# Patient Record
Sex: Female | Born: 1970 | Race: White | Hispanic: No | Marital: Married | Smoking: Never smoker
Health system: Southern US, Community
[De-identification: ages and names within clinical notes are randomized; demographics above are authoritative.]

## PROBLEM LIST (undated history)

## (undated) DIAGNOSIS — Z85528 Personal history of other malignant neoplasm of kidney: Secondary | ICD-10-CM

## (undated) DIAGNOSIS — C73 Malignant neoplasm of thyroid gland: Secondary | ICD-10-CM

## (undated) DIAGNOSIS — I10 Essential (primary) hypertension: Secondary | ICD-10-CM

## (undated) DIAGNOSIS — K219 Gastro-esophageal reflux disease without esophagitis: Secondary | ICD-10-CM

## (undated) DIAGNOSIS — E282 Polycystic ovarian syndrome: Secondary | ICD-10-CM

## (undated) DIAGNOSIS — N289 Disorder of kidney and ureter, unspecified: Secondary | ICD-10-CM

## (undated) DIAGNOSIS — N200 Calculus of kidney: Secondary | ICD-10-CM

## (undated) DIAGNOSIS — E079 Disorder of thyroid, unspecified: Secondary | ICD-10-CM

## (undated) DIAGNOSIS — C801 Malignant (primary) neoplasm, unspecified: Secondary | ICD-10-CM

## (undated) HISTORY — PX: PARTIAL NEPHRECTOMY: SHX414

## (undated) HISTORY — PX: ABDOMINAL HYSTERECTOMY: SHX81

## (undated) HISTORY — PX: CHOLECYSTECTOMY: SHX55

## (undated) HISTORY — PX: TONSILLECTOMY: SUR1361

## (undated) HISTORY — PX: OTHER SURGICAL HISTORY: SHX169

---

## 2006-11-24 ENCOUNTER — Emergency Department (HOSPITAL_COMMUNITY): Admission: EM | Admit: 2006-11-24 | Discharge: 2006-11-25 | Payer: Self-pay | Admitting: Emergency Medicine

## 2006-12-23 ENCOUNTER — Emergency Department (HOSPITAL_COMMUNITY): Admission: EM | Admit: 2006-12-23 | Discharge: 2006-12-24 | Payer: Self-pay | Admitting: *Deleted

## 2006-12-27 ENCOUNTER — Emergency Department (HOSPITAL_COMMUNITY): Admission: EM | Admit: 2006-12-27 | Discharge: 2006-12-27 | Payer: Self-pay | Admitting: *Deleted

## 2007-01-16 ENCOUNTER — Emergency Department (HOSPITAL_COMMUNITY): Admission: EM | Admit: 2007-01-16 | Discharge: 2007-01-16 | Payer: Self-pay | Admitting: Emergency Medicine

## 2007-02-05 ENCOUNTER — Emergency Department (HOSPITAL_COMMUNITY): Admission: EM | Admit: 2007-02-05 | Discharge: 2007-02-05 | Payer: Self-pay | Admitting: Emergency Medicine

## 2007-04-04 ENCOUNTER — Encounter (HOSPITAL_COMMUNITY): Payer: Self-pay | Admitting: Obstetrics and Gynecology

## 2007-04-04 ENCOUNTER — Inpatient Hospital Stay (HOSPITAL_COMMUNITY): Admission: RE | Admit: 2007-04-04 | Discharge: 2007-04-05 | Payer: Self-pay | Admitting: Obstetrics and Gynecology

## 2008-05-22 HISTORY — PX: TUBAL LIGATION: SHX77

## 2009-07-02 ENCOUNTER — Ambulatory Visit: Payer: Self-pay | Admitting: Diagnostic Radiology

## 2009-07-02 ENCOUNTER — Emergency Department (HOSPITAL_BASED_OUTPATIENT_CLINIC_OR_DEPARTMENT_OTHER): Admission: EM | Admit: 2009-07-02 | Discharge: 2009-07-02 | Payer: Self-pay | Admitting: Emergency Medicine

## 2009-11-10 ENCOUNTER — Ambulatory Visit: Payer: Self-pay | Admitting: Diagnostic Radiology

## 2009-11-10 ENCOUNTER — Emergency Department (HOSPITAL_BASED_OUTPATIENT_CLINIC_OR_DEPARTMENT_OTHER): Admission: EM | Admit: 2009-11-10 | Discharge: 2009-11-10 | Payer: Self-pay | Admitting: Emergency Medicine

## 2009-11-26 ENCOUNTER — Emergency Department (HOSPITAL_BASED_OUTPATIENT_CLINIC_OR_DEPARTMENT_OTHER): Admission: EM | Admit: 2009-11-26 | Discharge: 2009-11-26 | Payer: Self-pay | Admitting: Emergency Medicine

## 2009-11-26 ENCOUNTER — Ambulatory Visit: Payer: Self-pay | Admitting: Obstetrics and Gynecology

## 2009-11-26 ENCOUNTER — Inpatient Hospital Stay (HOSPITAL_COMMUNITY): Admission: AD | Admit: 2009-11-26 | Discharge: 2009-11-27 | Payer: Self-pay | Admitting: Obstetrics & Gynecology

## 2009-12-22 ENCOUNTER — Emergency Department (HOSPITAL_BASED_OUTPATIENT_CLINIC_OR_DEPARTMENT_OTHER): Admission: EM | Admit: 2009-12-22 | Discharge: 2009-12-22 | Payer: Self-pay | Admitting: Emergency Medicine

## 2009-12-22 ENCOUNTER — Ambulatory Visit: Payer: Self-pay | Admitting: Diagnostic Radiology

## 2010-01-18 ENCOUNTER — Inpatient Hospital Stay (HOSPITAL_COMMUNITY): Admission: AD | Admit: 2010-01-18 | Discharge: 2010-01-18 | Payer: Self-pay | Admitting: Gynecology

## 2010-01-18 ENCOUNTER — Ambulatory Visit: Payer: Self-pay | Admitting: Nurse Practitioner

## 2010-03-06 ENCOUNTER — Emergency Department (HOSPITAL_BASED_OUTPATIENT_CLINIC_OR_DEPARTMENT_OTHER): Admission: EM | Admit: 2010-03-06 | Discharge: 2010-03-06 | Payer: Self-pay | Admitting: Emergency Medicine

## 2010-08-03 LAB — URINALYSIS, ROUTINE W REFLEX MICROSCOPIC
Glucose, UA: NEGATIVE mg/dL
Hgb urine dipstick: NEGATIVE
Ketones, ur: NEGATIVE mg/dL
Nitrite: NEGATIVE
Protein, ur: NEGATIVE mg/dL
Specific Gravity, Urine: 1.024 (ref 1.005–1.030)
Urobilinogen, UA: 0.2 mg/dL (ref 0.0–1.0)
pH: 5.5 (ref 5.0–8.0)

## 2010-08-03 LAB — CBC
MCHC: 33.5 g/dL (ref 30.0–36.0)
WBC: 8.1 10*3/uL (ref 4.0–10.5)

## 2010-08-03 LAB — DIFFERENTIAL
Basophils Relative: 2 % — ABNORMAL HIGH (ref 0–1)
Lymphocytes Relative: 20 % (ref 12–46)
Lymphs Abs: 1.6 10*3/uL (ref 0.7–4.0)
Monocytes Absolute: 0.8 10*3/uL (ref 0.1–1.0)
Monocytes Relative: 10 % (ref 3–12)
Neutro Abs: 5.3 10*3/uL (ref 1.7–7.7)

## 2010-08-03 LAB — BASIC METABOLIC PANEL
BUN: 15 mg/dL (ref 6–23)
CO2: 27 mEq/L (ref 19–32)
Calcium: 9.3 mg/dL (ref 8.4–10.5)
Chloride: 103 mEq/L (ref 96–112)

## 2010-08-05 LAB — CBC
HCT: 37.3 % (ref 36.0–46.0)
HCT: 40.6 % (ref 36.0–46.0)
MCH: 28.6 pg (ref 26.0–34.0)
MCHC: 33.6 g/dL (ref 30.0–36.0)
MCHC: 33.7 g/dL (ref 30.0–36.0)
Platelets: 367 10*3/uL (ref 150–400)
RBC: 4.4 MIL/uL (ref 3.87–5.11)
RBC: 4.84 MIL/uL (ref 3.87–5.11)
RDW: 12.7 % (ref 11.5–15.5)
WBC: 11 10*3/uL — ABNORMAL HIGH (ref 4.0–10.5)

## 2010-08-05 LAB — COMPREHENSIVE METABOLIC PANEL
ALT: 55 U/L — ABNORMAL HIGH (ref 0–35)
AST: 34 U/L (ref 0–37)
Albumin: 4.5 g/dL (ref 3.5–5.2)
BUN: 16 mg/dL (ref 6–23)
CO2: 24 mEq/L (ref 19–32)
Calcium: 9.7 mg/dL (ref 8.4–10.5)
GFR calc Af Amer: >60 mL/min (ref >60)
GFR calc non Af Amer: >60 mL/min (ref >60)
Potassium: 3.9 mEq/L (ref 3.5–5.1)
Sodium: 141 mEq/L (ref 135–145)

## 2010-08-05 LAB — DIFFERENTIAL
Lymphocytes Relative: 22 % (ref 12–46)
Lymphs Abs: 3 10*3/uL (ref 0.7–4.0)
Monocytes Absolute: 0.8 10*3/uL (ref 0.1–1.0)
Monocytes Relative: 5 % (ref 3–12)
Neutro Abs: 10 10*3/uL — ABNORMAL HIGH (ref 1.7–7.7)
Neutrophils Relative %: 71 % (ref 43–77)

## 2010-08-05 LAB — URINALYSIS, ROUTINE W REFLEX MICROSCOPIC
Bilirubin Urine: NEGATIVE
Glucose, UA: NEGATIVE mg/dL
Hgb urine dipstick: NEGATIVE
Hgb urine dipstick: NEGATIVE
Ketones, ur: NEGATIVE mg/dL
Nitrite: NEGATIVE
Protein, ur: NEGATIVE mg/dL
Protein, ur: NEGATIVE mg/dL
Urobilinogen, UA: 0.2 mg/dL (ref 0.0–1.0)
pH: 5.5 (ref 5.0–8.0)

## 2010-08-07 LAB — COMPREHENSIVE METABOLIC PANEL
BUN: 10 mg/dL (ref 6–23)
Calcium: 9.3 mg/dL (ref 8.4–10.5)
GFR calc non Af Amer: >60 mL/min (ref >60)
Glucose, Bld: 95 mg/dL (ref 70–99)
Total Protein: 7.8 g/dL (ref 6.0–8.3)

## 2010-08-07 LAB — LIPASE, BLOOD: Lipase: 51 U/L (ref 23–300)

## 2010-08-07 LAB — CBC
HCT: 36.3 % (ref 36.0–46.0)
HCT: 38.8 % (ref 36.0–46.0)
Hemoglobin: 12.3 g/dL (ref 12.0–15.0)
MCH: 28.4 pg (ref 26.0–34.0)
MCH: 27.9 pg (ref 26.0–34.0)
MCHC: 33.8 g/dL (ref 30.0–36.0)
MCHC: 33 g/dL (ref 30.0–36.0)
MCV: 84 fL (ref 78.0–100.0)
MCV: 84.3 fL (ref 78.0–100.0)
Platelets: 322 10*3/uL (ref 150–400)
RBC: 4.32 MIL/uL (ref 3.87–5.11)
RDW: 12.4 % (ref 11.5–15.5)
WBC: 10.5 10*3/uL (ref 4.0–10.5)

## 2010-08-07 LAB — URINALYSIS, ROUTINE W REFLEX MICROSCOPIC
Bilirubin Urine: NEGATIVE
Glucose, UA: NEGATIVE mg/dL
Glucose, UA: NEGATIVE mg/dL
Hgb urine dipstick: NEGATIVE
Ketones, ur: NEGATIVE mg/dL
Nitrite: NEGATIVE
Protein, ur: NEGATIVE mg/dL
Protein, ur: NEGATIVE mg/dL
Urobilinogen, UA: 0.2 mg/dL (ref 0.0–1.0)
pH: 5.5 (ref 5.0–8.0)

## 2010-08-07 LAB — URINE CULTURE: Colony Count: 65000

## 2010-08-07 LAB — BASIC METABOLIC PANEL
BUN: 11 mg/dL (ref 6–23)
CO2: 29 mEq/L (ref 19–32)
Creatinine, Ser: 0.6 mg/dL (ref 0.4–1.2)
GFR calc Af Amer: >60 mL/min (ref >60)

## 2010-08-07 LAB — URINE MICROSCOPIC-ADD ON

## 2010-08-07 LAB — DIFFERENTIAL
Basophils Absolute: 0 10*3/uL (ref 0.0–0.1)
Basophils Relative: 2 % — ABNORMAL HIGH (ref 0–1)
Lymphs Abs: 2.5 10*3/uL (ref 0.7–4.0)
Monocytes Absolute: 0.7 10*3/uL (ref 0.1–1.0)
Monocytes Relative: 6 % (ref 3–12)
Monocytes Relative: 6 % (ref 3–12)
Neutro Abs: 7.2 10*3/uL (ref 1.7–7.7)
Neutro Abs: 7 10*3/uL (ref 1.7–7.7)
Neutrophils Relative %: 68 % (ref 43–77)
Neutrophils Relative %: 65 % (ref 43–77)

## 2010-08-07 LAB — WET PREP, GENITAL
Trich, Wet Prep: NONE SEEN
Yeast Wet Prep HPF POC: NONE SEEN

## 2010-08-11 LAB — URINALYSIS, ROUTINE W REFLEX MICROSCOPIC
Glucose, UA: NEGATIVE mg/dL
Ketones, ur: NEGATIVE mg/dL
Nitrite: NEGATIVE
Protein, ur: NEGATIVE mg/dL
Urobilinogen, UA: 0.2 mg/dL (ref 0.0–1.0)

## 2010-09-11 ENCOUNTER — Emergency Department (HOSPITAL_BASED_OUTPATIENT_CLINIC_OR_DEPARTMENT_OTHER)
Admission: EM | Admit: 2010-09-11 | Discharge: 2010-09-11 | Disposition: A | Discharge status: Home / Self Care | Payer: Self-pay | Attending: Emergency Medicine | Admitting: Emergency Medicine

## 2010-09-11 DIAGNOSIS — R300 Dysuria: Secondary | ICD-10-CM | POA: Insufficient documentation

## 2010-09-11 DIAGNOSIS — I10 Essential (primary) hypertension: Secondary | ICD-10-CM | POA: Insufficient documentation

## 2010-09-11 DIAGNOSIS — N390 Urinary tract infection, site not specified: Secondary | ICD-10-CM | POA: Insufficient documentation

## 2010-09-11 DIAGNOSIS — D72829 Elevated white blood cell count, unspecified: Secondary | ICD-10-CM | POA: Insufficient documentation

## 2010-09-11 DIAGNOSIS — K219 Gastro-esophageal reflux disease without esophagitis: Secondary | ICD-10-CM | POA: Insufficient documentation

## 2010-09-11 LAB — BASIC METABOLIC PANEL
BUN: 13 mg/dL (ref 6–23)
CO2: 26 mEq/L (ref 19–32)
Calcium: 9.4 mg/dL (ref 8.4–10.5)
GFR calc non Af Amer: >60 mL/min (ref >60)
Glucose, Bld: 135 mg/dL — ABNORMAL HIGH (ref 70–99)
Sodium: 141 mEq/L (ref 135–145)

## 2010-09-11 LAB — CBC
HCT: 37.3 % (ref 36.0–46.0)
Hemoglobin: 12.8 g/dL (ref 12.0–15.0)
MCHC: 34.3 g/dL (ref 30.0–36.0)
MCV: 80.7 fL (ref 78.0–100.0)
RDW: 13.2 % (ref 11.5–15.5)

## 2010-09-11 LAB — URINE MICROSCOPIC-ADD ON

## 2010-09-11 LAB — URINALYSIS, ROUTINE W REFLEX MICROSCOPIC
Bilirubin Urine: NEGATIVE
Ketones, ur: NEGATIVE mg/dL
Nitrite: NEGATIVE
Protein, ur: NEGATIVE mg/dL
Urobilinogen, UA: 0.2 mg/dL (ref 0.0–1.0)

## 2010-09-20 ENCOUNTER — Emergency Department (INDEPENDENT_AMBULATORY_CARE_PROVIDER_SITE_OTHER): Payer: Self-pay

## 2010-09-20 ENCOUNTER — Emergency Department (HOSPITAL_BASED_OUTPATIENT_CLINIC_OR_DEPARTMENT_OTHER)
Admission: EM | Admit: 2010-09-20 | Discharge: 2010-09-21 | Disposition: A | Discharge status: Home / Self Care | Payer: Self-pay | Attending: Emergency Medicine | Admitting: Emergency Medicine

## 2010-09-20 DIAGNOSIS — R109 Unspecified abdominal pain: Secondary | ICD-10-CM

## 2010-09-20 DIAGNOSIS — C649 Malignant neoplasm of unspecified kidney, except renal pelvis: Secondary | ICD-10-CM

## 2010-09-20 DIAGNOSIS — I10 Essential (primary) hypertension: Secondary | ICD-10-CM | POA: Insufficient documentation

## 2010-09-20 DIAGNOSIS — K7689 Other specified diseases of liver: Secondary | ICD-10-CM | POA: Insufficient documentation

## 2010-09-20 DIAGNOSIS — R748 Abnormal levels of other serum enzymes: Secondary | ICD-10-CM | POA: Insufficient documentation

## 2010-09-20 DIAGNOSIS — K219 Gastro-esophageal reflux disease without esophagitis: Secondary | ICD-10-CM | POA: Insufficient documentation

## 2010-09-20 LAB — COMPREHENSIVE METABOLIC PANEL
ALT: 65 U/L — ABNORMAL HIGH (ref 0–35)
Alkaline Phosphatase: 96 U/L (ref 39–117)
BUN: 17 mg/dL (ref 6–23)
CO2: 25 mEq/L (ref 19–32)
Chloride: 98 mEq/L (ref 96–112)
GFR calc non Af Amer: >60 mL/min (ref >60)
Glucose, Bld: 115 mg/dL — ABNORMAL HIGH (ref 70–99)
Potassium: 3.8 mEq/L (ref 3.5–5.1)
Sodium: 135 mEq/L (ref 135–145)
Total Bilirubin: 0.2 mg/dL — ABNORMAL LOW (ref 0.3–1.2)
Total Protein: 7.5 g/dL (ref 6.0–8.3)

## 2010-09-20 LAB — URINALYSIS, ROUTINE W REFLEX MICROSCOPIC
Bilirubin Urine: NEGATIVE
Hgb urine dipstick: NEGATIVE
Ketones, ur: NEGATIVE mg/dL
Nitrite: NEGATIVE
Specific Gravity, Urine: 1.011 (ref 1.005–1.030)
Urobilinogen, UA: 0.2 mg/dL (ref 0.0–1.0)
pH: 6 (ref 5.0–8.0)

## 2010-09-20 LAB — COMPREHENSIVE METABOLIC PANEL WITH GFR
AST: 46 U/L — ABNORMAL HIGH (ref 0–37)
Albumin: 4.1 g/dL (ref 3.5–5.2)
Calcium: 10.2 mg/dL (ref 8.4–10.5)
Creatinine, Ser: 0.7 mg/dL (ref 0.4–1.2)
GFR calc Af Amer: >60 mL/min (ref >60)

## 2010-09-20 LAB — DIFFERENTIAL
Basophils Absolute: 0 10*3/uL (ref 0.0–0.1)
Basophils Relative: 0 % (ref 0–1)
Eosinophils Absolute: 0.3 K/uL (ref 0.0–0.7)
Eosinophils Relative: 2 % (ref 0–5)
Lymphocytes Relative: 23 % (ref 12–46)
Lymphs Abs: 3.3 10*3/uL (ref 0.7–4.0)
Monocytes Absolute: 0.8 K/uL (ref 0.1–1.0)
Monocytes Relative: 5 % (ref 3–12)
Neutro Abs: 9.8 10*3/uL — ABNORMAL HIGH (ref 1.7–7.7)
Neutrophils Relative %: 69 % (ref 43–77)

## 2010-09-20 LAB — CBC
HCT: 36.1 % (ref 36.0–46.0)
Hemoglobin: 12.3 g/dL (ref 12.0–15.0)
MCH: 27.5 pg (ref 26.0–34.0)
MCHC: 34.1 g/dL (ref 30.0–36.0)
MCV: 80.8 fL (ref 78.0–100.0)
Platelets: 320 K/uL (ref 150–400)
RBC: 4.47 MIL/uL (ref 3.87–5.11)
RDW: 12.9 % (ref 11.5–15.5)
WBC: 14.2 10*3/uL — ABNORMAL HIGH (ref 4.0–10.5)

## 2010-09-20 LAB — URINE MICROSCOPIC-ADD ON

## 2010-09-20 LAB — LIPASE, BLOOD: Lipase: 32 U/L (ref 11–59)

## 2010-10-04 NOTE — Op Note (Signed)
NAMEREESHEMAH, NAZARYAN                 ACCOUNT NO.:  [id number]   MEDICAL RECORD NO.:  [id number]          PATIENT TYPE:  INP   LOCATION:  9320                          FACILITY:  [facility]   PHYSICIAN:  Zelphia Cairo, MD    DATE OF BIRTH:  1970-06-30   DATE OF PROCEDURE:  04/04/2007  DATE OF DISCHARGE:  04/05/2007                               OPERATIVE REPORT   PREOPERATIVE DIAGNOSIS:  Menorrhagia, bilateral ovarian cysts.   POSTOPERATIVE DIAGNOSES:  1. Menorrhagia.  2. Bilateral paratubal cysts, pathology pending.   PROCEDURE:  1. Intrauterine device removal.  2. Resection of bilateral paratubal cysts and laparoscopic-assisted      vaginal hysterectomy.   SURGEON:  Zelphia Cairo, M.D.   ASSISTANT:  Dineen Kid. Rana Snare, M.D.   ESTIMATED BLOOD LOSS:  250 cc.   COMPLICATIONS:  None.   ANESTHESIA:  General.   CONDITION:  Stable, extubated to recovery room.   PROCEDURE:  The patient was taken to the operating room where general  anesthesia was obtained.  She was placed in the dorsal lithotomy  position using Allen stirrups.  She was prepped and draped in a sterile  fashion.  The Foley catheter was inserted sterilely.  A bivalve speculum  was placed in the vagina, and a single-tooth tenaculum was placed on the  anterior lip of the cervix.  A ring forceps was used to grasp the IUD,  which was removed without difficulty.  A Hulka clamp was then inserted  through the cervix and onto the uterus for uterine manipulation.  The  single-tooth tenaculum and speculum were then removed, and our attention  was turned to the abdomen.  An infraumbilical skin incision was made  with the scalpel, and this was extended bluntly to the fascia using a  Lajoy clamp.  A 10 mm trocar was then inserted under direct  visualization once intraperitoneal placement was confirmed.  The abdomen  and pelvis were insufflated with CO2.  A second skin incision was made 2  cm above the symphysis pubis in the midline.  A  second 5 mm trocar was  advanced under direct visualization.  A blunt probe was placed, and the  patient was placed in Trendelenburg position.  Upon inspection of the  abdomen and pelvis, the patient was noted to have a dense wall of  omental adhesions to the anterior abdominal wall.  These were dissected  free using the gyrus.  Hemostasis was assured.  Our attention was then  turned to the pelvis.   Bilateral adnexa revealed normal-appearing ovaries, and 2.5 to 3 cm  paratubal cysts were noted adjacent to bilateral fimbriae.  Uterus  appeared normal.  The right adnexa was grasped with a blunt probe, and  the right and left paratubal cysts were dissected free from the  fallopian tube using the gyrus.  These were placed in the posterior cul  de sac.  Next, the uterus was then manipulated to the patient's right  side.  The left utero-ovarian ligament was grasped and cauterized and  cut with the gyrus.  This was extended down to the level  of the broad  ligament to the level of the round ligament.  This was repeated on the  opposite side.  Excellent hemostasis was noted bilaterally.  Our  attention was then turned to the vagina.  The Hulka clamp was removed.  A weighted speculum was placed in the vagina, and a cervix was grasped  with a tooth tenaculum.  The posterior cul de sac was then grasped with  an Allis clamp, and the posterior cul de sac was entered sharply using  Mayo scissors.  Once intraperitoneal placement was confirmed, the  weighted speculum was reinserted into the posterior cul de sac, and the  cervix was circumferential incised using the Bovie.  At this point, the  pubovesical cervical fascia was dissected anteriorly off the cervix  using a dry sponge stick and Mayo scissors.  Bilateral uterosacral  ligaments were grasped and cauterized using the Ligasure and cut with  Mayo scissors.  Next, the anterior cul de sac was entered sharply, and  the Deaver was placed in the  anterior cul de sac.  Bilateral cardinal  ligaments were then clamped and cauterized on both sides and transected  with Mayo scissors.  Uterine artery, then broad ligament were then  serially clamped with the Ligasure, cauterized, and transected using  Mayo scissors.  Excellent hemostasis was noted.  The uterus was then  delivered through the posterior cul de sac.  One ovarian cyst was  removed; however, the other ovarian cyst could not be palpated on exam.  The vaginal cuff angles were then closed using figure-of-eight stitches  of 0 Monocryl.  The vaginal cuff was closed using figure-of-eight  stitches of 0 Monocryl in an interrupted fashion.  Once the cuff was  closed and found to be hemostatic, our attention was returned to the  abdomen.  The camera was reinserted into the infraumbilical port, and  the cavity was inspected and found to be hemostatic.  The paratubal cyst  was identified, grasped with a blunt grasper.  It was incised in the  midline and drained of fluid.  It was removed through the 10 mm trocar.  The pelvis was then irrigated with normal saline, and all trocars and  instruments were removed from the abdomen.  The fascia of the  infraumbilical skin incision was closed with a figure-of-eight suture  using Vicryl.  The skin was reapproximated using 3-0 Vicryl.  The  patient tolerated the procedure well.  She was taken to the recovery  room in stable condition.      Zelphia Cairo, MD  Electronically Signed     GA/MEDQ  D:  04/05/2007  T:  04/06/2007  Job:  [id number]

## 2010-10-04 NOTE — H&P (Signed)
NAMEJENASIS, STRALEY                 ACCOUNT NO.:  [id number]   MEDICAL RECORD NO.:  [id number]          PATIENT TYPE:  AMB   LOCATION:  SDC                           FACILITY:  [facility]   PHYSICIAN:  Zelphia Cairo, MD    DATE OF BIRTH:  February 04, 1971   DATE OF ADMISSION:  DATE OF DISCHARGE:                              HISTORY & PHYSICAL   A 40 year old white female who presented to the office in October with  complaints of increasing dysmenorrhea and menorrhagia.  She had a Mirena  IUD placed in August of 2006; however, she continued to have recurrent  ovarian cysts since its insertion.  The patient has been unable to  tolerate oral contraceptive pills and other combination hormonal  contraceptives in the past due to migraine headaches with aura.  She and  her spouse do not desire future fertility.  She desires permanent  surgical management of menorrhagia and pelvic pain.   PAST MEDICAL HISTORY:  1. Depression, anxiety.  2. Reflux.  3. Migraine headaches.  4. Hypertension.   PAST SURGICAL HISTORY:  1. Three cesarean sections.  2. Cystoscopy.  3. Tonsillectomy.   ALLERGIES:  None.   MEDICATIONS:  1. Verapamil.  2. Prilosec.  3. Zoloft.   FAMILY HISTORY:  Noncontributory.   PHYSICAL EXAMINATION:  VITAL SIGNS:  Height 5 feet, 10 inches, weight  278.  Blood pressure 134/90.  HEAD/NECK:  Normal.  No thyromegaly.  HEART:  Regular rate and rhythm.  LUNGS:  Clear bilaterally.  ABDOMEN:  Soft, nontender, nondistended.  PELVIS:  Normal external female genitalia and urethral meatus.  Vaginal  and cervix are normal.  No lesions.  Uterus is mobile and nontender.  She does have bilateral adnexal fullness.   Pelvic ultrasound sounds right and left ovaries with simple-appearing  cysts.  Right ovarian cyst measures 3.8 cm.  Left ovarian cyst measures  3.5 cm.  There is no free fluid noted within the endometrial cavity.   ASSESSMENT AND PLAN:  A 40 year old with menorrhagia and  dysmenorrhea  who desires definitive surgical management.  We discussed risks,  benefits, and alternatives to hysterectomy, and informed consent was  obtained.  We will also perform bilateral ovarian cystectomy.      Zelphia Cairo, MD  Electronically Signed     GA/MEDQ  D:  04/03/2007  T:  04/03/2007  Job:  [id number]

## 2011-02-28 LAB — BASIC METABOLIC PANEL
BUN: 9
Creatinine, Ser: 0.62
GFR calc non Af Amer: >60
Glucose, Bld: 89
Potassium: 3.8

## 2011-02-28 LAB — TYPE AND SCREEN: ABO/RH(D): A POS

## 2011-02-28 LAB — CBC
HCT: 34.6 — ABNORMAL LOW
HCT: 43.4
Hemoglobin: 11.9 — ABNORMAL LOW
MCHC: 34.5
MCV: 84.3
Platelets: 355
RBC: 4.1
RDW: 12.5
RDW: 12.6

## 2011-03-06 LAB — URINALYSIS, ROUTINE W REFLEX MICROSCOPIC
Bilirubin Urine: NEGATIVE
Glucose, UA: NEGATIVE
Ketones, ur: NEGATIVE
Nitrite: NEGATIVE
Specific Gravity, Urine: 1.024
pH: 6

## 2011-03-06 LAB — PREGNANCY, URINE: Preg Test, Ur: NEGATIVE

## 2011-05-30 ENCOUNTER — Emergency Department (HOSPITAL_BASED_OUTPATIENT_CLINIC_OR_DEPARTMENT_OTHER)
Admission: EM | Admit: 2011-05-30 | Discharge: 2011-05-30 | Disposition: A | Discharge status: Home / Self Care | Payer: Self-pay | Attending: Emergency Medicine | Admitting: Emergency Medicine

## 2011-05-30 ENCOUNTER — Encounter: Payer: Self-pay | Admitting: *Deleted

## 2011-05-30 ENCOUNTER — Emergency Department (INDEPENDENT_AMBULATORY_CARE_PROVIDER_SITE_OTHER): Payer: Self-pay

## 2011-05-30 DIAGNOSIS — W108XXA Fall (on) (from) other stairs and steps, initial encounter: Secondary | ICD-10-CM | POA: Insufficient documentation

## 2011-05-30 DIAGNOSIS — E079 Disorder of thyroid, unspecified: Secondary | ICD-10-CM | POA: Insufficient documentation

## 2011-05-30 DIAGNOSIS — M25559 Pain in unspecified hip: Secondary | ICD-10-CM

## 2011-05-30 DIAGNOSIS — I10 Essential (primary) hypertension: Secondary | ICD-10-CM | POA: Insufficient documentation

## 2011-05-30 DIAGNOSIS — S20229A Contusion of unspecified back wall of thorax, initial encounter: Secondary | ICD-10-CM | POA: Insufficient documentation

## 2011-05-30 DIAGNOSIS — Z79899 Other long term (current) drug therapy: Secondary | ICD-10-CM | POA: Insufficient documentation

## 2011-05-30 DIAGNOSIS — IMO0002 Reserved for concepts with insufficient information to code with codable children: Secondary | ICD-10-CM | POA: Insufficient documentation

## 2011-05-30 DIAGNOSIS — S46911A Strain of unspecified muscle, fascia and tendon at shoulder and upper arm level, right arm, initial encounter: Secondary | ICD-10-CM

## 2011-05-30 DIAGNOSIS — K219 Gastro-esophageal reflux disease without esophagitis: Secondary | ICD-10-CM | POA: Insufficient documentation

## 2011-05-30 DIAGNOSIS — W19XXXA Unspecified fall, initial encounter: Secondary | ICD-10-CM

## 2011-05-30 HISTORY — DX: Polycystic ovarian syndrome: E28.2

## 2011-05-30 HISTORY — DX: Malignant (primary) neoplasm, unspecified: C80.1

## 2011-05-30 HISTORY — DX: Disorder of thyroid, unspecified: E07.9

## 2011-05-30 HISTORY — DX: Essential (primary) hypertension: I10

## 2011-05-30 HISTORY — DX: Gastro-esophageal reflux disease without esophagitis: K21.9

## 2011-05-30 HISTORY — DX: Calculus of kidney: N20.0

## 2011-05-30 HISTORY — DX: Disorder of kidney and ureter, unspecified: N28.9

## 2011-05-30 MED ORDER — OXYCODONE-ACETAMINOPHEN 5-325 MG PO TABS: 2.0000 | ORAL_TABLET | ORAL | Status: DC | PRN

## 2011-05-30 MED ORDER — OXYCODONE-ACETAMINOPHEN 5-325 MG PO TABS
2.0000 | ORAL_TABLET | Freq: Once | ORAL | Status: AC
  Administered 2011-05-30: 2 via ORAL
  Filled 2011-05-30: qty 2

## 2011-05-30 NOTE — ED Notes (Signed)
Tripped and fell down one carpeted basement step. Pain in her right shoulder. Has been taking Ibuprofen for pain without relief.

## 2011-05-30 NOTE — ED Provider Notes (Signed)
History     CSN: [id number]  Arrival date & time 05/30/11  1932   First MD Initiated Contact with Patient 05/30/11 2137      Chief Complaint  Patient presents with  . Fall    (Consider location/radiation/quality/duration/timing/severity/associated sxs/prior treatment) Patient is a 41 y.o. female presenting with fall. The history is provided by the patient. No language interpreter was used.  Fall The accident occurred yesterday. The fall occurred while walking. She fell from a height of 3 to 5 ft. She landed on carpet. There was no blood loss. The point of impact was the right hip. The pain is at a severity of 7/10. The pain is moderate. She was ambulatory at the scene. There was no entrapment after the fall. There was no drug use involved in the accident. There was no alcohol use involved in the accident. Pertinent negatives include no bowel incontinence and no hematuria. The symptoms are aggravated by activity. She has tried NSAIDs and rest for the symptoms.   Pt fell down steps yesterday and injured right low back.  Pt reports she grabbed with her right shoulder and has soreness in her shoulder. Pt did not hit shoulder Past Medical History  Diagnosis Date  . Cancer   . Renal disorder   . Thyroid disease   . Hypertension   . Polycystic ovarian syndrome   . GERD (gastroesophageal reflux disease)   . Kidney stones     Past Surgical History  Procedure Date  . Cesarean section   . Tonsillectomy   . Abdominal hysterectomy     No family history on file.  History  Substance Use Topics  . Smoking status: Never Smoker   . Smokeless tobacco: Not on file  . Alcohol Use: No    OB History    Grav Para Term Preterm Abortions TAB SAB Ect Mult Living                  Review of Systems  Gastrointestinal: Negative for bowel incontinence.  Genitourinary: Negative for hematuria.  All other systems reviewed and are negative.    Allergies  Demerol  Home Medications    Current Outpatient Rx  Name Route Sig Dispense Refill  . IBUPROFEN 800 MG PO TABS Oral Take 800 mg by mouth every 6 (six) hours as needed. For pain     . LISINOPRIL 20 MG PO TABS Oral Take 20 mg by mouth 2 (two) times daily.      Marland Kitchen METFORMIN HCL 500 MG PO TABS Oral Take 500 mg by mouth 2 (two) times daily with a meal.      . ADULT MULTIVITAMIN W/MINERALS CH Oral Take 1 tablet by mouth daily.      Marland Kitchen OMEPRAZOLE 20 MG PO CPDR Oral Take 20 mg by mouth daily.      Marland Kitchen SPIRONOLACTONE 25 MG PO TABS Oral Take 25 mg by mouth daily.        BP 154/106  Pulse 100  Temp(Src) 98.6 F (37 C) (Oral)  Resp 20  SpO2 100%  Physical Exam  Nursing note and vitals reviewed. Constitutional: She is oriented to person, place, and time. She appears well-developed and well-nourished.  HENT:  Head: Normocephalic and atraumatic.  Eyes: Conjunctivae are normal. Pupils are equal, round, and reactive to light.  Neck: Normal range of motion.  Cardiovascular: Normal rate.   Abdominal: Soft.  Musculoskeletal:       Tender right pelvis, flank area,  Pain with range of motion  of low back,  Right shoulder from,  No deformity  Neurological: She is alert and oriented to person, place, and time.  Skin: Skin is warm.    ED Course  Procedures (including critical care time)  Labs Reviewed - No data to display Dg Pelvis 1-2 Views  05/30/2011  *RADIOLOGY REPORT*  Clinical Data: Fall, right hip pain  PELVIS - 1-2 VIEW  Comparison: 12/22/2009  Findings: Intact pelvis and symmetric hips.  No fracture or malalignment.  No diastasis.  Normal SI joints.  IMPRESSION: No acute osseous finding.  Original Report Authenticated By: Judie Petit. Ruel Favors, M.D.     No diagnosis found.    MDM  Pt has a history of kidney cancer,  Pt recently diagnosed with thyroid cancer.  No fx on pelvis film.   I will treat with hydrocodone.  I advised follow up with primary.          Yolanda Matthews, [place] 05/30/11 2219  Yolanda Matthews, [place] 05/30/11  2228

## 2011-05-31 NOTE — ED Provider Notes (Signed)
Medical screening examination/treatment/procedure(s) were performed by non-physician practitioner and as supervising physician I was immediately available for consultation/collaboration.   Forbes Cellar, MD 05/31/11 1009

## 2011-06-04 ENCOUNTER — Encounter (HOSPITAL_BASED_OUTPATIENT_CLINIC_OR_DEPARTMENT_OTHER): Payer: Self-pay | Admitting: *Deleted

## 2011-06-04 ENCOUNTER — Emergency Department (INDEPENDENT_AMBULATORY_CARE_PROVIDER_SITE_OTHER): Payer: Self-pay

## 2011-06-04 ENCOUNTER — Emergency Department (HOSPITAL_BASED_OUTPATIENT_CLINIC_OR_DEPARTMENT_OTHER)
Admission: EM | Admit: 2011-06-04 | Discharge: 2011-06-04 | Disposition: A | Discharge status: Home / Self Care | Payer: Self-pay | Attending: Emergency Medicine | Admitting: Emergency Medicine

## 2011-06-04 DIAGNOSIS — K219 Gastro-esophageal reflux disease without esophagitis: Secondary | ICD-10-CM | POA: Insufficient documentation

## 2011-06-04 DIAGNOSIS — W108XXA Fall (on) (from) other stairs and steps, initial encounter: Secondary | ICD-10-CM

## 2011-06-04 DIAGNOSIS — S46009A Unspecified injury of muscle(s) and tendon(s) of the rotator cuff of unspecified shoulder, initial encounter: Secondary | ICD-10-CM

## 2011-06-04 DIAGNOSIS — M25519 Pain in unspecified shoulder: Secondary | ICD-10-CM

## 2011-06-04 DIAGNOSIS — I10 Essential (primary) hypertension: Secondary | ICD-10-CM | POA: Insufficient documentation

## 2011-06-04 DIAGNOSIS — S4980XA Other specified injuries of shoulder and upper arm, unspecified arm, initial encounter: Secondary | ICD-10-CM | POA: Insufficient documentation

## 2011-06-04 DIAGNOSIS — E079 Disorder of thyroid, unspecified: Secondary | ICD-10-CM | POA: Insufficient documentation

## 2011-06-04 DIAGNOSIS — S46909A Unspecified injury of unspecified muscle, fascia and tendon at shoulder and upper arm level, unspecified arm, initial encounter: Secondary | ICD-10-CM | POA: Insufficient documentation

## 2011-06-04 DIAGNOSIS — Y92009 Unspecified place in unspecified non-institutional (private) residence as the place of occurrence of the external cause: Secondary | ICD-10-CM | POA: Insufficient documentation

## 2011-06-04 DIAGNOSIS — Z79899 Other long term (current) drug therapy: Secondary | ICD-10-CM | POA: Insufficient documentation

## 2011-06-04 HISTORY — DX: Personal history of other malignant neoplasm of kidney: Z85.528

## 2011-06-04 HISTORY — DX: Malignant neoplasm of thyroid gland: C73

## 2011-06-04 MED ORDER — DIAZEPAM 5 MG PO TABS: 5.0000 mg | ORAL_TABLET | Freq: Two times a day (BID) | ORAL | Status: AC

## 2011-06-04 NOTE — ED Provider Notes (Signed)
History   This chart was scribed for Yolanda Christen, MD scribed by Magnus Sinning. The patient was seen in room MH10/MH10 seen at 15:15.     CSN: [id number]  Arrival date & time 06/04/11  1408   First MD Initiated Contact with Patient 06/04/11 1458      Chief Complaint  Patient presents with  . Fall  . Shoulder Injury    (Consider location/radiation/quality/duration/timing/severity/associated sxs/prior treatment) HPI Yolanda Matthews is a 41 y.o. female who presents to the Emergency Department complaining of intermittent gradually worsening right shoulder pain located over the scapula as a result from a fall on the basement stairs onset 6 days ago. Pt was seen in the ED 5 days ago following the fall and was treated with pain medication with moderate improvement.  Pt states that she slipped on the second to the last step to the bottom hitting her back ,causing injury to her lower back and  to her right shoulder during her attempt to catch herself on the armrail of the banister . Pt describes that her back feels "like balls, knots, and spasm-like." She explains that it is aggravated when she lifts her arm up over her head, such as when she is shampooing her hair. Per pt the pain is relieved by ice and heat treatments. She says that the ibuprofen relieved her back pain, but only shoulder mildly.  Pt has a hx of an open partial nephrectomy for kidney cancer and a recent diagnosis of  thyroid cancer.Denies seeing an orthopedic doctor recently. Has previously had cortisone injections for similar pain Past Medical History  Diagnosis Date  . Cancer   . Renal disorder   . Thyroid disease   . Hypertension   . Polycystic ovarian syndrome   . GERD (gastroesophageal reflux disease)   . Kidney stones   . Thyroid cancer   . History of kidney cancer     Past Surgical History  Procedure Date  . Cesarean section   . Tonsillectomy   . Abdominal hysterectomy   . Partial nephrectomy     No family  history on file.  History  Substance Use Topics  . Smoking status: Never Smoker   . Smokeless tobacco: Never Used  . Alcohol Use: No    Review of Systems  Constitutional: Negative.  Negative for fever and chills.  HENT: Negative.   Eyes: Negative.  Negative for discharge and redness.  Respiratory: Negative.  Negative for cough and shortness of breath.   Cardiovascular: Negative.  Negative for chest pain.  Gastrointestinal: Negative.  Negative for nausea, vomiting, abdominal pain and diarrhea.  Genitourinary: Negative.  Negative for dysuria and vaginal discharge.  Musculoskeletal: Negative.  Negative for back pain.  Skin: Negative.  Negative for color change and rash.  Neurological: Negative.  Negative for syncope and headaches.  Hematological: Negative.  Negative for adenopathy.  Psychiatric/Behavioral: Negative.  Negative for confusion.  All other systems reviewed and are negative.    Allergies  Demerol  Home Medications   Current Outpatient Rx  Name Route Sig Dispense Refill  . LISINOPRIL 20 MG PO TABS Oral Take 20 mg by mouth 2 (two) times daily.      Marland Kitchen METFORMIN HCL 500 MG PO TABS Oral Take 500 mg by mouth 2 (two) times daily with a meal.      . IBUPROFEN 800 MG PO TABS Oral Take 800 mg by mouth 3 (three) times daily as needed. For pain    . ADULT MULTIVITAMIN W/MINERALS  CH Oral Take 1 tablet by mouth daily.      Marland Kitchen OMEPRAZOLE 20 MG PO CPDR Oral Take 20 mg by mouth daily.      . OXYCODONE-ACETAMINOPHEN 5-325 MG PO TABS Oral Take 2 tablets by mouth every 4 (four) hours as needed for pain. 15 tablet 0  . SPIRONOLACTONE 25 MG PO TABS Oral Take 25 mg by mouth daily.        BP 135/91  Pulse 103  Temp(Src) 98.6 F (37 C) (Oral)  Resp 20  SpO2 98%  Physical Exam  Nursing note and vitals reviewed. Constitutional: She is oriented to person, place, and time. She appears well-developed and well-nourished. No distress.  HENT:  Head: Normocephalic and atraumatic.    Mouth/Throat: Oropharynx is clear and moist.  Eyes: EOM are normal. Pupils are equal, round, and reactive to light.  Neck: Normal range of motion. Neck supple. No tracheal deviation present.  Cardiovascular: Normal rate, regular rhythm and normal heart sounds.   Pulmonary/Chest: Effort normal and breath sounds normal. No respiratory distress.  Abdominal: Soft. She exhibits no distension.  Musculoskeletal: Normal range of motion. She exhibits tenderness. She exhibits no edema.       Mild tenderness over right scapula with full ROM over right shoulder. Normal strength until it's raised over head  Neurological: She is alert and oriented to person, place, and time. No sensory deficit.  Skin: Skin is warm and dry.  Psychiatric: She has a normal mood and affect. Her behavior is normal.    ED Course  Procedures (including critical care time) DIAGNOSTIC STUDIES: Oxygen Saturation is 98% on room air, normal by my interpretation.    COORDINATION OF CARE:  Dg Shoulder Right  06/04/2011  *RADIOLOGY REPORT*  Clinical Data: Right shoulder pain.  RIGHT SHOULDER - 2+ VIEW  Comparison: None.  Findings: The humerus is located and the acromioclavicular joint is intact.  No fracture.  Imaged right lung and ribs are unremarkable.  IMPRESSION: Negative exam.  Original Report Authenticated By: Bernadene Bell. D'ALESSIO, M.D.        MDM  I personally performed the services described in this documentation, which was scribed in my presence. The recorded information has been reviewed and considered.   Patient presents with no bony abnormalities on her x-ray.  Given the mechanism of the pulling motion when she used her arm to stop her fall with banister she likely has a muscle strain and possible tear in the rotator cuff or trapezius muscle.  Patient will be referred to Dr. Pearletha Forge or her prior orthopedic surgeon who she has not seen in the last few years.  A give the patient banged his she complains of knots and  tension in the area to assist with her symptoms she otherwise states ibuprofen is working well.        Yolanda Christen, MD 06/04/11 1537

## 2011-06-04 NOTE — ED Notes (Signed)
Larey Seat on steps 6 days ago- seen here Tuesday for back pain- today having increased pain in right shoulder- has been taking ibuprofen and pain without relief

## 2011-09-24 ENCOUNTER — Emergency Department (INDEPENDENT_AMBULATORY_CARE_PROVIDER_SITE_OTHER): Payer: Self-pay

## 2011-09-24 ENCOUNTER — Emergency Department (HOSPITAL_BASED_OUTPATIENT_CLINIC_OR_DEPARTMENT_OTHER)
Admission: EM | Admit: 2011-09-24 | Discharge: 2011-09-25 | Disposition: A | Discharge status: Home / Self Care | Payer: Self-pay | Attending: Emergency Medicine | Admitting: Emergency Medicine

## 2011-09-24 ENCOUNTER — Encounter (HOSPITAL_BASED_OUTPATIENT_CLINIC_OR_DEPARTMENT_OTHER): Payer: Self-pay

## 2011-09-24 DIAGNOSIS — Z79899 Other long term (current) drug therapy: Secondary | ICD-10-CM | POA: Insufficient documentation

## 2011-09-24 DIAGNOSIS — R319 Hematuria, unspecified: Secondary | ICD-10-CM

## 2011-09-24 DIAGNOSIS — K219 Gastro-esophageal reflux disease without esophagitis: Secondary | ICD-10-CM | POA: Insufficient documentation

## 2011-09-24 DIAGNOSIS — M549 Dorsalgia, unspecified: Secondary | ICD-10-CM

## 2011-09-24 DIAGNOSIS — R109 Unspecified abdominal pain: Secondary | ICD-10-CM

## 2011-09-24 DIAGNOSIS — I10 Essential (primary) hypertension: Secondary | ICD-10-CM | POA: Insufficient documentation

## 2011-09-24 DIAGNOSIS — R509 Fever, unspecified: Secondary | ICD-10-CM

## 2011-09-24 DIAGNOSIS — Z85528 Personal history of other malignant neoplasm of kidney: Secondary | ICD-10-CM | POA: Insufficient documentation

## 2011-09-24 DIAGNOSIS — E079 Disorder of thyroid, unspecified: Secondary | ICD-10-CM | POA: Insufficient documentation

## 2011-09-24 DIAGNOSIS — N12 Tubulo-interstitial nephritis, not specified as acute or chronic: Secondary | ICD-10-CM

## 2011-09-24 DIAGNOSIS — Z85850 Personal history of malignant neoplasm of thyroid: Secondary | ICD-10-CM | POA: Insufficient documentation

## 2011-09-24 LAB — URINALYSIS, ROUTINE W REFLEX MICROSCOPIC
Bilirubin Urine: NEGATIVE
Glucose, UA: NEGATIVE mg/dL
Ketones, ur: NEGATIVE mg/dL
Leukocytes, UA: NEGATIVE
Nitrite: NEGATIVE
Protein, ur: NEGATIVE mg/dL
Specific Gravity, Urine: 1.025 (ref 1.005–1.030)
Urobilinogen, UA: 0.2 mg/dL (ref 0.0–1.0)
pH: 6 (ref 5.0–8.0)

## 2011-09-24 LAB — URINE MICROSCOPIC-ADD ON

## 2011-09-24 LAB — COMPREHENSIVE METABOLIC PANEL
Albumin: 4.3 g/dL (ref 3.5–5.2)
Alkaline Phosphatase: 96 U/L (ref 39–117)
BUN: 12 mg/dL (ref 6–23)
CO2: 25 mEq/L (ref 19–32)
Chloride: 103 mEq/L (ref 96–112)
GFR calc Af Amer: >90 mL/min (ref >90)
GFR calc non Af Amer: >90 mL/min (ref >90)
Glucose, Bld: 99 mg/dL (ref 70–99)
Potassium: 3.8 mEq/L (ref 3.5–5.1)
Total Bilirubin: 0.2 mg/dL — ABNORMAL LOW (ref 0.3–1.2)

## 2011-09-24 LAB — CBC
HCT: 39.4 % (ref 36.0–46.0)
Hemoglobin: 13.3 g/dL (ref 12.0–15.0)
MCH: 27 pg (ref 26.0–34.0)
MCHC: 33.8 g/dL (ref 30.0–36.0)
MCV: 80.1 fL (ref 78.0–100.0)
Platelets: 362 K/uL (ref 150–400)
RBC: 4.92 MIL/uL (ref 3.87–5.11)
RDW: 13.1 % (ref 11.5–15.5)
WBC: 14.9 K/uL — ABNORMAL HIGH (ref 4.0–10.5)

## 2011-09-24 LAB — DIFFERENTIAL
Basophils Absolute: 0 K/uL (ref 0.0–0.1)
Basophils Relative: 0 % (ref 0–1)
Eosinophils Absolute: 0.2 K/uL (ref 0.0–0.7)
Eosinophils Relative: 1 % (ref 0–5)
Lymphocytes Relative: 24 % (ref 12–46)
Lymphs Abs: 3.6 10*3/uL (ref 0.7–4.0)
Monocytes Absolute: 1.1 10*3/uL — ABNORMAL HIGH (ref 0.1–1.0)
Monocytes Relative: 7 % (ref 3–12)
Neutro Abs: 10.1 10*3/uL — ABNORMAL HIGH (ref 1.7–7.7)
Neutrophils Relative %: 68 % (ref 43–77)

## 2011-09-24 LAB — COMPREHENSIVE METABOLIC PANEL WITH GFR
ALT: 28 U/L (ref 0–35)
AST: 24 U/L (ref 0–37)
Calcium: 9.4 mg/dL (ref 8.4–10.5)
Creatinine, Ser: 0.7 mg/dL (ref 0.50–1.10)
Sodium: 140 meq/L (ref 135–145)
Total Protein: 7.5 g/dL (ref 6.0–8.3)

## 2011-09-24 MED ORDER — CIPROFLOXACIN HCL 500 MG PO TABS: 500.0000 mg | ORAL_TABLET | Freq: Two times a day (BID) | ORAL | Status: AC

## 2011-09-24 MED ORDER — MORPHINE SULFATE 4 MG/ML IJ SOLN
4.0000 mg | Freq: Once | INTRAMUSCULAR | Status: AC
  Administered 2011-09-24: 4 mg via INTRAVENOUS
  Filled 2011-09-24: qty 1

## 2011-09-24 MED ORDER — HYDROCODONE-ACETAMINOPHEN 5-325 MG PO TABS: ORAL_TABLET | ORAL | Status: AC

## 2011-09-24 MED ORDER — DEXTROSE 5 % IV SOLN
1.0000 g | Freq: Once | INTRAVENOUS | Status: AC
  Administered 2011-09-24: 1 g via INTRAVENOUS
  Filled 2011-09-24: qty 10

## 2011-09-24 MED ORDER — ONDANSETRON HCL 4 MG/2ML IJ SOLN
4.0000 mg | Freq: Once | INTRAMUSCULAR | Status: AC
  Administered 2011-09-24: 4 mg via INTRAVENOUS
  Filled 2011-09-24: qty 2

## 2011-09-24 MED ORDER — IOHEXOL 300 MG/ML  SOLN
100.0000 mL | Freq: Once | INTRAMUSCULAR | Status: AC | PRN
  Administered 2011-09-24: 100 mL via INTRAVENOUS

## 2011-09-24 MED ORDER — ONDANSETRON 8 MG PO TBDP: 8.0000 mg | ORAL_TABLET | Freq: Two times a day (BID) | ORAL | Status: AC | PRN

## 2011-09-24 MED ORDER — SODIUM CHLORIDE 0.9 % IV SOLN: Freq: Once | INTRAVENOUS | Status: DC

## 2011-09-24 NOTE — ED Notes (Signed)
Pt states that she had onset of back pain and blood in urine and dysuria since yesterday.  Hx of UTI in the past, feels like UTI.

## 2011-09-24 NOTE — Discharge Instructions (Signed)
Hematuria, Adult Hematuria (blood in your urine) can be caused by a bladder infection (cystitis), kidney infection (pyelonephritis), prostate infection (prostatitis), or kidney stone. Infections will usually respond to antibiotics (medications which kill germs), and a kidney stone will usually pass through your urine without further treatment. If you were put on antibiotics, take all the medicine until gone. You may feel better in a few days, but take all of your medicine or the infection may not respond and become more difficult to treat. If antibiotics were not given, an infection did not cause the blood in the urine. A further work up to find out the reason may be needed. HOME CARE INSTRUCTIONS   Drink lots of fluid, 3 to 4 quarts a day. If you have been diagnosed with an infection, cranberry juice is especially recommended, in addition to large amounts of water.   Avoid caffeine, tea, and carbonated beverages, because they tend to irritate the bladder.   Avoid alcohol as it may irritate the prostate.   Only take over-the-counter or prescription medicines for pain, discomfort, or fever as directed by your caregiver.   If you have been diagnosed with a kidney stone follow your caregivers instructions regarding straining your urine to catch the stone.  TO PREVENT FURTHER INFECTIONS:  Empty the bladder often. Avoid holding urine for long periods of time.   After a bowel movement, women should cleanse front to back. Use each tissue only once.   Empty the bladder before and after sexual intercourse if you are a female.   Return to your caregiver if you develop back pain, fever, nausea (feeling sick to your stomach), vomiting, or your symptoms (problems) are not better in 3 days. Return sooner if you are getting worse.  If you have been requested to return for further testing make sure to keep your appointments. If an infection is not the cause of blood in your urine, X-rays may be required. Your  caregiver will discuss this with you. SEEK IMMEDIATE MEDICAL CARE IF:   You have a persistent fever over 102 F (38.9 C).   You develop severe vomiting and are unable to keep the medication down.   You develop severe back or abdominal pain despite taking your medications.   You begin passing a large amount of blood or clots in your urine.   You feel extremely weak or faint, or pass out.  MAKE SURE YOU:   Understand these instructions.   Will watch your condition.   Will get help right away if you are not doing well or get worse.  Document Released: 05/08/2005 Document Revised: 04/27/2011 Document Reviewed: 12/26/2007 [facility] Patient Information 2012 [place], [place].    Pyelonephritis, Adult Pyelonephritis is a kidney infection. In general, there are 2 main types of pyelonephritis:  Infections that come on quickly without any warning (acute pyelonephritis).   Infections that persist for a long period of time (chronic pyelonephritis).  CAUSES  Two main causes of pyelonephritis are:  Bacteria traveling from the bladder to the kidney. This is a problem especially in pregnant women. The urine in the bladder can become filled with bacteria from multiple causes, including:   Inflammation of the prostate gland (prostatitis).   Sexual intercourse in females.   Bladder infection (cystitis).   Bacteria traveling from the bloodstream to the tissue part of the kidney.  Problems that may increase your risk of getting a kidney infection include:  Diabetes.   Kidney stones or bladder stones.   Cancer.  Catheters placed in the bladder.   Other abnormalities of the kidney or ureter.  SYMPTOMS   Abdominal pain.   Pain in the side or flank area.   Fever.   Chills.   Upset stomach.   Blood in the urine (dark urine).   Frequent urination.   Strong or persistent urge to urinate.   Burning or stinging when urinating.  DIAGNOSIS  Your caregiver may diagnose your  kidney infection based on your symptoms. A urine sample may also be taken. TREATMENT  In general, treatment depends on how severe the infection is.   If the infection is mild and caught early, your caregiver may treat you with oral antibiotics and send you home.   If the infection is more severe, the bacteria may have gotten into the bloodstream. This will require intravenous (IV) antibiotics and a hospital stay. Symptoms may include:   High fever.   Severe flank pain.   Shaking chills.   Even after a hospital stay, your caregiver may require you to be on oral antibiotics for a period of time.   Other treatments may be required depending upon the cause of the infection.  HOME CARE INSTRUCTIONS   Take your antibiotics as directed. Finish them even if you start to feel better.   Make an appointment to have your urine checked to make sure the infection is gone.   Drink enough fluids to keep your urine clear or pale yellow.   Take medicines for the bladder if you have urgency and frequency of urination as directed by your caregiver.  SEEK IMMEDIATE MEDICAL CARE IF:   You have a fever.   You are unable to take your antibiotics or fluids.   You develop shaking chills.   You experience extreme weakness or fainting.   There is no improvement after 2 days of treatment.  MAKE SURE YOU:  Understand these instructions.   Will watch your condition.   Will get help right away if you are not doing well or get worse.  Document Released: 05/08/2005 Document Revised: 04/27/2011 Document Reviewed: 10/12/2010 [facility] Patient Information 2012 [place], [place].Pyelonephritis, Adult Pyelonephritis is a kidney infection. In general, there are 2 main types of pyelonephritis:  Infections that come on quickly without any warning (acute pyelonephritis).   Infections that persist for a long period of time (chronic pyelonephritis).  CAUSES  Two main causes of pyelonephritis are:  Bacteria  traveling from the bladder to the kidney. This is a problem especially in pregnant women. The urine in the bladder can become filled with bacteria from multiple causes, including:   Inflammation of the prostate gland (prostatitis).   Sexual intercourse in females.   Bladder infection (cystitis).   Bacteria traveling from the bloodstream to the tissue part of the kidney.  Problems that may increase your risk of getting a kidney infection include:  Diabetes.   Kidney stones or bladder stones.   Cancer.   Catheters placed in the bladder.   Other abnormalities of the kidney or ureter.  SYMPTOMS   Abdominal pain.   Pain in the side or flank area.   Fever.   Chills.   Upset stomach.   Blood in the urine (dark urine).   Frequent urination.   Strong or persistent urge to urinate.   Burning or stinging when urinating.  DIAGNOSIS  Your caregiver may diagnose your kidney infection based on your symptoms. A urine sample may also be taken. TREATMENT  In general, treatment depends on how severe  the infection is.   If the infection is mild and caught early, your caregiver may treat you with oral antibiotics and send you home.   If the infection is more severe, the bacteria may have gotten into the bloodstream. This will require intravenous (IV) antibiotics and a hospital stay. Symptoms may include:   High fever.   Severe flank pain.   Shaking chills.   Even after a hospital stay, your caregiver may require you to be on oral antibiotics for a period of time.   Other treatments may be required depending upon the cause of the infection.  HOME CARE INSTRUCTIONS   Take your antibiotics as directed. Finish them even if you start to feel better.   Make an appointment to have your urine checked to make sure the infection is gone.   Drink enough fluids to keep your urine clear or pale yellow.   Take medicines for the bladder if you have urgency and frequency of urination as  directed by your caregiver.  SEEK IMMEDIATE MEDICAL CARE IF:   You have a fever.   You are unable to take your antibiotics or fluids.   You develop shaking chills.   You experience extreme weakness or fainting.   There is no improvement after 2 days of treatment.  MAKE SURE YOU:  Understand these instructions.   Will watch your condition.   Will get help right away if you are not doing well or get worse.  Document Released: 05/08/2005 Document Revised: 04/27/2011 Document Reviewed: 10/12/2010 [facility] Patient Information 2012 [place], [place].     Narcotic and benzodiazepine use may cause drowsiness, slowed breathing or dependence.  Please use with caution and do not drive, operate machinery or watch young children alone while taking them.  Taking combinations of these medications or drinking alcohol will potentiate these effects.

## 2011-09-24 NOTE — ED Provider Notes (Signed)
History  This chart was scribed for Gavin Pound. Mayrene Bastarache, MD by Stevphen Meuse. This patient was seen in room MH05/MH05 and the patient's care was started at 7:46PM.  CSN: [id number]  Arrival date & time 09/24/11  1637   First MD Initiated Contact with Patient 09/24/11 1833      Chief Complaint  Patient presents with  . Back Pain  . Fever  . Hematuria     The history is provided by the patient. No language interpreter was used.    Yvanna Vidas is a 41 y.o. female who presents to the Emergency Department complaining of gradual onset, gradually worsening non radiating back pain. Pt reports that she has been feeling discomfort in her bladder since last Sunday. She described the discomfort as pressure-like and she felt as if her bladder was not emptying all the way. Pt also reports having blood in her urine, onset yesterday. Pt had a h/o kidney stones bilaterally, polycystic ovarian syndrome, kidney cancer and has had an open partial nephrectomy. Pt reports taking ibuprofen for her fever with relief but denies taking any medication for her current back pain. Pt reports that pressure aggravates her symptoms. Pt reports flank pain, hematuria, difficulty urination and fever but denies chills, dysuria, diarrhea, nausea, rectal pain and vomiting as associated symptoms. Pt denies a h/o smoking and alcohol use.     Past Medical History  Diagnosis Date  . Cancer   . Renal disorder   . Thyroid disease   . Hypertension   . Polycystic ovarian syndrome   . GERD (gastroesophageal reflux disease)   . Kidney stones   . Thyroid cancer   . History of kidney cancer     Past Surgical History  Procedure Date  . Cesarean section   . Tonsillectomy   . Abdominal hysterectomy   . Partial nephrectomy     History reviewed. No pertinent family history.  History  Substance Use Topics  . Smoking status: Never Smoker   . Smokeless tobacco: Never Used  . Alcohol Use: No     Review of Systems    Constitutional: Positive for fever. Negative for chills.  Gastrointestinal: Positive for abdominal pain. Negative for nausea, vomiting, diarrhea and rectal pain.  Genitourinary: Positive for hematuria, flank pain and difficulty urinating. Negative for dysuria.  All other systems reviewed and are negative.    Allergies  Demerol  Home Medications   Current Outpatient Rx  Name Route Sig Dispense Refill  . ALBUTEROL SULFATE HFA 108 (90 BASE) MCG/ACT IN AERS Inhalation Inhale 2 puffs into the lungs every 6 (six) hours as needed. For shortness of breath    . CIPROFLOXACIN HCL 500 MG PO TABS Oral Take 1 tablet (500 mg total) by mouth 2 (two) times daily. 20 tablet 0  . HYDROCODONE-ACETAMINOPHEN 5-325 MG PO TABS  1-2 tablets po q 6 hours prn moderate to severe pain 20 tablet 0  . IBUPROFEN 800 MG PO TABS Oral Take 800 mg by mouth 3 (three) times daily as needed. For pain    . LISINOPRIL 20 MG PO TABS Oral Take 20 mg by mouth 2 (two) times daily.      Marland Kitchen METFORMIN HCL 500 MG PO TABS Oral Take 500 mg by mouth 2 (two) times daily with a meal.      . ADULT MULTIVITAMIN W/MINERALS CH Oral Take 1 tablet by mouth daily.      Marland Kitchen OMEPRAZOLE 20 MG PO CPDR Oral Take 20 mg by mouth daily.      Marland Kitchen  ONDANSETRON 8 MG PO TBDP Oral Take 1 tablet (8 mg total) by mouth every 12 (twelve) hours as needed for nausea. 20 tablet 0  . SPIRONOLACTONE 25 MG PO TABS Oral Take 25 mg by mouth daily.        Triage Vitals: BP 158/105  Pulse 121  Temp(Src) 98.7 F (37.1 C) (Oral)  Resp 18  Ht 5\' 10"  (1.778 m)  Wt 255 lb (115.667 kg)  BMI 36.59 kg/m2  SpO2 100%  Physical Exam  Nursing note and vitals reviewed. Constitutional: She is oriented to person, place, and time. She appears well-developed and well-nourished.  HENT:  Head: Normocephalic and atraumatic.  Eyes: Conjunctivae and EOM are normal.  Neck: Normal range of motion. Neck supple.  Cardiovascular: Normal rate and regular rhythm.   Pulmonary/Chest: Effort  normal and breath sounds normal.  Abdominal: Soft. There is tenderness ( ).       mild tenderness in RLQ but not at Mcburney's point, no murphy's sign  Musculoskeletal: Normal range of motion. She exhibits no edema.  Neurological: She is alert and oriented to person, place, and time.  Skin: Skin is warm and dry. No rash noted.  Psychiatric: She has a normal mood and affect. Her behavior is normal.    ED Course  Procedures (including critical care time)  DIAGNOSTIC STUDIES: Oxygen Saturation is 100% on room air, normal by my interpretation.    COORDINATION OF CARE: 6:54PM: Discussed administering IV pain medicine and nausea medicine with pt and pt agreed  Labs Reviewed  URINALYSIS, ROUTINE W REFLEX MICROSCOPIC - Abnormal; Notable for the following:    Hgb urine dipstick LARGE (*)    All other components within normal limits  URINE MICROSCOPIC-ADD ON - Abnormal; Notable for the following:    Squamous Epithelial / LPF FEW (*)    Bacteria, UA MANY (*)    All other components within normal limits  CBC - Abnormal; Notable for the following:    WBC 14.9 (*)    All other components within normal limits  DIFFERENTIAL - Abnormal; Notable for the following:    Neutro Abs 10.1 (*)    Monocytes Absolute 1.1 (*)    All other components within normal limits  COMPREHENSIVE METABOLIC PANEL - Abnormal; Notable for the following:    Total Bilirubin 0.2 (*)    All other components within normal limits  URINE CULTURE   Ct Abdomen Pelvis W Contrast  09/24/2011  *RADIOLOGY REPORT*  Clinical Data: Abdominal pain.  Back pain.  Fever.  Hematuria. History of renal cell carcinoma.  Partial right nephrectomy.  CT ABDOMEN AND PELVIS WITH CONTRAST  Technique:  Multidetector CT imaging of the abdomen and pelvis was performed following the standard protocol during bolus administration of intravenous contrast.  Contrast: OMNIPAQUE IOHEXOL 300 MG/ML  SOLN  Comparison: 11/10/2009  Findings: 5 mm pulmonary  nodule in the left costophrenic sulcus is unchanged, compatible with scar.  The liver and spleen are unremarkable.  The stomach, duodenum, pancreas, gallbladder, adrenal glands, and left kidney are unremarkable.  Defect in the lower pole of the right kidney is compatible with the site of prior partial nephrectomy.  No evidence for local disease recurrence.  There is no retroperitoneal lymphadenopathy.  The right renal artery and vein are patent.  No abdominal aortic aneurysm.  There is no free fluid in the abdomen.  Imaging through the pelvis shows no free intraperitoneal fluid.  There is no pelvic sidewall lymphadenopathy.  The uterus is surgically absent.  There is no adnexal mass.  Bladder is decompressed.  No colonic diverticulitis.  The terminal ileum is normal.  The appendix is normal.  Bone windows reveal no worrisome lytic or sclerotic osseous lesions.  IMPRESSION: No acute findings in the abdomen or pelvis.  Small defect in the lower pole of the right kidney is compatible with the location of prior partial nephrectomy.  No changes to raise concern for local recurrence.  Original Report Authenticated By: ERIC A. MANSELL, M.D.   I reviewed the above film myself  1. Pyelonephritis   2. Hematuria       MDM  I personally performed the services described in this documentation, which was scribed in my presence. The recorded information has been reviewed and considered.  Despite having issues with several CT's in the past, given her right flank pain, frank hematruia, some concern for bleeding or re-occurrence of tumor. Risks of CT discussed with pt, who still agrees with obtaining CT scan here.  It was unremarkable per radiologist, will send culture, treat as pyelo given fever to 101 at home, pain, dysuria.  Pt instructed to follow up with PCP later this week if not improving.        Gavin Pound. Burel Kahre, MD 09/24/11 2317

## 2011-09-25 LAB — URINE CULTURE
Colony Count: 2000
Culture  Setup Time: 201305060249

## 2011-10-02 ENCOUNTER — Encounter (HOSPITAL_BASED_OUTPATIENT_CLINIC_OR_DEPARTMENT_OTHER): Payer: Self-pay | Admitting: *Deleted

## 2011-10-02 ENCOUNTER — Emergency Department (HOSPITAL_BASED_OUTPATIENT_CLINIC_OR_DEPARTMENT_OTHER)
Admission: EM | Admit: 2011-10-02 | Discharge: 2011-10-03 | Disposition: A | Discharge status: Home / Self Care | Payer: Self-pay | Attending: Emergency Medicine | Admitting: Emergency Medicine

## 2011-10-02 ENCOUNTER — Emergency Department (INDEPENDENT_AMBULATORY_CARE_PROVIDER_SITE_OTHER): Payer: Self-pay

## 2011-10-02 DIAGNOSIS — I10 Essential (primary) hypertension: Secondary | ICD-10-CM | POA: Insufficient documentation

## 2011-10-02 DIAGNOSIS — M549 Dorsalgia, unspecified: Secondary | ICD-10-CM | POA: Insufficient documentation

## 2011-10-02 DIAGNOSIS — Z79899 Other long term (current) drug therapy: Secondary | ICD-10-CM | POA: Insufficient documentation

## 2011-10-02 DIAGNOSIS — R079 Chest pain, unspecified: Secondary | ICD-10-CM | POA: Insufficient documentation

## 2011-10-02 DIAGNOSIS — Z85528 Personal history of other malignant neoplasm of kidney: Secondary | ICD-10-CM | POA: Insufficient documentation

## 2011-10-02 DIAGNOSIS — R209 Unspecified disturbances of skin sensation: Secondary | ICD-10-CM | POA: Insufficient documentation

## 2011-10-02 DIAGNOSIS — Z87442 Personal history of urinary calculi: Secondary | ICD-10-CM | POA: Insufficient documentation

## 2011-10-02 DIAGNOSIS — Z85850 Personal history of malignant neoplasm of thyroid: Secondary | ICD-10-CM | POA: Insufficient documentation

## 2011-10-02 DIAGNOSIS — Z7982 Long term (current) use of aspirin: Secondary | ICD-10-CM | POA: Insufficient documentation

## 2011-10-02 DIAGNOSIS — M25519 Pain in unspecified shoulder: Secondary | ICD-10-CM | POA: Insufficient documentation

## 2011-10-02 DIAGNOSIS — K219 Gastro-esophageal reflux disease without esophagitis: Secondary | ICD-10-CM | POA: Insufficient documentation

## 2011-10-02 MED ORDER — CYCLOBENZAPRINE HCL 10 MG PO TABS: 10.0000 mg | ORAL_TABLET | Freq: Two times a day (BID) | ORAL | Status: AC | PRN

## 2011-10-02 NOTE — Discharge Instructions (Signed)
Back Pain, Adult Low back pain is very common. About 1 in 5 people have back pain.The cause of low back pain is rarely dangerous. The pain often gets better over time.About half of people with a sudden onset of back pain feel better in just 2 weeks. About 8 in 10 people feel better by 6 weeks.  CAUSES Some common causes of back pain include:  Strain of the muscles or ligaments supporting the spine.   Wear and tear (degeneration) of the spinal discs.   Arthritis.   Direct injury to the back.  DIAGNOSIS Most of the time, the direct cause of low back pain is not known.However, back pain can be treated effectively even when the exact cause of the pain is unknown.Answering your caregiver's questions about your overall health and symptoms is one of the most accurate ways to make sure the cause of your pain is not dangerous. If your caregiver needs more information, he or she may order lab work or imaging tests (X-rays or MRIs).However, even if imaging tests show changes in your back, this usually does not require surgery. HOME CARE INSTRUCTIONS For many people, back pain returns.Since low back pain is rarely dangerous, it is often a condition that people can learn to manageon their own.   Remain active. It is stressful on the back to sit or stand in one place. Do not sit, drive, or stand in one place for more than 30 minutes at a time. Take short walks on level surfaces as soon as pain allows.Try to increase the length of time you walk each day.   Do not stay in bed.Resting more than 1 or 2 days can delay your recovery.   Do not avoid exercise or work.Your body is made to move.It is not dangerous to be active, even though your back may hurt.Your back will likely heal faster if you return to being active before your pain is gone.   Pay attention to your body when you bend and lift. Many people have less discomfortwhen lifting if they bend their knees, keep the load close to their  bodies,and avoid twisting. Often, the most comfortable positions are those that put less stress on your recovering back.   Find a comfortable position to sleep. Use a firm mattress and lie on your side with your knees slightly bent. If you lie on your back, put a pillow under your knees.   Only take over-the-counter or prescription medicines as directed by your caregiver. Over-the-counter medicines to reduce pain and inflammation are often the most helpful.Your caregiver may prescribe muscle relaxant drugs.These medicines help dull your pain so you can more quickly return to your normal activities and healthy exercise.   Put ice on the injured area.   Put ice in a plastic bag.   Place a towel between your skin and the bag.   Leave the ice on for 15 to 20 minutes, 3 to 4 times a day for the first 2 to 3 days. After that, ice and heat may be alternated to reduce pain and spasms.   Ask your caregiver about trying back exercises and gentle massage. This may be of some benefit.   Avoid feeling anxious or stressed.Stress increases muscle tension and can worsen back pain.It is important to recognize when you are anxious or stressed and learn ways to manage it.Exercise is a great option.  SEEK MEDICAL CARE IF:  You have pain that is not relieved with rest or medicine.   You have   pain that does not improve in 1 week.   You have new symptoms.   You are generally not feeling well.  SEEK IMMEDIATE MEDICAL CARE IF:   You have pain that radiates from your back into your legs.   You develop new bowel or bladder control problems.   You have unusual weakness or numbness in your arms or legs.   You develop nausea or vomiting.   You develop abdominal pain.   You feel faint.  Document Released: 05/08/2005 Document Revised: 04/27/2011 Document Reviewed: 09/26/2010 [employer] Patient Information 2012 [employer]. 

## 2011-10-02 NOTE — ED Provider Notes (Signed)
History     CSN: [id number]  Arrival date & time 10/02/11  2149   First MD Initiated Contact with Patient 10/02/11 2157      Chief Complaint  Patient presents with  . Shoulder Pain    (Consider location/radiation/quality/duration/timing/severity/associated sxs/prior treatment) HPI Comments: Pt states that she had the sharp pain back under her scapula the symptoms resolved for several hours and they started up again tonight:pt states that she had tingling in her left arm for a couple of minutes:pt states that nothing made the pain better or worse:pt states that she has had a cough for 2 weeks:pt denies cp, sob, n/v, diaphoresis  Patient is a 41 y.o. female presenting with back pain. The history is provided by the patient. No language interpreter was used.  Back Pain  This is a new problem. The current episode started 3 to 5 hours ago. The pain is present in the thoracic spine. The quality of the pain is described as stabbing. The pain does not radiate. The pain is mild. The pain is the same all the time. Associated symptoms include tingling. Pertinent negatives include no chest pain, no fever, no bowel incontinence, no perianal numbness, no bladder incontinence and no weakness. She has tried nothing for the symptoms. Risk factors include obesity.    Past Medical History  Diagnosis Date  . Cancer   . Renal disorder   . Thyroid disease   . Hypertension   . Polycystic ovarian syndrome   . GERD (gastroesophageal reflux disease)   . Kidney stones   . Thyroid cancer   . History of kidney cancer     Past Surgical History  Procedure Date  . Cesarean section   . Tonsillectomy   . Abdominal hysterectomy   . Partial nephrectomy     No family history on file.  History  Substance Use Topics  . Smoking status: Never Smoker   . Smokeless tobacco: Never Used  . Alcohol Use: No    OB History    Grav Para Term Preterm Abortions TAB SAB Ect Mult Living                  Review of  Systems  Constitutional: Negative for fever.  Respiratory: Negative.   Cardiovascular: Negative.  Negative for chest pain.  Gastrointestinal: Negative for bowel incontinence.  Genitourinary: Negative for bladder incontinence.  Musculoskeletal: Positive for back pain.  Neurological: Positive for tingling. Negative for weakness.    Allergies  Demerol  Home Medications   Current Outpatient Rx  Name Route Sig Dispense Refill  . ASPIRIN 325 MG PO TABS Oral Take 325 mg by mouth daily.    Marland Kitchen CIPROFLOXACIN HCL 500 MG PO TABS Oral Take 1 tablet (500 mg total) by mouth 2 (two) times daily. 20 tablet 0  . HYDROCHLOROTHIAZIDE 25 MG PO TABS Oral Take 25 mg by mouth daily.    . IBUPROFEN 800 MG PO TABS Oral Take 800 mg by mouth 3 (three) times daily as needed. For pain    . LISINOPRIL 20 MG PO TABS Oral Take 20 mg by mouth 2 (two) times daily.      . ADULT MULTIVITAMIN W/MINERALS CH Oral Take 1 tablet by mouth daily.      Marland Kitchen OMEPRAZOLE 20 MG PO CPDR Oral Take 20 mg by mouth daily.      . ALBUTEROL SULFATE HFA 108 (90 BASE) MCG/ACT IN AERS Inhalation Inhale 2 puffs into the lungs every 6 (six) hours as needed. For  shortness of breath    . HYDROCODONE-ACETAMINOPHEN 5-325 MG PO TABS  1-2 tablets po q 6 hours prn moderate to severe pain 20 tablet 0  . ONDANSETRON 8 MG PO TBDP Oral Take 1 tablet (8 mg total) by mouth every 12 (twelve) hours as needed for nausea. 20 tablet 0    BP 133/82  Pulse 93  Temp(Src) 98.5 F (36.9 C) (Oral)  Resp 18  SpO2 99%  Physical Exam  Nursing note and vitals reviewed. Constitutional: She is oriented to person, place, and time. She appears well-developed and well-nourished.  HENT:  Head: Normocephalic and atraumatic.  Eyes: Conjunctivae are normal.  Neck: Neck supple.  Cardiovascular: Normal rate and regular rhythm.   Pulmonary/Chest: Effort normal and breath sounds normal. She exhibits no tenderness.  Abdominal: Soft. Bowel sounds are normal.  Musculoskeletal:  Normal range of motion.       Pt has full rom of shoulder  Neurological: She is alert and oriented to person, place, and time.  Skin: Skin is warm and dry.  Psychiatric: She has a normal mood and affect.    ED Course  Procedures (including critical care time)  Labs Reviewed - No data to display Dg Chest 2 View  10/02/2011  *RADIOLOGY REPORT*  Clinical Data: Left-sided chest pain radiating to the back.  CHEST - 2 VIEW  Comparison: None.  Findings: The heart size and pulmonary vascularity are normal. The lungs appear clear and expanded without focal air space disease or consolidation. No blunting of the costophrenic angles.  Slight fibrosis or linear atelectasis in the left lung base.  No pneumothorax.  IMPRESSION: No evidence of active pulmonary disease.  Original Report Authenticated By: Marlon Pel, M.D.     1. Back pain       MDM  Likely musculoskeletal in nature:pt not having any neuro deficits:will treat symptomatically        Teressa Lower, NP 10/02/11 2357

## 2011-10-02 NOTE — ED Notes (Signed)
Left shoulder pain sudden onset this afternoon. Pain went away and came back this evening. She took Ibuprofen without relief. She is having tingling down her left arm. Took ASA 2 hours ago. No relief with time, rest and heat.

## 2011-10-03 MED ORDER — CYCLOBENZAPRINE HCL 10 MG PO TABS
5.0000 mg | ORAL_TABLET | Freq: Once | ORAL | Status: AC
  Administered 2011-10-03: 5 mg via ORAL
  Filled 2011-10-03: qty 1

## 2011-10-03 NOTE — ED Provider Notes (Signed)
Medical screening examination/treatment/procedure(s) were performed by non-physician practitioner and as supervising physician I was immediately available for consultation/collaboration.   Dayton Bailiff, MD 10/03/11 [phone]

## 2011-10-22 ENCOUNTER — Emergency Department (HOSPITAL_COMMUNITY): Payer: Self-pay

## 2011-10-22 ENCOUNTER — Emergency Department (HOSPITAL_COMMUNITY)
Admission: EM | Admit: 2011-10-22 | Discharge: 2011-10-22 | Disposition: A | Discharge status: Home / Self Care | Payer: Self-pay | Attending: Emergency Medicine | Admitting: Emergency Medicine

## 2011-10-22 ENCOUNTER — Encounter (HOSPITAL_COMMUNITY): Payer: Self-pay | Admitting: *Deleted

## 2011-10-22 DIAGNOSIS — X500XXA Overexertion from strenuous movement or load, initial encounter: Secondary | ICD-10-CM | POA: Insufficient documentation

## 2011-10-22 DIAGNOSIS — Z859 Personal history of malignant neoplasm, unspecified: Secondary | ICD-10-CM | POA: Insufficient documentation

## 2011-10-22 DIAGNOSIS — K219 Gastro-esophageal reflux disease without esophagitis: Secondary | ICD-10-CM | POA: Insufficient documentation

## 2011-10-22 DIAGNOSIS — Z79899 Other long term (current) drug therapy: Secondary | ICD-10-CM | POA: Insufficient documentation

## 2011-10-22 DIAGNOSIS — I10 Essential (primary) hypertension: Secondary | ICD-10-CM | POA: Insufficient documentation

## 2011-10-22 DIAGNOSIS — N289 Disorder of kidney and ureter, unspecified: Secondary | ICD-10-CM | POA: Insufficient documentation

## 2011-10-22 DIAGNOSIS — E282 Polycystic ovarian syndrome: Secondary | ICD-10-CM | POA: Insufficient documentation

## 2011-10-22 DIAGNOSIS — E079 Disorder of thyroid, unspecified: Secondary | ICD-10-CM | POA: Insufficient documentation

## 2011-10-22 DIAGNOSIS — Z87442 Personal history of urinary calculi: Secondary | ICD-10-CM | POA: Insufficient documentation

## 2011-10-22 DIAGNOSIS — S96919A Strain of unspecified muscle and tendon at ankle and foot level, unspecified foot, initial encounter: Secondary | ICD-10-CM

## 2011-10-22 DIAGNOSIS — Z85528 Personal history of other malignant neoplasm of kidney: Secondary | ICD-10-CM | POA: Insufficient documentation

## 2011-10-22 DIAGNOSIS — S93409A Sprain of unspecified ligament of unspecified ankle, initial encounter: Secondary | ICD-10-CM | POA: Insufficient documentation

## 2011-10-22 MED ORDER — HYDROCODONE-ACETAMINOPHEN 5-325 MG PO TABS: 1.0000 | ORAL_TABLET | Freq: Four times a day (QID) | ORAL | Status: AC | PRN

## 2011-10-22 MED ORDER — NAPROXEN 500 MG PO TABS: 500.0000 mg | ORAL_TABLET | Freq: Two times a day (BID) | ORAL | Status: DC

## 2011-10-22 NOTE — ED Notes (Signed)
Pt rolled ankle coming out out church this morning, pain in rt ankle

## 2011-10-22 NOTE — ED Notes (Signed)
Ortho tech at pt bedside to apply ASO and crutches.

## 2011-10-22 NOTE — Discharge Instructions (Signed)
Be sure to read and understand instructions below prior to leaving the hospital. If your symptoms persist without any improvement in 1 week it is reccommended that you follow up with orthopedics listed above. Use your pain medication as prescribed and do not operate heavy machinery while on pain medication. Note that your pain medication contains acetaminophen (Tylenol) & its is not reccommended that you use additional acetaminophen (Tylenol) while taking this medication.  Ankle Sprain  An ankle sprain is an injury to the ligaments that hold the ankle joint together. Your X-ray today showed no evidence of fracture, however keep all follow-up appointments with an orthopedic specialist to have follow-up X-rays, because as we discussed fractures may not appear until 3 days after the acute injury.    TREATMENT  Rest, ice, elevation, and compression are the basic modes of treatment.    HOME CARE INSTRUCTIONS  Apply ice to the sore area for 15 to 20 minutes, 3 to 4 times per day. Do this while you are awake for the first 2 days, or as directed. This can be stopped when the swelling goes away. Put the ice in a plastic bag and place a towel between the bag of ice and your skin.  Keep your leg elevated when possible to lessen swelling.  If your caregiver recommends crutches, use them as instructed for 1 week. Then, you may walk on your ankle weight bearing as tolerated.  You may take off your ankle stabilizer at night and to take a shower or bath. Wiggle your toes in the splint several times per day if you are able.  Do not drive a vehicle on pain medication. ACTIVITY:            - Weight bearing as tolerated            - Exercises should be limited to pain free range of motion            - Can start mobilization by tracing the alphabet with your foot in the air.       SEEK MEDICAL CARE IF:  You have an increase in bruising, swelling, or pain.  Your toes feel cold.  Pain relief is not achieved with  medications.  EMERGENCY:: Your toes are numb or blue or you have severe pain.  MAKE SURE YOU:  Understand these instructions.  Will watch your condition.  Will get help right away if you are not doing well or get worse   COLD THERAPY DIRECTIONS:  Ice or gel packs can be used to reduce both pain and swelling. Ice is the most helpful within the first 24 to 48 hours after an injury or flareup from overusing a muscle or joint.  Ice is effective, has very few side effects, and is safe for most people to use.   If you expose your skin to cold temperatures for too long or without the proper protection, you can damage your skin or nerves. Watch for signs of skin damage due to cold.   HOME CARE INSTRUCTIONS  Follow these tips to use ice and cold packs safely.  Place a dry or damp towel between the ice and skin. A damp towel will cool the skin more quickly, so you may need to shorten the time that the ice is used.  For a more rapid response, add gentle compression to the ice.  Ice for no more than 10 to 20 minutes at a time. The bonier the area you are icing, the less   time it will take to get the benefits of ice.  Check your skin after 5 minutes to make sure there are no signs of a poor response to cold or skin damage.  Rest 20 minutes or more in between uses.  Once your skin is numb, you can end your treatment. You can test numbness by very lightly touching your skin. The touch should be so light that you do not see the skin dimple from the pressure of your fingertip. When using ice, most people will feel these normal sensations in this order: cold, burning, aching, and numbness.  Do not use ice on someone who cannot communicate their responses to pain, such as small children or people with dementia.   HOW TO MAKE AN ICE PACK  To make an ice pack, do one of the following:  Place crushed ice or a bag of frozen vegetables in a sealable plastic bag. Squeeze out the excess air. Place this bag inside  another plastic bag. Slide the bag into a pillowcase or place a damp towel between your skin and the bag.  Mix 3 parts water with 1 part rubbing alcohol. Freeze the mixture in a sealable plastic bag. When you remove the mixture from the freezer, it will be slushy. Squeeze out the excess air. Place this bag inside another plastic bag. Slide the bag into a pillowcase or place a damp towel between your sk 

## 2011-10-22 NOTE — ED Notes (Signed)
Ortho tech called for ASO and crutches. 

## 2011-10-22 NOTE — ED Provider Notes (Signed)
History     CSN: [id number]  Arrival date & time 10/22/11  1512   First MD Initiated Contact with Patient 10/22/11 1630      Chief Complaint  Patient presents with  . Ankle Pain    (Consider location/radiation/quality/duration/timing/severity/associated sxs/prior treatment) Patient is a 41 y.o. female presenting with ankle pain. The history is provided by the patient.  Ankle Pain  The incident occurred 3 to 5 hours ago. The incident occurred in the street. Injury mechanism: inversion ankle roll when stepping down hill on concrete. The pain is present in the right ankle. The quality of the pain is described as aching. The pain is at a severity of 7/10. The pain is mild. The pain has been constant since onset. Associated symptoms include inability to bear weight. Pertinent negatives include no numbness, no loss of motion, no muscle weakness, no loss of sensation and no tingling. She reports no foreign bodies present. The symptoms are aggravated by bearing weight, activity and palpation. She has tried nothing for the symptoms.    Past Medical History  Diagnosis Date  . Cancer   . Renal disorder   . Thyroid disease   . Hypertension   . Polycystic ovarian syndrome   . GERD (gastroesophageal reflux disease)   . Kidney stones   . Thyroid cancer   . History of kidney cancer     Past Surgical History  Procedure Date  . Cesarean section   . Tonsillectomy   . Abdominal hysterectomy   . Partial nephrectomy     No family history on file.  History  Substance Use Topics  . Smoking status: Never Smoker   . Smokeless tobacco: Never Used  . Alcohol Use: No    OB History    Grav Para Term Preterm Abortions TAB SAB Ect Mult Living                  Review of Systems  Neurological: Negative for tingling and numbness.  All other systems reviewed and are negative.    Allergies  Demerol  Home Medications   Current Outpatient Rx  Name Route Sig Dispense Refill  . ALBUTEROL  SULFATE HFA 108 (90 BASE) MCG/ACT IN AERS Inhalation Inhale 2 puffs into the lungs every 6 (six) hours as needed. For shortness of breath    . HYDROCHLOROTHIAZIDE 25 MG PO TABS Oral Take 25 mg by mouth daily.    . IBUPROFEN 800 MG PO TABS Oral Take 800 mg by mouth 3 (three) times daily as needed. For pain    . LEVOTHYROXINE SODIUM 200 MCG PO TABS Oral Take 200 mcg by mouth daily.    Marland Kitchen LISINOPRIL 20 MG PO TABS Oral Take 20 mg by mouth 2 (two) times daily.      . ADULT MULTIVITAMIN W/MINERALS CH Oral Take 1 tablet by mouth daily.      Marland Kitchen OMEPRAZOLE 20 MG PO CPDR Oral Take 20 mg by mouth daily.        BP 153/100  Pulse 109  Temp(Src) 98.4 F (36.9 C) (Oral)  Resp 18  SpO2 97%  Physical Exam  Nursing note and vitals reviewed. Constitutional: She is oriented to person, place, and time. She appears well-developed and well-nourished. No distress.  HENT:  Head: Normocephalic and atraumatic.  Eyes: Conjunctivae and EOM are normal.  Neck: Normal range of motion.  Cardiovascular:       Intact distal pulses  Pulmonary/Chest: Effort normal.  Musculoskeletal: Normal range of motion.  Tenderness to palpation over medial malleolus.  Mild swelling of the ankle joint.  Pain with inversion and eversion of ankle.  Normal range of motion of toes.  No tenderness to palpation over metatarsal region.  Neurological: She is alert and oriented to person, place, and time.       Strength 5 out of 5 in extremities bilaterally. sensation intact  Skin: Skin is warm and dry. No rash noted. She is not diaphoretic.  Psychiatric: She has a normal mood and affect. Her behavior is normal.    ED Course  Procedures (including critical care time)  Labs Reviewed - No data to display Dg Ankle Complete Right  10/22/2011  *RADIOLOGY REPORT*  Clinical Data: Fall.  Twisting injury.  Pain.  RIGHT ANKLE - COMPLETE 3+ VIEW  Comparison: None.  Findings: Imaged bones, joints and soft tissues appear normal.  IMPRESSION:  Negative exam.  Original Report Authenticated By: Bernadene Bell. Maricela Curet, M.D.     No diagnosis found.    MDM  Ankle sprain  Patient X-Ray negative for obvious fracture or dislocation. Pain managed in ED. Pt advised to follow up with orthopedics if symptoms persist for possibility of missed fracture diagnosis. Patient given brace while in ED, conservative therapy recommended and discussed. Patient will be dc home & is agreeable with above plan.         Jaci Carrel, [place] 10/22/11 [phone]

## 2011-10-23 NOTE — ED Provider Notes (Signed)
Medical screening examination/treatment/procedure(s) were performed by non-physician practitioner and as supervising physician I was immediately available for consultation/collaboration.  Monike Bragdon, MD 10/23/11 0918 

## 2012-06-01 ENCOUNTER — Encounter (HOSPITAL_COMMUNITY): Payer: Self-pay | Admitting: *Deleted

## 2012-06-01 ENCOUNTER — Emergency Department (HOSPITAL_COMMUNITY)
Admission: EM | Admit: 2012-06-01 | Discharge: 2012-06-01 | Disposition: A | Discharge status: Home / Self Care | Payer: Self-pay | Attending: Emergency Medicine | Admitting: Emergency Medicine

## 2012-06-01 ENCOUNTER — Emergency Department (HOSPITAL_COMMUNITY): Payer: Self-pay

## 2012-06-01 DIAGNOSIS — I10 Essential (primary) hypertension: Secondary | ICD-10-CM | POA: Insufficient documentation

## 2012-06-01 DIAGNOSIS — Z8742 Personal history of other diseases of the female genital tract: Secondary | ICD-10-CM | POA: Insufficient documentation

## 2012-06-01 DIAGNOSIS — Z90710 Acquired absence of both cervix and uterus: Secondary | ICD-10-CM | POA: Insufficient documentation

## 2012-06-01 DIAGNOSIS — Z79899 Other long term (current) drug therapy: Secondary | ICD-10-CM | POA: Insufficient documentation

## 2012-06-01 DIAGNOSIS — E079 Disorder of thyroid, unspecified: Secondary | ICD-10-CM | POA: Insufficient documentation

## 2012-06-01 DIAGNOSIS — R109 Unspecified abdominal pain: Secondary | ICD-10-CM | POA: Insufficient documentation

## 2012-06-01 DIAGNOSIS — Z85850 Personal history of malignant neoplasm of thyroid: Secondary | ICD-10-CM | POA: Insufficient documentation

## 2012-06-01 DIAGNOSIS — Z905 Acquired absence of kidney: Secondary | ICD-10-CM | POA: Insufficient documentation

## 2012-06-01 DIAGNOSIS — R319 Hematuria, unspecified: Secondary | ICD-10-CM | POA: Insufficient documentation

## 2012-06-01 DIAGNOSIS — N289 Disorder of kidney and ureter, unspecified: Secondary | ICD-10-CM | POA: Insufficient documentation

## 2012-06-01 DIAGNOSIS — K219 Gastro-esophageal reflux disease without esophagitis: Secondary | ICD-10-CM | POA: Insufficient documentation

## 2012-06-01 DIAGNOSIS — Z87442 Personal history of urinary calculi: Secondary | ICD-10-CM | POA: Insufficient documentation

## 2012-06-01 DIAGNOSIS — Z85528 Personal history of other malignant neoplasm of kidney: Secondary | ICD-10-CM | POA: Insufficient documentation

## 2012-06-01 LAB — CBC WITH DIFFERENTIAL/PLATELET
Basophils Absolute: 0.1 10*3/uL (ref 0.0–0.1)
Basophils Relative: 0 % (ref 0–1)
Eosinophils Relative: 1 % (ref 0–5)
HCT: 41.4 % (ref 36.0–46.0)
MCHC: 33.8 g/dL (ref 30.0–36.0)
Monocytes Absolute: 1 10*3/uL (ref 0.1–1.0)
Neutro Abs: 9.2 10*3/uL — ABNORMAL HIGH (ref 1.7–7.7)
Platelets: 386 10*3/uL (ref 150–400)
RDW: 13.1 % (ref 11.5–15.5)

## 2012-06-01 LAB — URINALYSIS, MICROSCOPIC ONLY
Glucose, UA: NEGATIVE mg/dL
Specific Gravity, Urine: 1.023 (ref 1.005–1.030)

## 2012-06-01 LAB — COMPREHENSIVE METABOLIC PANEL
AST: 24 U/L (ref 0–37)
Albumin: 4.1 g/dL (ref 3.5–5.2)
Calcium: 9.7 mg/dL (ref 8.4–10.5)
Chloride: 97 mEq/L (ref 96–112)
Creatinine, Ser: 0.63 mg/dL (ref 0.50–1.10)
Sodium: 137 mEq/L (ref 135–145)

## 2012-06-01 MED ORDER — HYDROMORPHONE HCL PF 1 MG/ML IJ SOLN
1.0000 mg | Freq: Once | INTRAMUSCULAR | Status: AC
  Administered 2012-06-01: 1 mg via INTRAVENOUS
  Filled 2012-06-01: qty 1

## 2012-06-01 MED ORDER — HYDROCODONE-ACETAMINOPHEN 5-325 MG PO TABS: 1.0000 | ORAL_TABLET | Freq: Four times a day (QID) | ORAL | Status: DC | PRN

## 2012-06-01 MED ORDER — CIPROFLOXACIN HCL 500 MG PO TABS: 500.0000 mg | ORAL_TABLET | Freq: Two times a day (BID) | ORAL | Status: DC

## 2012-06-01 MED ORDER — ONDANSETRON HCL 4 MG/2ML IJ SOLN
4.0000 mg | Freq: Once | INTRAMUSCULAR | Status: AC
  Administered 2012-06-01: 4 mg via INTRAVENOUS
  Filled 2012-06-01: qty 2

## 2012-06-01 MED ORDER — CIPROFLOXACIN HCL 500 MG PO TABS
500.0000 mg | ORAL_TABLET | Freq: Once | ORAL | Status: AC
  Administered 2012-06-01: 500 mg via ORAL
  Filled 2012-06-01: qty 1

## 2012-06-01 NOTE — ED Notes (Signed)
Pt c/o R flank pain and hematuria, starting yesterday. Pt c/o n/v accompanying pain. Pt has hx of kidney stones in past. Pain not relieved by OTC ibuprofen.

## 2012-06-01 NOTE — ED Provider Notes (Signed)
History     CSN: [id number]  Arrival date & time 06/01/12  7829   First MD Initiated Contact with Patient 06/01/12 2113      Chief Complaint  Patient presents with  . Flank Pain    (Consider location/radiation/quality/duration/timing/severity/associated sxs/prior treatment) Patient is a 42 y.o. female presenting with flank pain. The history is provided by the patient.  Flank Pain Pertinent negatives include no chest pain, no abdominal pain, no headaches and no shortness of breath.  pt c/o acute onset right flank pain posteriorly radiating to right groin onset yesterday. Constant, dull, waxing and waning in intensity. Similar to prior kidney stone pain. No dysuria or hematuria. No fever or chills. No back/flank strain or injury. No fever or chills. Nausea. No vomiting. Having normal bms. Prior surgery includes hysterectomy and partial right nephrectomy re renal cell ca.     Past Medical History  Diagnosis Date  . Cancer   . Renal disorder   . Thyroid disease   . Hypertension   . Polycystic ovarian syndrome   . GERD (gastroesophageal reflux disease)   . Kidney stones   . Thyroid cancer   . History of kidney cancer     Past Surgical History  Procedure Date  . Cesarean section   . Tonsillectomy   . Abdominal hysterectomy   . Partial nephrectomy     History reviewed. No pertinent family history.  History  Substance Use Topics  . Smoking status: Never Smoker   . Smokeless tobacco: Never Used  . Alcohol Use: No    OB History    Grav Para Term Preterm Abortions TAB SAB Ect Mult Living                  Review of Systems  Constitutional: Negative for fever and chills.  HENT: Negative for neck pain.   Eyes: Negative for redness.  Respiratory: Negative for shortness of breath.   Cardiovascular: Negative for chest pain.  Gastrointestinal: Negative for abdominal pain.  Genitourinary: Positive for flank pain.  Musculoskeletal: Negative for back pain.  Skin:  Negative for rash.  Neurological: Negative for headaches.  Hematological: Does not bruise/bleed easily.  Psychiatric/Behavioral: Negative for confusion.    Allergies  Demerol  Home Medications   Current Outpatient Rx  Name  Route  Sig  Dispense  Refill  . HYDROCHLOROTHIAZIDE 25 MG PO TABS   Oral   Take 25 mg by mouth every morning.          . IBUPROFEN 800 MG PO TABS   Oral   Take 800 mg by mouth 3 (three) times daily as needed. For pain         . LEVOTHYROXINE SODIUM 150 MCG PO TABS   Oral   Take 150 mcg by mouth every morning.         . ADULT MULTIVITAMIN W/MINERALS CH   Oral   Take 1 tablet by mouth every morning.          Marland Kitchen OMEPRAZOLE 20 MG PO CPDR   Oral   Take 20 mg by mouth every morning.          Marland Kitchen SERTRALINE HCL 100 MG PO TABS   Oral   Take 100 mg by mouth every morning.           BP 160/106  Pulse 112  Temp 99.5 F (37.5 C) (Oral)  Resp 20  SpO2 98%  Physical Exam  Nursing note and vitals reviewed. Constitutional: She appears well-developed  and well-nourished. No distress.  Eyes: Conjunctivae normal are normal. No scleral icterus.  Neck: Neck supple. No tracheal deviation present.  Cardiovascular: Normal rate.   Pulmonary/Chest: Effort normal. No respiratory distress.  Abdominal: Soft. Normal appearance and bowel sounds are normal. She exhibits no distension and no mass. There is no tenderness. There is no rebound and no guarding.  Genitourinary:       No cva tenderness  Musculoskeletal: She exhibits no edema.  Neurological: She is alert.  Skin: Skin is warm and dry. No rash noted.  Psychiatric: She has a normal mood and affect.    ED Course  Procedures (including critical care time)  Labs Reviewed  URINALYSIS, MICROSCOPIC ONLY - Abnormal; Notable for the following:    APPearance CLOUDY (*)     Hgb urine dipstick LARGE (*)     Leukocytes, UA TRACE (*)     All other components within normal limits  CBC WITH DIFFERENTIAL    COMPREHENSIVE METABOLIC PANEL    Results for orders placed during the hospital encounter of 06/01/12  CBC WITH DIFFERENTIAL      Component Value Range   WBC 14.0 (*) 4.0 - 10.5 K/uL   RBC 5.13 (*) 3.87 - 5.11 MIL/uL   Hemoglobin 14.0  12.0 - 15.0 g/dL   HCT 40.9  81.1 - 91.4 %   MCV 80.7  78.0 - 100.0 fL   MCH 27.3  26.0 - 34.0 pg   MCHC 33.8  30.0 - 36.0 g/dL   RDW 78.2  95.6 - 21.3 %   Platelets 386  150 - 400 K/uL   Neutrophils Relative 65  43 - 77 %   Neutro Abs 9.2 (*) 1.7 - 7.7 K/uL   Lymphocytes Relative 26  12 - 46 %   Lymphs Abs 3.6  0.7 - 4.0 K/uL   Monocytes Relative 7  3 - 12 %   Monocytes Absolute 1.0  0.1 - 1.0 K/uL   Eosinophils Relative 1  0 - 5 %   Eosinophils Absolute 0.2  0.0 - 0.7 K/uL   Basophils Relative 0  0 - 1 %   Basophils Absolute 0.1  0.0 - 0.1 K/uL  COMPREHENSIVE METABOLIC PANEL      Component Value Range   Sodium 137  135 - 145 mEq/L   Potassium 3.7  3.5 - 5.1 mEq/L   Chloride 97  96 - 112 mEq/L   CO2 28  19 - 32 mEq/L   Glucose, Bld 100 (*) 70 - 99 mg/dL   BUN 12  6 - 23 mg/dL   Creatinine, Ser 0.86  0.50 - 1.10 mg/dL   Calcium 9.7  8.4 - 57.8 mg/dL   Total Protein 7.8  6.0 - 8.3 g/dL   Albumin 4.1  3.5 - 5.2 g/dL   AST 24  0 - 37 U/L   ALT 29  0 - 35 U/L   Alkaline Phosphatase 102  39 - 117 U/L   Total Bilirubin 0.2 (*) 0.3 - 1.2 mg/dL   GFR calc non Af Amer >90  >90 mL/min   GFR calc Af Amer >90  >90 mL/min  URINALYSIS, MICROSCOPIC ONLY      Component Value Range   Color, Urine YELLOW  YELLOW   APPearance CLOUDY (*) CLEAR   Specific Gravity, Urine 1.023  1.005 - 1.030   pH 7.0  5.0 - 8.0   Glucose, UA NEGATIVE  NEGATIVE mg/dL   Hgb urine dipstick LARGE (*) NEGATIVE  Bilirubin Urine NEGATIVE  NEGATIVE   Ketones, ur NEGATIVE  NEGATIVE mg/dL   Protein, ur NEGATIVE  NEGATIVE mg/dL   Urobilinogen, UA 0.2  0.0 - 1.0 mg/dL   Nitrite NEGATIVE  NEGATIVE   Leukocytes, UA TRACE (*) NEGATIVE   WBC, UA 0-2  <3 WBC/hpf   RBC / HPF TOO  NUMEROUS TO COUNT  <3 RBC/hpf   Squamous Epithelial / LPF RARE  RARE   Ct Abdomen Pelvis Wo Contrast  06/01/2012  *RADIOLOGY REPORT*  Clinical Data: Right flank pain.  CT ABDOMEN AND PELVIS WITHOUT CONTRAST  Technique:  Multidetector CT imaging of the abdomen and pelvis was performed following the standard protocol without intravenous contrast.  Comparison: 09/24/2011  Findings: Lung bases are clear.  No effusions.  Heart is normal size.  Diffuse fatty infiltration of the liver without visible focal abnormality on this unenhanced study.  Gallbladder, spleen, stomach, pancreas, adrenals and kidneys have an unremarkable unenhanced appearance.  No renal or ureteral stones.  No hydronephrosis.  Appendix is visualized and is normal.  Prior hysterectomy.  No adnexal masses. Bowel grossly unremarkable.  No free fluid, free air, or adenopathy.  No acute bony abnormality.  IMPRESSION: No renal or ureteral stones.  No hydronephrosis.  Normal appendix.  No acute findings.   Original Report Authenticated By: Charlett Nose, M.D.       MDM  Iv ns. Dilaudid iv. zofran iv.  Ct.  Reviewed nursing notes and prior charts for additional history.   Ct neg acute.   On recheck mild right cva tenderness. Given cva tenderness, hematuria, small le  -will cx urine and rx.  Recheck abd soft nt. No nv.          Suzi Roots, MD 06/01/12 [phone]

## 2012-06-03 LAB — URINE CULTURE

## 2012-06-13 ENCOUNTER — Emergency Department (HOSPITAL_COMMUNITY)
Admission: EM | Admit: 2012-06-13 | Discharge: 2012-06-13 | Disposition: A | Discharge status: Home / Self Care | Payer: Self-pay | Attending: Emergency Medicine | Admitting: Emergency Medicine

## 2012-06-13 ENCOUNTER — Encounter (HOSPITAL_COMMUNITY): Payer: Self-pay

## 2012-06-13 ENCOUNTER — Emergency Department (HOSPITAL_COMMUNITY): Payer: Self-pay

## 2012-06-13 DIAGNOSIS — E079 Disorder of thyroid, unspecified: Secondary | ICD-10-CM | POA: Insufficient documentation

## 2012-06-13 DIAGNOSIS — K219 Gastro-esophageal reflux disease without esophagitis: Secondary | ICD-10-CM | POA: Insufficient documentation

## 2012-06-13 DIAGNOSIS — R109 Unspecified abdominal pain: Secondary | ICD-10-CM

## 2012-06-13 DIAGNOSIS — Z8742 Personal history of other diseases of the female genital tract: Secondary | ICD-10-CM | POA: Insufficient documentation

## 2012-06-13 DIAGNOSIS — Z85850 Personal history of malignant neoplasm of thyroid: Secondary | ICD-10-CM | POA: Insufficient documentation

## 2012-06-13 DIAGNOSIS — R110 Nausea: Secondary | ICD-10-CM | POA: Insufficient documentation

## 2012-06-13 DIAGNOSIS — Z87448 Personal history of other diseases of urinary system: Secondary | ICD-10-CM | POA: Insufficient documentation

## 2012-06-13 DIAGNOSIS — I10 Essential (primary) hypertension: Secondary | ICD-10-CM | POA: Insufficient documentation

## 2012-06-13 DIAGNOSIS — Z3202 Encounter for pregnancy test, result negative: Secondary | ICD-10-CM | POA: Insufficient documentation

## 2012-06-13 DIAGNOSIS — R1033 Periumbilical pain: Secondary | ICD-10-CM | POA: Insufficient documentation

## 2012-06-13 DIAGNOSIS — Z87442 Personal history of urinary calculi: Secondary | ICD-10-CM | POA: Insufficient documentation

## 2012-06-13 DIAGNOSIS — R12 Heartburn: Secondary | ICD-10-CM | POA: Insufficient documentation

## 2012-06-13 DIAGNOSIS — Z85528 Personal history of other malignant neoplasm of kidney: Secondary | ICD-10-CM | POA: Insufficient documentation

## 2012-06-13 LAB — COMPREHENSIVE METABOLIC PANEL
Alkaline Phosphatase: 96 U/L (ref 39–117)
BUN: 11 mg/dL (ref 6–23)
GFR calc Af Amer: >90 mL/min (ref >90)
Glucose, Bld: 95 mg/dL (ref 70–99)
Potassium: 3.1 mEq/L — ABNORMAL LOW (ref 3.5–5.1)
Total Protein: 7.8 g/dL (ref 6.0–8.3)

## 2012-06-13 LAB — URINALYSIS, ROUTINE W REFLEX MICROSCOPIC
Ketones, ur: NEGATIVE mg/dL
Nitrite: NEGATIVE
Protein, ur: NEGATIVE mg/dL
Urobilinogen, UA: 0.2 mg/dL (ref 0.0–1.0)
pH: 7.5 (ref 5.0–8.0)

## 2012-06-13 LAB — CBC
HCT: 40.7 % (ref 36.0–46.0)
Hemoglobin: 13.8 g/dL (ref 12.0–15.0)
MCH: 27.2 pg (ref 26.0–34.0)
MCHC: 33.9 g/dL (ref 30.0–36.0)

## 2012-06-13 LAB — PREGNANCY, URINE: Preg Test, Ur: NEGATIVE

## 2012-06-13 LAB — URINE MICROSCOPIC-ADD ON

## 2012-06-13 MED ORDER — MORPHINE SULFATE 4 MG/ML IJ SOLN
6.0000 mg | Freq: Once | INTRAMUSCULAR | Status: AC
  Administered 2012-06-13: 6 mg via INTRAVENOUS
  Filled 2012-06-13: qty 2

## 2012-06-13 MED ORDER — ONDANSETRON HCL 4 MG/2ML IJ SOLN
4.0000 mg | Freq: Once | INTRAMUSCULAR | Status: AC
  Administered 2012-06-13: 4 mg via INTRAVENOUS
  Filled 2012-06-13: qty 2

## 2012-06-13 MED ORDER — IOHEXOL 300 MG/ML  SOLN
100.0000 mL | Freq: Once | INTRAMUSCULAR | Status: AC | PRN
  Administered 2012-06-13: 100 mL via INTRAVENOUS

## 2012-06-13 MED ORDER — OXYCODONE-ACETAMINOPHEN 5-325 MG PO TABS: 1.0000 | ORAL_TABLET | Freq: Four times a day (QID) | ORAL | Status: DC | PRN

## 2012-06-13 MED ORDER — IOHEXOL 300 MG/ML  SOLN: 25.0000 mL | Freq: Once | INTRAMUSCULAR | Status: DC | PRN

## 2012-06-13 MED ORDER — POTASSIUM CHLORIDE CRYS ER 20 MEQ PO TBCR
40.0000 meq | EXTENDED_RELEASE_TABLET | Freq: Once | ORAL | Status: AC
  Administered 2012-06-13: 40 meq via ORAL
  Filled 2012-06-13: qty 2

## 2012-06-13 MED ORDER — ONDANSETRON 4 MG PO TBDP: 4.0000 mg | ORAL_TABLET | Freq: Four times a day (QID) | ORAL | Status: DC | PRN

## 2012-06-13 NOTE — ED Provider Notes (Signed)
History     CSN: [id number]  Arrival date & time 06/13/12  1505   First MD Initiated Contact with Patient 06/13/12 1606      Chief Complaint  Patient presents with  . Abdominal Pain    (Consider location/radiation/quality/duration/timing/severity/associated sxs/prior treatment) HPI Comments: Bryley Chrisman is a 42 y.o. Female with PMHx of renal cell carcinoma, thyroid carcinoma, htn, GERD, and prediabetes that presents to the ER c/o abdominal pain.  She states that the pain started last night when she was having a bowel movement and was straining to pass stool.  She said it was a sudden sharp pain and she noticed a bulge coming from her lower mid abdomen that had not previously been there.   The pain since has become dull and aching. She has tried to take ibuprofen for it but with only mild improvement.  Lifting and straining make the pain worse.  She has never had anything like this before and states that the pain is a 5/10 currently.  She also states that she has had nausea, but no vomiting since the event.  She has only eaten saltine crackers today.  She denies any blood in her stool, as well as constipation or diarrhea.  She does state that her stools are always "loose or hard" and never "in between". She has also noticed a recent drastic increase in her GERD symptoms.  Patient is a 42 y.o. female presenting with abdominal pain. The history is provided by the patient.  Abdominal Pain The primary symptoms of the illness include abdominal pain and nausea. The primary symptoms of the illness do not include fever, fatigue, shortness of breath, vomiting, diarrhea, hematemesis, hematochezia, dysuria, vaginal discharge or vaginal bleeding. The current episode started 13 to 24 hours ago. The onset of the illness was sudden. The problem has not changed since onset. The patient states that she believes she is currently not pregnant. The patient has not had a change in bowel habit. Additional symptoms  associated with the illness include heartburn. Symptoms associated with the illness do not include chills, anorexia, diaphoresis, constipation, urgency, hematuria, frequency or back pain. Significant associated medical issues include GERD. Significant associated medical issues do not include PUD, inflammatory bowel disease, diabetes, sickle cell disease, gallstones, liver disease, substance abuse, diverticulitis, HIV or cardiac disease.    Past Medical History  Diagnosis Date  . Cancer   . Renal disorder   . Thyroid disease   . Hypertension   . Polycystic ovarian syndrome   . GERD (gastroesophageal reflux disease)   . Kidney stones   . Thyroid cancer   . History of kidney cancer     Past Surgical History  Procedure Date  . Cesarean section   . Tonsillectomy   . Abdominal hysterectomy   . Partial nephrectomy     No family history on file.  History  Substance Use Topics  . Smoking status: Never Smoker   . Smokeless tobacco: Never Used  . Alcohol Use: No    OB History    Grav Para Term Preterm Abortions TAB SAB Ect Mult Living                  Review of Systems  Constitutional: Negative.  Negative for fever, chills, diaphoresis and fatigue.  HENT: Negative.   Eyes: Negative.   Respiratory: Negative.  Negative for shortness of breath.   Cardiovascular: Negative.   Gastrointestinal: Positive for heartburn, nausea and abdominal pain. Negative for vomiting, diarrhea, constipation, blood in  stool, hematochezia, anorexia and hematemesis.       Increase in GERD like symptoms recently, in addition to nausea and abdominal pain  Genitourinary: Negative.  Negative for dysuria, urgency, frequency, hematuria, vaginal bleeding and vaginal discharge.  Musculoskeletal: Negative.  Negative for back pain.  Skin: Negative.   Neurological: Negative.   Hematological: Negative.   Psychiatric/Behavioral: Negative.   All other systems reviewed and are negative.    Allergies   Demerol  Home Medications   Current Outpatient Rx  Name  Route  Sig  Dispense  Refill  . HYDROCHLOROTHIAZIDE 25 MG PO TABS   Oral   Take 25 mg by mouth every morning.          . IBUPROFEN 200 MG PO TABS   Oral   Take 600 mg by mouth every 6 (six) hours as needed. For headache         . LEVOTHYROXINE SODIUM 150 MCG PO TABS   Oral   Take 150 mcg by mouth every morning.         . ADULT MULTIVITAMIN W/MINERALS CH   Oral   Take 1 tablet by mouth every morning.          Marland Kitchen OMEPRAZOLE 20 MG PO CPDR   Oral   Take 20 mg by mouth 2 (two) times daily.          . SERTRALINE HCL 100 MG PO TABS   Oral   Take 100 mg by mouth every morning.         Marland Kitchen CIPROFLOXACIN HCL 500 MG PO TABS   Oral   Take 1 tablet (500 mg total) by mouth 2 (two) times daily.   10 tablet   0     BP 149/95  Pulse 106  Temp 98 F (36.7 C) (Oral)  Resp 22  SpO2 99%  Physical Exam  Nursing note and vitals reviewed. Constitutional: She is oriented to person, place, and time. She appears well-developed and well-nourished. No distress.       Hypertensive   HENT:  Head: Normocephalic and atraumatic.  Right Ear: External ear normal.  Left Ear: External ear normal.  Nose: Nose normal.  Eyes: Conjunctivae normal and EOM are normal. Pupils are equal, round, and reactive to light. No scleral icterus.  Neck: Normal range of motion. Neck supple.  Cardiovascular: Regular rhythm and normal heart sounds.        Mild tachycardic  Pulmonary/Chest: Effort normal and breath sounds normal. No stridor.  Abdominal: Soft. Normal appearance and bowel sounds are normal. She exhibits no distension and no mass. There is tenderness in the periumbilical area. There is no rebound.         Some abdominal wall weakness palpated midline above the umbilicus.  Musculoskeletal: Normal range of motion.  Neurological: She is alert and oriented to person, place, and time.  Skin: Skin is warm and dry. She is not  diaphoretic.  Psychiatric: She has a normal mood and affect. Her behavior is normal.    ED Course  Procedures (including critical care time)  Labs Reviewed  CBC - Abnormal; Notable for the following:    WBC 13.3 (*)     All other components within normal limits  COMPREHENSIVE METABOLIC PANEL - Abnormal; Notable for the following:    Sodium 134 (*)     Potassium 3.1 (*)     Chloride 95 (*)     Total Bilirubin 0.2 (*)     All other components within  normal limits  PREGNANCY, URINE  URINALYSIS, ROUTINE W REFLEX MICROSCOPIC   Ct Abdomen Pelvis W Contrast  06/13/2012  *RADIOLOGY REPORT*  Clinical Data: Acute onset right flank and groin pain yesterday. History of nephrolithiasis.  Previous partial right nephrectomy for renal cell carcinoma.  CT ABDOMEN AND PELVIS WITH CONTRAST  Technique:  Multidetector CT imaging of the abdomen and pelvis was performed following the standard protocol during bolus administration of intravenous contrast.  Contrast: OMNIPAQUE IOHEXOL 300 MG/ML  SOLN  Comparison: 06/01/2012.  Findings: Stable mild diffuse low density of the liver relative to the spleen.  The spleen, pancreas, gallbladder, adrenal glands, kidneys, urinary bladder and ureters continue to have normal appearance.  Surgically absent uterus.  Interval 3.4 cm left ovarian cyst.  Additional small bilateral ovarian cysts.  Bilateral pelvic phleboliths.  No gastrointestinal abnormalities or enlarged lymph nodes.  Normal appearing appendix.  A 6 mm nodule at the left lateral lung base has not changed significantly since 07/02/2009, compatible with a benign process.  Mild lumbar and lower thoracic spine degenerative changes.  IMPRESSION:  1.  No acute abnormality. 2.  Stable mild hepatic steatosis. 3.  Interval 3.4 cm left ovarian cyst.  This does not require follow-up.   Original Report Authenticated By: Beckie Salts, M.D.      No diagnosis found.    MDM  Abdominal Pain Pt w hx of renal carcinoma  presents to ER w acute onset of supraumbilical abdominal pain, acute onset this AM while having bowel movement. BP 149/95  Pulse 106  Temp 98 F (36.7 C) (Oral)  Resp 22  SpO2 99% No peritoneal signs on exam or concerning associated symptoms. Labs and imaging pending disposition. Anticipate dc with close GI follow up as pt is non toxic non septic appearing in NAD.   CT w no acute abnormalities. Patient does not meet the SIRS or Sepsis criteria.  Patient discharged home with symptomatic treatment and given strict instructions for follow-up with their primary care physician or gastroenterologist.  I have also discussed reasons to return immediately to the ER.  Patient expresses understanding and agrees with plan.           Jaci Carrel, [place] 06/13/12 [id number]

## 2012-06-13 NOTE — ED Notes (Signed)
Pt c/o upper center abdominal pain, states "felt a pop when I used the bathroom", c/o nausea.

## 2012-06-14 NOTE — ED Provider Notes (Signed)
Medical screening examination/treatment/procedure(s) were performed by non-physician practitioner and as supervising physician I was immediately available for consultation/collaboration.   Laray Anger, DO 06/14/12 1630

## 2012-06-30 ENCOUNTER — Encounter (HOSPITAL_COMMUNITY): Payer: Self-pay | Admitting: Emergency Medicine

## 2012-06-30 ENCOUNTER — Emergency Department (HOSPITAL_COMMUNITY)
Admission: EM | Admit: 2012-06-30 | Discharge: 2012-06-30 | Disposition: A | Discharge status: Home / Self Care | Payer: Self-pay | Attending: Emergency Medicine | Admitting: Emergency Medicine

## 2012-06-30 ENCOUNTER — Emergency Department (HOSPITAL_COMMUNITY): Payer: Self-pay

## 2012-06-30 DIAGNOSIS — Z905 Acquired absence of kidney: Secondary | ICD-10-CM | POA: Insufficient documentation

## 2012-06-30 DIAGNOSIS — Z862 Personal history of diseases of the blood and blood-forming organs and certain disorders involving the immune mechanism: Secondary | ICD-10-CM | POA: Insufficient documentation

## 2012-06-30 DIAGNOSIS — E079 Disorder of thyroid, unspecified: Secondary | ICD-10-CM | POA: Insufficient documentation

## 2012-06-30 DIAGNOSIS — Z85850 Personal history of malignant neoplasm of thyroid: Secondary | ICD-10-CM | POA: Insufficient documentation

## 2012-06-30 DIAGNOSIS — K219 Gastro-esophageal reflux disease without esophagitis: Secondary | ICD-10-CM | POA: Insufficient documentation

## 2012-06-30 DIAGNOSIS — R319 Hematuria, unspecified: Secondary | ICD-10-CM | POA: Insufficient documentation

## 2012-06-30 DIAGNOSIS — R3589 Other polyuria: Secondary | ICD-10-CM | POA: Insufficient documentation

## 2012-06-30 DIAGNOSIS — R110 Nausea: Secondary | ICD-10-CM | POA: Insufficient documentation

## 2012-06-30 DIAGNOSIS — I10 Essential (primary) hypertension: Secondary | ICD-10-CM | POA: Insufficient documentation

## 2012-06-30 DIAGNOSIS — Z90710 Acquired absence of both cervix and uterus: Secondary | ICD-10-CM | POA: Insufficient documentation

## 2012-06-30 DIAGNOSIS — N200 Calculus of kidney: Secondary | ICD-10-CM | POA: Insufficient documentation

## 2012-06-30 DIAGNOSIS — Z8639 Personal history of other endocrine, nutritional and metabolic disease: Secondary | ICD-10-CM | POA: Insufficient documentation

## 2012-06-30 DIAGNOSIS — Z79899 Other long term (current) drug therapy: Secondary | ICD-10-CM | POA: Insufficient documentation

## 2012-06-30 DIAGNOSIS — R358 Other polyuria: Secondary | ICD-10-CM | POA: Insufficient documentation

## 2012-06-30 DIAGNOSIS — Z9889 Other specified postprocedural states: Secondary | ICD-10-CM | POA: Insufficient documentation

## 2012-06-30 DIAGNOSIS — Z3202 Encounter for pregnancy test, result negative: Secondary | ICD-10-CM | POA: Insufficient documentation

## 2012-06-30 DIAGNOSIS — Z85528 Personal history of other malignant neoplasm of kidney: Secondary | ICD-10-CM | POA: Insufficient documentation

## 2012-06-30 LAB — URINALYSIS, ROUTINE W REFLEX MICROSCOPIC
Bilirubin Urine: NEGATIVE
Glucose, UA: NEGATIVE mg/dL
Ketones, ur: NEGATIVE mg/dL
Protein, ur: NEGATIVE mg/dL
pH: 7 (ref 5.0–8.0)

## 2012-06-30 LAB — URINE MICROSCOPIC-ADD ON

## 2012-06-30 MED ORDER — HYDROMORPHONE HCL PF 1 MG/ML IJ SOLN
1.0000 mg | Freq: Once | INTRAMUSCULAR | Status: AC
  Administered 2012-06-30: 1 mg via INTRAVENOUS
  Filled 2012-06-30: qty 1

## 2012-06-30 MED ORDER — HYDROCODONE-ACETAMINOPHEN 5-325 MG PO TABS: 1.0000 | ORAL_TABLET | Freq: Four times a day (QID) | ORAL | Status: DC | PRN

## 2012-06-30 MED ORDER — ONDANSETRON HCL 4 MG/2ML IJ SOLN
4.0000 mg | Freq: Once | INTRAMUSCULAR | Status: AC
  Administered 2012-06-30: 4 mg via INTRAVENOUS
  Filled 2012-06-30: qty 2

## 2012-06-30 MED ORDER — SODIUM CHLORIDE 0.9 % IV BOLUS (SEPSIS)
1000.0000 mL | Freq: Once | INTRAVENOUS | Status: AC
  Administered 2012-06-30: 1000 mL via INTRAVENOUS

## 2012-06-30 NOTE — ED Provider Notes (Signed)
History     CSN: [id number]  Arrival date & time 06/30/12  1551   First MD Initiated Contact with Patient 06/30/12 1728      Chief Complaint  Patient presents with  . Flank Pain  . Hematuria    (Consider location/radiation/quality/duration/timing/severity/associated sxs/prior treatment) HPI  The patient presents with right flank pain, hematuria.  Symptoms began today, several hours prior to presentation without clear precipitant.  Since onset there has been blood in all urine attempts, and persistent right flank pain.  Nonradiating, sharp.  There is no associated vomiting, the patient has mild nausea.  No diarrhea, no fever.  The patient has a history of kidney stone, as well as renal cell carcinoma status post partial nephrectomy.  Past Medical History  Diagnosis Date  . Cancer   . Renal disorder   . Thyroid disease   . Hypertension   . Polycystic ovarian syndrome   . GERD (gastroesophageal reflux disease)   . Kidney stones   . Thyroid cancer   . History of kidney cancer     Past Surgical History  Procedure Laterality Date  . Cesarean section    . Tonsillectomy    . Abdominal hysterectomy    . Partial nephrectomy      No family history on file.  History  Substance Use Topics  . Smoking status: Never Smoker   . Smokeless tobacco: Never Used  . Alcohol Use: No    OB History   Grav Para Term Preterm Abortions TAB SAB Ect Mult Living                  Review of Systems  Constitutional:       Per HPI, otherwise negative  HENT:       Per HPI, otherwise negative  Respiratory:       Per HPI, otherwise negative  Cardiovascular:       Per HPI, otherwise negative  Gastrointestinal: Negative for vomiting.  Endocrine: Positive for polyuria.  Genitourinary:       Neg aside from HPI   Musculoskeletal:       Per HPI, otherwise negative  Skin: Negative.   Neurological: Negative for syncope.    Allergies  Demerol  Home Medications   Current Outpatient Rx   Name  Route  Sig  Dispense  Refill  . hydrochlorothiazide (HYDRODIURIL) 25 MG tablet   Oral   Take 25 mg by mouth every morning.          Marland Kitchen ibuprofen (ADVIL,MOTRIN) 200 MG tablet   Oral   Take 600 mg by mouth every 6 (six) hours as needed. For headache         . levothyroxine (SYNTHROID, LEVOTHROID) 150 MCG tablet   Oral   Take 150 mcg by mouth every morning.         . Multiple Vitamin (MULITIVITAMIN WITH MINERALS) TABS   Oral   Take 1 tablet by mouth every morning.          Marland Kitchen omeprazole (PRILOSEC) 20 MG capsule   Oral   Take 20 mg by mouth 2 (two) times daily.          . sertraline (ZOLOFT) 100 MG tablet   Oral   Take 100 mg by mouth every morning.           BP 134/64  Pulse 111  Temp(Src) 98.4 F (36.9 C) (Oral)  Resp 18  SpO2 100%  Physical Exam  Nursing note and vitals reviewed. Constitutional:  She is oriented to person, place, and time. She appears well-developed and well-nourished. No distress.  HENT:  Head: Normocephalic and atraumatic.  Eyes: Conjunctivae and EOM are normal.  Cardiovascular: Normal rate and regular rhythm.   Pulmonary/Chest: Effort normal and breath sounds normal. No stridor. No respiratory distress.  Abdominal: Soft. Normal appearance. She exhibits no distension. There is tenderness. There is CVA tenderness.  Musculoskeletal: She exhibits no edema.  Neurological: She is alert and oriented to person, place, and time. No cranial nerve deficit.  Skin: Skin is warm and dry.  Psychiatric: She has a normal mood and affect.    ED Course  Procedures (including critical care time)  Labs Reviewed  URINALYSIS, ROUTINE W REFLEX MICROSCOPIC - Abnormal; Notable for the following:    APPearance CLOUDY (*)    Hgb urine dipstick LARGE (*)    Leukocytes, UA SMALL (*)    All other components within normal limits  PREGNANCY, URINE  URINE MICROSCOPIC-ADD ON   Ct Abdomen Pelvis Wo Contrast  06/30/2012  *RADIOLOGY REPORT*  Clinical Data:  Right flank pain, hematuria  CT ABDOMEN AND PELVIS WITHOUT CONTRAST  Technique:  Multidetector CT imaging of the abdomen and pelvis was performed following the standard protocol without intravenous contrast.  Comparison: CT abdomen pelvis of 06/13/2012  Findings: The lung bases are clear.  As noted previous of the liver is somewhat low attenuation consistent with fatty infiltration with areas of sparing.  The gallbladder is contracted and no calcified gallstones are seen.  The pancreas is normal in size and the pancreatic duct is not dilated.  The adrenal glands and spleen are unremarkable.  The stomach is decompressed. There may be a tiny nonobstructing right renal calculus in the lower pole of no more than 2 mm in diameter.  No skeletal abnormality is seen.  The right kidney is noted the larger than the left, but this appears unchanged compared to the prior CT.  No hydronephrosis is seen and the ureters appear normal in caliber.  The abdominal aorta appears normal.  No adenopathy is seen.  The distal ureters are normal in caliber to the bladder.  The urinary bladder is decompressed.  The uterus has previously been resected.  Small left ovarian cysts are noted.  No free fluid is seen within the pelvis.  No abnormality of the colon is seen.  The terminal ileum and the appendix are unremarkable.  IMPRESSION:  1.  No hydronephrosis.  Only a tiny 2 mm right lower pole renal calculus is noted.  The ureters are normal in caliber. 2.  Probable fatty infiltration of the liver.   Original Report Authenticated By: Dwyane Dee, M.D.      No diagnosis found.  Update: I informed patient we'll results, including CT results.  The patient remained afebrile.  MDM  This patient with a history of kidney stone, renal cell carcinoma status post nephrectomy partial, now presents with flank pain, hematuria.  Patient's labs do not demonstrate a likely infection, and there is presence of a small stone demonstrated the CT.  Absent  distress, and with the patient's capacity to follow up with her urologist probably, she was discharged in stable condition with analgesics.      Gerhard Munch, MD 06/30/12 [phone]

## 2012-06-30 NOTE — ED Notes (Signed)
Pt escorted to discharge window. Pt verbalized understanding discharge instructions. In no acute distress.  

## 2012-06-30 NOTE — ED Notes (Addendum)
Pt states that she is having right sided flank pain and hematuria x 1 day.  Hx of kidney stones, renal cell carcinoma.

## 2012-06-30 NOTE — ED Notes (Signed)
Pt ambulatory to exam room with steady gait. Pt states the R flank pain started today with hematuria. Pt states she was on abx recently for same and "everything cleared up", and she was fine until today. Pt also reports n/v today.

## 2012-07-06 ENCOUNTER — Emergency Department (HOSPITAL_COMMUNITY): Payer: Self-pay

## 2012-07-06 ENCOUNTER — Encounter (HOSPITAL_COMMUNITY): Payer: Self-pay | Admitting: Emergency Medicine

## 2012-07-06 ENCOUNTER — Emergency Department (HOSPITAL_COMMUNITY)
Admission: EM | Admit: 2012-07-06 | Discharge: 2012-07-06 | Disposition: A | Discharge status: Home / Self Care | Payer: Self-pay | Attending: Emergency Medicine | Admitting: Emergency Medicine

## 2012-07-06 DIAGNOSIS — K219 Gastro-esophageal reflux disease without esophagitis: Secondary | ICD-10-CM | POA: Insufficient documentation

## 2012-07-06 DIAGNOSIS — N289 Disorder of kidney and ureter, unspecified: Secondary | ICD-10-CM | POA: Insufficient documentation

## 2012-07-06 DIAGNOSIS — N390 Urinary tract infection, site not specified: Secondary | ICD-10-CM | POA: Insufficient documentation

## 2012-07-06 DIAGNOSIS — R112 Nausea with vomiting, unspecified: Secondary | ICD-10-CM | POA: Insufficient documentation

## 2012-07-06 DIAGNOSIS — R509 Fever, unspecified: Secondary | ICD-10-CM | POA: Insufficient documentation

## 2012-07-06 DIAGNOSIS — Z905 Acquired absence of kidney: Secondary | ICD-10-CM | POA: Insufficient documentation

## 2012-07-06 DIAGNOSIS — I10 Essential (primary) hypertension: Secondary | ICD-10-CM | POA: Insufficient documentation

## 2012-07-06 DIAGNOSIS — Z8639 Personal history of other endocrine, nutritional and metabolic disease: Secondary | ICD-10-CM | POA: Insufficient documentation

## 2012-07-06 DIAGNOSIS — Z90710 Acquired absence of both cervix and uterus: Secondary | ICD-10-CM | POA: Insufficient documentation

## 2012-07-06 DIAGNOSIS — R109 Unspecified abdominal pain: Secondary | ICD-10-CM | POA: Insufficient documentation

## 2012-07-06 DIAGNOSIS — Z85850 Personal history of malignant neoplasm of thyroid: Secondary | ICD-10-CM | POA: Insufficient documentation

## 2012-07-06 DIAGNOSIS — R319 Hematuria, unspecified: Secondary | ICD-10-CM | POA: Insufficient documentation

## 2012-07-06 DIAGNOSIS — Z9889 Other specified postprocedural states: Secondary | ICD-10-CM | POA: Insufficient documentation

## 2012-07-06 DIAGNOSIS — R350 Frequency of micturition: Secondary | ICD-10-CM | POA: Insufficient documentation

## 2012-07-06 DIAGNOSIS — R3915 Urgency of urination: Secondary | ICD-10-CM | POA: Insufficient documentation

## 2012-07-06 DIAGNOSIS — R300 Dysuria: Secondary | ICD-10-CM | POA: Insufficient documentation

## 2012-07-06 DIAGNOSIS — N200 Calculus of kidney: Secondary | ICD-10-CM | POA: Insufficient documentation

## 2012-07-06 DIAGNOSIS — E079 Disorder of thyroid, unspecified: Secondary | ICD-10-CM | POA: Insufficient documentation

## 2012-07-06 DIAGNOSIS — Z862 Personal history of diseases of the blood and blood-forming organs and certain disorders involving the immune mechanism: Secondary | ICD-10-CM | POA: Insufficient documentation

## 2012-07-06 DIAGNOSIS — Z79899 Other long term (current) drug therapy: Secondary | ICD-10-CM | POA: Insufficient documentation

## 2012-07-06 DIAGNOSIS — Z85528 Personal history of other malignant neoplasm of kidney: Secondary | ICD-10-CM | POA: Insufficient documentation

## 2012-07-06 LAB — URINE MICROSCOPIC-ADD ON

## 2012-07-06 LAB — CBC WITH DIFFERENTIAL/PLATELET
Basophils Relative: 1 % (ref 0–1)
Eosinophils Absolute: 0.2 10*3/uL (ref 0.0–0.7)
Eosinophils Relative: 2 % (ref 0–5)
Hemoglobin: 13.5 g/dL (ref 12.0–15.0)
Lymphocytes Relative: 26 % (ref 12–46)
MCH: 27.3 pg (ref 26.0–34.0)
MCHC: 33.8 g/dL (ref 30.0–36.0)
Monocytes Absolute: 0.7 10*3/uL (ref 0.1–1.0)
Neutrophils Relative %: 64 % (ref 43–77)
Platelets: 386 10*3/uL (ref 150–400)
RBC: 4.95 MIL/uL (ref 3.87–5.11)

## 2012-07-06 LAB — URINALYSIS, ROUTINE W REFLEX MICROSCOPIC
Bilirubin Urine: NEGATIVE
Nitrite: NEGATIVE
Specific Gravity, Urine: 1.025 (ref 1.005–1.030)
Urobilinogen, UA: 0.2 mg/dL (ref 0.0–1.0)
pH: 6.5 (ref 5.0–8.0)

## 2012-07-06 LAB — POCT I-STAT, CHEM 8
HCT: 42 % (ref 36.0–46.0)
Hemoglobin: 14.3 g/dL (ref 12.0–15.0)
Potassium: 4.3 mEq/L (ref 3.5–5.1)
Sodium: 137 mEq/L (ref 135–145)
TCO2: 27 mmol/L (ref 0–100)

## 2012-07-06 MED ORDER — OXYCODONE-ACETAMINOPHEN 5-325 MG PO TABS
2.0000 | ORAL_TABLET | Freq: Once | ORAL | Status: AC
  Administered 2012-07-06: 2 via ORAL
  Filled 2012-07-06: qty 2

## 2012-07-06 MED ORDER — ONDANSETRON HCL 4 MG/2ML IJ SOLN
INTRAMUSCULAR | Status: AC
  Filled 2012-07-06: qty 2

## 2012-07-06 MED ORDER — HYDROMORPHONE HCL PF 1 MG/ML IJ SOLN
1.0000 mg | Freq: Once | INTRAMUSCULAR | Status: AC
  Administered 2012-07-06: 1 mg via INTRAVENOUS
  Filled 2012-07-06: qty 1

## 2012-07-06 MED ORDER — ONDANSETRON HCL 4 MG/2ML IJ SOLN
4.0000 mg | Freq: Once | INTRAMUSCULAR | Status: AC
  Administered 2012-07-06: 4 mg via INTRAVENOUS
  Filled 2012-07-06: qty 2

## 2012-07-06 MED ORDER — SODIUM CHLORIDE 0.9 % IV BOLUS (SEPSIS)
1000.0000 mL | Freq: Once | INTRAVENOUS | Status: AC
  Administered 2012-07-06: 1000 mL via INTRAVENOUS

## 2012-07-06 MED ORDER — ONDANSETRON HCL 4 MG PO TABS: 4.0000 mg | ORAL_TABLET | Freq: Four times a day (QID) | ORAL | Status: DC

## 2012-07-06 MED ORDER — DIPHENHYDRAMINE HCL 50 MG/ML IJ SOLN
INTRAMUSCULAR | Status: AC
  Administered 2012-07-06: 12.5 mg via INTRAVENOUS
  Filled 2012-07-06: qty 1

## 2012-07-06 MED ORDER — DIPHENHYDRAMINE HCL 50 MG/ML IJ SOLN
12.5000 mg | Freq: Once | INTRAMUSCULAR | Status: AC
  Administered 2012-07-06: 12.5 mg via INTRAVENOUS

## 2012-07-06 MED ORDER — HYDROCODONE-ACETAMINOPHEN 5-325 MG PO TABS: 1.0000 | ORAL_TABLET | Freq: Four times a day (QID) | ORAL | Status: DC | PRN

## 2012-07-06 MED ORDER — DEXTROSE 5 % IV SOLN
1.0000 g | Freq: Once | INTRAVENOUS | Status: AC
  Administered 2012-07-06: 1 g via INTRAVENOUS
  Filled 2012-07-06: qty 10

## 2012-07-06 MED ORDER — MORPHINE SULFATE 4 MG/ML IJ SOLN
4.0000 mg | Freq: Once | INTRAMUSCULAR | Status: AC
  Administered 2012-07-06: 4 mg via INTRAVENOUS
  Filled 2012-07-06: qty 1

## 2012-07-06 MED ORDER — KETOROLAC TROMETHAMINE 30 MG/ML IJ SOLN
30.0000 mg | Freq: Once | INTRAMUSCULAR | Status: AC
  Administered 2012-07-06: 30 mg via INTRAVENOUS
  Filled 2012-07-06: qty 1

## 2012-07-06 MED ORDER — NITROFURANTOIN MONOHYD MACRO 100 MG PO CAPS: 100.0000 mg | ORAL_CAPSULE | Freq: Two times a day (BID) | ORAL | Status: DC

## 2012-07-06 MED ORDER — ONDANSETRON HCL 4 MG/2ML IJ SOLN
4.0000 mg | Freq: Once | INTRAMUSCULAR | Status: AC
  Administered 2012-07-06: 4 mg via INTRAVENOUS

## 2012-07-06 NOTE — ED Provider Notes (Signed)
History     CSN: [id number]  Arrival date & time 07/06/12  1310   First MD Initiated Contact with Patient 07/06/12 1332      Chief Complaint  Patient presents with  . Nephrolithiasis    (Consider location/radiation/quality/duration/timing/severity/associated sxs/prior treatment) HPI  The patient has a history of kidney stone, as well as renal cell carcinoma status post partial nephrectomy, who was diagnosed with having a 2 mm stone to her right ureter with no evidence of hydronephrosis, or UTI on February 9. She reports she continues to have blood in the urine or, but is improving. For the past 2-3 days she is having increased dysuria with urinary frequency, and urgency, and is having a fever as high as 102 last night. She has been taking ibuprofen, alternate with the pain medication, last dose of ibuprofen was at noon today. She continues to endorse fever, chills, nausea and occasional nonbloody nonbilious vomit when the pain intensified. Pain is currently 4/10, radiates down to her low abdomen from her right flank. He denies headache, sneezing, coughing, sore throat, runny nose, chest pain, shortness of breath. Patient states she was referred to urologist and has an appointment on Friday but it was canceled due to inclement weather.  Past Medical History  Diagnosis Date  . Cancer   . Renal disorder   . Thyroid disease   . Hypertension   . Polycystic ovarian syndrome   . GERD (gastroesophageal reflux disease)   . Kidney stones   . Thyroid cancer   . History of kidney cancer     Past Surgical History  Procedure Laterality Date  . Cesarean section    . Tonsillectomy    . Abdominal hysterectomy    . Partial nephrectomy      History reviewed. No pertinent family history.  History  Substance Use Topics  . Smoking status: Never Smoker   . Smokeless tobacco: Never Used  . Alcohol Use: No    OB History   Grav Para Term Preterm Abortions TAB SAB Ect Mult Living                   Review of Systems  Constitutional:       10 Systems reviewed and all are negative for acute change except as noted in the HPI.     Allergies  Demerol  Home Medications   Current Outpatient Rx  Name  Route  Sig  Dispense  Refill  . hydrochlorothiazide (HYDRODIURIL) 25 MG tablet   Oral   Take 25 mg by mouth every morning.          Marland Kitchen HYDROcodone-acetaminophen (NORCO/VICODIN) 5-325 MG per tablet   Oral   Take 1 tablet by mouth every 6 (six) hours as needed for pain.   15 tablet   0   . ibuprofen (ADVIL,MOTRIN) 200 MG tablet   Oral   Take 600 mg by mouth every 6 (six) hours as needed. For headache         . levothyroxine (SYNTHROID, LEVOTHROID) 150 MCG tablet   Oral   Take 150 mcg by mouth every morning.         . Multiple Vitamin (MULITIVITAMIN WITH MINERALS) TABS   Oral   Take 1 tablet by mouth every morning.          Marland Kitchen omeprazole (PRILOSEC) 20 MG capsule   Oral   Take 20 mg by mouth 2 (two) times daily.          . sertraline (  ZOLOFT) 100 MG tablet   Oral   Take 100 mg by mouth every morning.           BP 153/69  Pulse 104  Temp(Src) 98.6 F (37 C) (Oral)  Ht 5\' 10"  (1.778 m)  Wt 275 lb (124.739 kg)  BMI 39.46 kg/m2  SpO2 98%  Physical Exam  Nursing note and vitals reviewed. Constitutional: She appears well-developed and well-nourished. No distress.  Awake, alert, nontoxic appearance  HENT:  Head: Atraumatic.  Eyes: Conjunctivae are normal. Right eye exhibits no discharge. Left eye exhibits no discharge.  Neck: Neck supple.  Cardiovascular: Normal rate and regular rhythm.   Pulmonary/Chest: Effort normal. No respiratory distress. She exhibits no tenderness.  Abdominal: Soft. Bowel sounds are normal. There is no tenderness. There is no rebound.  No CVA tenderness  No significant abdominal tenderness  Musculoskeletal: She exhibits no tenderness.  ROM appears intact, no obvious focal weakness  Neurological: She is alert.  Mental  status and motor strength appears intact  Skin: No rash noted.  Psychiatric: She has a normal mood and affect.    ED Course  Procedures (including critical care time)  Results for orders placed during the hospital encounter of 07/06/12  URINALYSIS, ROUTINE W REFLEX MICROSCOPIC      Result Value Range   Color, Urine AMBER (*) YELLOW   APPearance CLOUDY (*) CLEAR   Specific Gravity, Urine 1.025  1.005 - 1.030   pH 6.5  5.0 - 8.0   Glucose, UA NEGATIVE  NEGATIVE mg/dL   Hgb urine dipstick LARGE (*) NEGATIVE   Bilirubin Urine NEGATIVE  NEGATIVE   Ketones, ur TRACE (*) NEGATIVE mg/dL   Protein, ur NEGATIVE  NEGATIVE mg/dL   Urobilinogen, UA 0.2  0.0 - 1.0 mg/dL   Nitrite NEGATIVE  NEGATIVE   Leukocytes, UA MODERATE (*) NEGATIVE  CBC WITH DIFFERENTIAL      Result Value Range   WBC 10.3  4.0 - 10.5 K/uL   RBC 4.95  3.87 - 5.11 MIL/uL   Hemoglobin 13.5  12.0 - 15.0 g/dL   HCT 42.5  95.6 - 38.7 %   MCV 80.8  78.0 - 100.0 fL   MCH 27.3  26.0 - 34.0 pg   MCHC 33.8  30.0 - 36.0 g/dL   RDW 56.4  33.2 - 95.1 %   Platelets 386  150 - 400 K/uL   Neutrophils Relative 64  43 - 77 %   Lymphocytes Relative 26  12 - 46 %   Monocytes Relative 7  3 - 12 %   Eosinophils Relative 2  0 - 5 %   Basophils Relative 1  0 - 1 %   Neutro Abs 6.6  1.7 - 7.7 K/uL   Lymphs Abs 2.7  0.7 - 4.0 K/uL   Monocytes Absolute 0.7  0.1 - 1.0 K/uL   Eosinophils Absolute 0.2  0.0 - 0.7 K/uL   Basophils Absolute 0.1  0.0 - 0.1 K/uL   Smear Review LARGE PLATELETS PRESENT    URINE MICROSCOPIC-ADD ON      Result Value Range   Squamous Epithelial / LPF FEW (*) RARE   WBC, UA 0-2  <3 WBC/hpf   RBC / HPF TOO NUMEROUS TO COUNT  <3 RBC/hpf   Bacteria, UA RARE  RARE  POCT I-STAT, CHEM 8      Result Value Range   Sodium 137  135 - 145 mEq/L   Potassium 4.3  3.5 - 5.1 mEq/L  Chloride 104  96 - 112 mEq/L   BUN 14  6 - 23 mg/dL   Creatinine, Ser 0.86  0.50 - 1.10 mg/dL   Glucose, Bld 86  70 - 99 mg/dL   Calcium, Ion  5.78 (*) 1.12 - 1.23 mmol/L   TCO2 27  0 - 100 mmol/L   Hemoglobin 14.3  12.0 - 15.0 g/dL   HCT 46.9  62.9 - 52.8 %   Ct Abdomen Pelvis Wo Contrast  06/30/2012  *RADIOLOGY REPORT*  Clinical Data: Right flank pain, hematuria  CT ABDOMEN AND PELVIS WITHOUT CONTRAST  Technique:  Multidetector CT imaging of the abdomen and pelvis was performed following the standard protocol without intravenous contrast.  Comparison: CT abdomen pelvis of 06/13/2012  Findings: The lung bases are clear.  As noted previous of the liver is somewhat low attenuation consistent with fatty infiltration with areas of sparing.  The gallbladder is contracted and no calcified gallstones are seen.  The pancreas is normal in size and the pancreatic duct is not dilated.  The adrenal glands and spleen are unremarkable.  The stomach is decompressed. There may be a tiny nonobstructing right renal calculus in the lower pole of no more than 2 mm in diameter.  No skeletal abnormality is seen.  The right kidney is noted the larger than the left, but this appears unchanged compared to the prior CT.  No hydronephrosis is seen and the ureters appear normal in caliber.  The abdominal aorta appears normal.  No adenopathy is seen.  The distal ureters are normal in caliber to the bladder.  The urinary bladder is decompressed.  The uterus has previously been resected.  Small left ovarian cysts are noted.  No free fluid is seen within the pelvis.  No abnormality of the colon is seen.  The terminal ileum and the appendix are unremarkable.  IMPRESSION:  1.  No hydronephrosis.  Only a tiny 2 mm right lower pole renal calculus is noted.  The ureters are normal in caliber. 2.  Probable fatty infiltration of the liver.   Original Report Authenticated By: Dwyane Dee, M.D.    Ct Abdomen Pelvis W Contrast  06/13/2012  *RADIOLOGY REPORT*  Clinical Data: Acute onset right flank and groin pain yesterday. History of nephrolithiasis.  Previous partial right nephrectomy for  renal cell carcinoma.  CT ABDOMEN AND PELVIS WITH CONTRAST  Technique:  Multidetector CT imaging of the abdomen and pelvis was performed following the standard protocol during bolus administration of intravenous contrast.  Contrast: OMNIPAQUE IOHEXOL 300 MG/ML  SOLN  Comparison: 06/01/2012.  Findings: Stable mild diffuse low density of the liver relative to the spleen.  The spleen, pancreas, gallbladder, adrenal glands, kidneys, urinary bladder and ureters continue to have normal appearance.  Surgically absent uterus.  Interval 3.4 cm left ovarian cyst.  Additional small bilateral ovarian cysts.  Bilateral pelvic phleboliths.  No gastrointestinal abnormalities or enlarged lymph nodes.  Normal appearing appendix.  A 6 mm nodule at the left lateral lung base has not changed significantly since 07/02/2009, compatible with a benign process.  Mild lumbar and lower thoracic spine degenerative changes.  IMPRESSION:  1.  No acute abnormality. 2.  Stable mild hepatic steatosis. 3.  Interval 3.4 cm left ovarian cyst.  This does not require follow-up.   Original Report Authenticated By: Beckie Salts, M.D.    US Renal  07/06/2012  *RADIOLOGY REPORT*  Clinical Data: Nephrolithiasis.  RENAL/URINARY TRACT ULTRASOUND COMPLETE  Comparison:  CT urogram 06/30/2012  Findings:  Right Kidney:  12.3 cm in craniocaudal length.  Normal echotexture. No hydronephrosis.  Small echogenic focus projects over the lower pole measuring 4 mm compatible with nonobstructing stone.  Left Kidney:  12.8 cm. Normal size and echotexture.  No focal abnormality.  No hydronephrosis.  No renal stones.  Bladder:  Normal appearance.  IMPRESSION: Small right lower pole nonobstructing renal stones.  No acute findings.   Original Report Authenticated By: Charlett Nose, M.D.      2:08 PM Patient reports dysuria, with fever, and chills, and was   diagnosed with having a kidney stone in her right ureter a week ago. Wound recheck her urine. She appears  nontoxic, vital signs stable, afebrile currently.  3:12 PM  UA without significant signs of urinary tract infection, however leukocyte is moderate and large amount of hemoglobin a urine dipstick. Patient has normal renal function. Since patient is complaining of dysuria we'll give Rocephin here and will continue with pain management.  3:35 PM Discuss care with attending.  WIll obtain renal US to r/o hydronephrosis.  Will continue sxs treatment.    6:13 PM Patient continues to endorse significant low back pain, intermittent improved with pain medication he in the ER. Renal ultrasound shows a small right lower pole nonobstructing renal stones with no evidence of hydronephrosis. Her pain is likely due to the kidney stone coming down the ureter. Plan to continue with pain management 2 patient feels better prior to discharge.  7:25 PM Pt has intermittent relief with pain meds but now pain has return, continues to feel nauseated. Plan to give Zofran and will give percocet PO.  If pt able to tolerates PO meds, will d/c.  If unable, then will consider admission for further management.    8:18 PM Pt felt better, able to tolerates PO.  Stable for discharge.    BP 140/78  Pulse 89  Temp(Src) 98.6 F (37 C) (Oral)  Resp 20  Ht 5\' 10"  (1.778 m)  Wt 275 lb (124.739 kg)  BMI 39.46 kg/m2  SpO2 100%  I have reviewed nursing notes and vital signs. I personally reviewed the imaging tests through PACS system  I reviewed available ER/hospitalization records thought the EMR  1. Kidney stone, right 2. UTI MDM          Fayrene Helper, PA-C 07/06/12 2018

## 2012-07-06 NOTE — ED Notes (Signed)
US at bedside

## 2012-07-06 NOTE — ED Notes (Signed)
Patient was diagnosed last weekend at [facility] with a kidney stone.  Patient was referred to urologist and had an appt on Friday which was cancelled because of the weather.  Patient reports fever up to 102 last night, hematuria, dysuria, and N/V.

## 2012-07-07 NOTE — ED Provider Notes (Signed)
Medical screening examination/treatment/procedure(s) were performed by non-physician practitioner and as supervising physician I was immediately available for consultation/collaboration.   Richardean Canal, MD 07/07/12 [phone]

## 2012-10-20 ENCOUNTER — Encounter (HOSPITAL_BASED_OUTPATIENT_CLINIC_OR_DEPARTMENT_OTHER): Payer: Self-pay

## 2012-10-20 ENCOUNTER — Emergency Department (HOSPITAL_BASED_OUTPATIENT_CLINIC_OR_DEPARTMENT_OTHER): Payer: Self-pay

## 2012-10-20 ENCOUNTER — Emergency Department (HOSPITAL_BASED_OUTPATIENT_CLINIC_OR_DEPARTMENT_OTHER)
Admission: EM | Admit: 2012-10-20 | Discharge: 2012-10-21 | Disposition: A | Discharge status: Home / Self Care | Payer: Self-pay | Attending: Emergency Medicine | Admitting: Emergency Medicine

## 2012-10-20 DIAGNOSIS — K219 Gastro-esophageal reflux disease without esophagitis: Secondary | ICD-10-CM | POA: Insufficient documentation

## 2012-10-20 DIAGNOSIS — Z862 Personal history of diseases of the blood and blood-forming organs and certain disorders involving the immune mechanism: Secondary | ICD-10-CM | POA: Insufficient documentation

## 2012-10-20 DIAGNOSIS — N390 Urinary tract infection, site not specified: Secondary | ICD-10-CM

## 2012-10-20 DIAGNOSIS — Z85850 Personal history of malignant neoplasm of thyroid: Secondary | ICD-10-CM | POA: Insufficient documentation

## 2012-10-20 DIAGNOSIS — Z85528 Personal history of other malignant neoplasm of kidney: Secondary | ICD-10-CM | POA: Insufficient documentation

## 2012-10-20 DIAGNOSIS — E079 Disorder of thyroid, unspecified: Secondary | ICD-10-CM | POA: Insufficient documentation

## 2012-10-20 DIAGNOSIS — I10 Essential (primary) hypertension: Secondary | ICD-10-CM | POA: Insufficient documentation

## 2012-10-20 DIAGNOSIS — Z8639 Personal history of other endocrine, nutritional and metabolic disease: Secondary | ICD-10-CM | POA: Insufficient documentation

## 2012-10-20 DIAGNOSIS — Z90710 Acquired absence of both cervix and uterus: Secondary | ICD-10-CM | POA: Insufficient documentation

## 2012-10-20 DIAGNOSIS — R6883 Chills (without fever): Secondary | ICD-10-CM | POA: Insufficient documentation

## 2012-10-20 DIAGNOSIS — R109 Unspecified abdominal pain: Secondary | ICD-10-CM

## 2012-10-20 DIAGNOSIS — Z79899 Other long term (current) drug therapy: Secondary | ICD-10-CM | POA: Insufficient documentation

## 2012-10-20 DIAGNOSIS — R110 Nausea: Secondary | ICD-10-CM | POA: Insufficient documentation

## 2012-10-20 DIAGNOSIS — Z87442 Personal history of urinary calculi: Secondary | ICD-10-CM | POA: Insufficient documentation

## 2012-10-20 DIAGNOSIS — Z87448 Personal history of other diseases of urinary system: Secondary | ICD-10-CM | POA: Insufficient documentation

## 2012-10-20 DIAGNOSIS — R319 Hematuria, unspecified: Secondary | ICD-10-CM

## 2012-10-20 LAB — URINALYSIS, ROUTINE W REFLEX MICROSCOPIC
Bilirubin Urine: NEGATIVE
Glucose, UA: NEGATIVE mg/dL
Ketones, ur: NEGATIVE mg/dL
Specific Gravity, Urine: 1.021 (ref 1.005–1.030)
pH: 6 (ref 5.0–8.0)

## 2012-10-20 LAB — BASIC METABOLIC PANEL
CO2: 25 mEq/L (ref 19–32)
Chloride: 97 mEq/L (ref 96–112)
Creatinine, Ser: 0.8 mg/dL (ref 0.50–1.10)
GFR calc Af Amer: >90 mL/min (ref >90)
Potassium: 3.8 mEq/L (ref 3.5–5.1)
Sodium: 136 mEq/L (ref 135–145)

## 2012-10-20 LAB — CBC WITH DIFFERENTIAL/PLATELET
Basophils Relative: 0 % (ref 0–1)
Eosinophils Absolute: 0.3 10*3/uL (ref 0.0–0.7)
HCT: 40.1 % (ref 36.0–46.0)
Lymphocytes Relative: 26 % (ref 12–46)
Lymphs Abs: 3.8 10*3/uL (ref 0.7–4.0)
MCV: 81.5 fL (ref 78.0–100.0)
Monocytes Relative: 6 % (ref 3–12)
Neutro Abs: 9.5 10*3/uL — ABNORMAL HIGH (ref 1.7–7.7)
Platelets: 396 10*3/uL (ref 150–400)
RBC: 4.92 MIL/uL (ref 3.87–5.11)
RDW: 13.7 % (ref 11.5–15.5)
WBC: 14.5 10*3/uL — ABNORMAL HIGH (ref 4.0–10.5)

## 2012-10-20 LAB — URINE MICROSCOPIC-ADD ON

## 2012-10-20 MED ORDER — CEPHALEXIN 500 MG PO CAPS: 500.0000 mg | ORAL_CAPSULE | Freq: Four times a day (QID) | ORAL | Status: DC

## 2012-10-20 MED ORDER — OXYCODONE-ACETAMINOPHEN 5-325 MG PO TABS: 1.0000 | ORAL_TABLET | Freq: Four times a day (QID) | ORAL | Status: DC | PRN

## 2012-10-20 MED ORDER — SODIUM CHLORIDE 0.9 % IV BOLUS (SEPSIS)
250.0000 mL | Freq: Once | INTRAVENOUS | Status: AC
  Administered 2012-10-20: 20:00:00 via INTRAVENOUS

## 2012-10-20 MED ORDER — ONDANSETRON 4 MG PO TBDP: 4.0000 mg | ORAL_TABLET | Freq: Three times a day (TID) | ORAL | Status: DC | PRN

## 2012-10-20 MED ORDER — ONDANSETRON HCL 4 MG/2ML IJ SOLN
4.0000 mg | Freq: Once | INTRAMUSCULAR | Status: AC
  Administered 2012-10-20: 4 mg via INTRAVENOUS
  Filled 2012-10-20: qty 2

## 2012-10-20 MED ORDER — HYDROMORPHONE HCL PF 1 MG/ML IJ SOLN
1.0000 mg | Freq: Once | INTRAMUSCULAR | Status: AC
  Administered 2012-10-20: 1 mg via INTRAVENOUS
  Filled 2012-10-20: qty 1

## 2012-10-20 MED ORDER — HYDROMORPHONE HCL PF 1 MG/ML IJ SOLN
INTRAMUSCULAR | Status: AC
  Administered 2012-10-20: 1 mg
  Filled 2012-10-20: qty 1

## 2012-10-20 MED ORDER — SODIUM CHLORIDE 0.9 % IV SOLN
INTRAVENOUS | Status: DC
  Administered 2012-10-20: 20:00:00 via INTRAVENOUS

## 2012-10-20 MED ORDER — DEXTROSE 5 % IV SOLN
1.0000 g | Freq: Once | INTRAVENOUS | Status: AC
  Administered 2012-10-20: 1 g via INTRAVENOUS
  Filled 2012-10-20: qty 10

## 2012-10-20 NOTE — ED Notes (Signed)
Patient here with right flank pain since early am. This afternoon returned with nausea and hematuria. Hx of stones in past.

## 2012-10-20 NOTE — ED Provider Notes (Signed)
History    This chart was scribed for Yolanda Jakes, MD by Quintella Reichert, ED scribe.  This patient was seen in room MH02/MH02 and the patient's care was started at 7:52 PM.   CSN: [id number]  Arrival date & time 10/20/12  1847      Chief Complaint  Patient presents with  . Flank Pain     Patient is a 42 y.o. female presenting with flank pain. The history is provided by the patient. No language interpreter was used.  Flank Pain This is a recurrent problem. The current episode started 6 to 12 hours ago. The problem occurs constantly. Pertinent negatives include no chest pain, no headaches and no shortness of breath. Nothing aggravates the symptoms. Nothing relieves the symptoms. She has tried nothing for the symptoms.    HPI Comments: Yolanda Matthews is a 42 y.o. female who presents to the Emergency Department complaining of constant right-sided flank pain that began 13 hours ago, with accompanying hematuria, nausea and chills.  Pt has h/o kidney stones and states present symptoms feel similar.  She states she last had kidney stones 6 months ago.  Pt also has h/o kidney cancer and had a right-sided open partial nephrectomy 3 years ago.  She denies emesis, fever, frontal abdominal pain, CP, SOB, visual changes, cough, congestion, rhinorrhea, swelling in legs, neck pain, back pain, rash, headache or any other associated symptoms.  She denies h/o bleeding easily.  She admits to Demerol allergy.  She denies allergy to Dilaudid.     Past Medical History  Diagnosis Date  . Cancer   . Renal disorder   . Thyroid disease   . Hypertension   . Polycystic ovarian syndrome   . GERD (gastroesophageal reflux disease)   . Kidney stones   . Thyroid cancer   . History of kidney cancer   . Kidney stones     Past Surgical History  Procedure Laterality Date  . Cesarean section    . Tonsillectomy    . Abdominal hysterectomy    . Partial nephrectomy      No family history on file.  History   Substance Use Topics  . Smoking status: Never Smoker   . Smokeless tobacco: Never Used  . Alcohol Use: No    OB History   Grav Para Term Preterm Abortions TAB SAB Ect Mult Living                  Review of Systems  Constitutional: Positive for chills. Negative for fever.  HENT: Negative for congestion, rhinorrhea and neck pain.   Eyes: Negative for visual disturbance.  Respiratory: Negative for cough and shortness of breath.   Cardiovascular: Negative for chest pain and leg swelling.  Gastrointestinal: Positive for nausea. Negative for vomiting.  Genitourinary: Positive for hematuria and flank pain.  Musculoskeletal: Negative for back pain.  Skin: Negative for rash.  Neurological: Negative for headaches.  Hematological: Does not bruise/bleed easily.  Psychiatric/Behavioral: Negative for confusion.    Allergies  Demerol  Home Medications   Current Outpatient Rx  Name  Route  Sig  Dispense  Refill  . metoprolol tartrate (LOPRESSOR) 25 MG tablet   Oral   Take 25 mg by mouth 2 (two) times daily.         . cephALEXin (KEFLEX) 500 MG capsule   Oral   Take 1 capsule (500 mg total) by mouth 4 (four) times daily.   40 capsule   0   . hydrochlorothiazide (HYDRODIURIL)  25 MG tablet   Oral   Take 25 mg by mouth every morning.          Marland Kitchen HYDROcodone-acetaminophen (NORCO/VICODIN) 5-325 MG per tablet   Oral   Take 1 tablet by mouth every 6 (six) hours as needed for pain.   15 tablet   0   . ibuprofen (ADVIL,MOTRIN) 200 MG tablet   Oral   Take 600 mg by mouth every 6 (six) hours as needed. For headache         . levothyroxine (SYNTHROID, LEVOTHROID) 150 MCG tablet   Oral   Take 150 mcg by mouth every morning.         . Multiple Vitamin (MULITIVITAMIN WITH MINERALS) TABS   Oral   Take 1 tablet by mouth every morning.          . nitrofurantoin, macrocrystal-monohydrate, (MACROBID) 100 MG capsule   Oral   Take 1 capsule (100 mg total) by mouth 2 (two)  times daily.   10 capsule   0   . omeprazole (PRILOSEC) 20 MG capsule   Oral   Take 20 mg by mouth 2 (two) times daily.          . ondansetron (ZOFRAN ODT) 4 MG disintegrating tablet   Oral   Take 1 tablet (4 mg total) by mouth every 8 (eight) hours as needed for nausea.   10 tablet   0   . ondansetron (ZOFRAN) 4 MG tablet   Oral   Take 1 tablet (4 mg total) by mouth every 6 (six) hours.   12 tablet   0   . oxyCODONE-acetaminophen (PERCOCET/ROXICET) 5-325 MG per tablet   Oral   Take 1-2 tablets by mouth every 6 (six) hours as needed for pain.   20 tablet   0   . sertraline (ZOLOFT) 100 MG tablet   Oral   Take 100 mg by mouth every morning.           BP 147/91  Pulse 98  Temp(Src) 99.1 F (37.3 C) (Oral)  Resp 16  SpO2 97%  Physical Exam  Nursing note and vitals reviewed. Constitutional: She is oriented to person, place, and time. She appears well-developed and well-nourished. No distress.  HENT:  Head: Normocephalic and atraumatic.  Eyes: Conjunctivae and EOM are normal.  Neck: Normal range of motion. Neck supple.  Cardiovascular: Normal rate, regular rhythm and normal heart sounds.   No murmur heard. Pulmonary/Chest: Effort normal and breath sounds normal. No respiratory distress. She has no wheezes. She has no rales.  Abdominal: Soft. Bowel sounds are normal. There is no tenderness.  Musculoskeletal: Normal range of motion. She exhibits no tenderness.  Neurological: She is alert and oriented to person, place, and time. No cranial nerve deficit. Coordination normal.  Skin: Skin is warm and dry. No rash noted.  Psychiatric: She has a normal mood and affect. Her behavior is normal.    ED Course  Procedures (including critical care time)  DIAGNOSTIC STUDIES: Oxygen Saturation is 97% on room air, normal by my interpretation.    COORDINATION OF CARE: 7:59 PM-Discussed treatment plan which includes UA, labs, abdominal CT, Zofran, Dilaudid and IV fluids  with pt at bedside and pt agreed to plan.     Medications  0.9 %  sodium chloride infusion ( Intravenous New Bag/Given 10/20/12 2005)  ondansetron [facility]) injection 4 mg (4 mg Intravenous Given 10/20/12 2005)  HYDROmorphone (DILAUDID) injection 1 mg (1 mg Intravenous Given 10/20/12 2005)  sodium  chloride 0.9 % bolus 250 mL (0 mLs Intravenous Stopped 10/20/12 2039)  HYDROmorphone (DILAUDID) 1 MG/ML injection (1 mg  Given 10/20/12 2039)  HYDROmorphone (DILAUDID) injection 1 mg (1 mg Intravenous Given 10/20/12 2158)  cefTRIAXone (ROCEPHIN) 1 g in dextrose 5 % 50 mL IVPB (0 g Intravenous Stopped 10/20/12 2233)     Results for orders placed during the hospital encounter of 10/20/12  URINALYSIS, ROUTINE W REFLEX MICROSCOPIC      Result Value Range   Color, Urine YELLOW  YELLOW   APPearance CLOUDY (*) CLEAR   Specific Gravity, Urine 1.021  1.005 - 1.030   pH 6.0  5.0 - 8.0   Glucose, UA NEGATIVE  NEGATIVE mg/dL   Hgb urine dipstick LARGE (*) NEGATIVE   Bilirubin Urine NEGATIVE  NEGATIVE   Ketones, ur NEGATIVE  NEGATIVE mg/dL   Protein, ur NEGATIVE  NEGATIVE mg/dL   Urobilinogen, UA 0.2  0.0 - 1.0 mg/dL   Nitrite NEGATIVE  NEGATIVE   Leukocytes, UA SMALL (*) NEGATIVE  URINE MICROSCOPIC-ADD ON      Result Value Range   Squamous Epithelial / LPF FEW (*) RARE   WBC, UA 3-6  <3 WBC/hpf   RBC / HPF TOO NUMEROUS TO COUNT  <3 RBC/hpf  BASIC METABOLIC PANEL      Result Value Range   Sodium 136  135 - 145 mEq/L   Potassium 3.8  3.5 - 5.1 mEq/L   Chloride 97  96 - 112 mEq/L   CO2 25  19 - 32 mEq/L   Glucose, Bld 98  70 - 99 mg/dL   BUN 16  6 - 23 mg/dL   Creatinine, Ser 2.95  0.50 - 1.10 mg/dL   Calcium 9.7  8.4 - 62.1 mg/dL   GFR calc non Af Amer >90  >90 mL/min   GFR calc Af Amer >90  >90 mL/min  CBC WITH DIFFERENTIAL      Result Value Range   WBC 14.5 (*) 4.0 - 10.5 K/uL   RBC 4.92  3.87 - 5.11 MIL/uL   Hemoglobin 13.6  12.0 - 15.0 g/dL   HCT 30.8  65.7 - 84.6 %   MCV 81.5  78.0 - 100.0 fL    MCH 27.6  26.0 - 34.0 pg   MCHC 33.9  30.0 - 36.0 g/dL   RDW 96.2  95.2 - 84.1 %   Platelets 396  150 - 400 K/uL   Neutrophils Relative % 66  43 - 77 %   Neutro Abs 9.5 (*) 1.7 - 7.7 K/uL   Lymphocytes Relative 26  12 - 46 %   Lymphs Abs 3.8  0.7 - 4.0 K/uL   Monocytes Relative 6  3 - 12 %   Monocytes Absolute 0.8  0.1 - 1.0 K/uL   Eosinophils Relative 2  0 - 5 %   Eosinophils Absolute 0.3  0.0 - 0.7 K/uL   Basophils Relative 0  0 - 1 %   Basophils Absolute 0.1  0.0 - 0.1 K/uL     Ct Abdomen Pelvis Wo Contrast  10/20/2012   *RADIOLOGY REPORT*  Clinical Data: Right flank pain.  History of kidney stones.  CT ABDOMEN AND PELVIS WITHOUT CONTRAST  Technique:  Multidetector CT imaging of the abdomen and pelvis was performed following the standard protocol without intravenous contrast.  Comparison: Prior examination 06/30/2012.  Findings: Subpleural nodule at the left lung base on image 13 is stable from 11/26/2009, consistent with a benign finding.  The lung  bases are otherwise clear.  There is no pleural effusion.  There are possible tiny renal calculi bilaterally, best seen in the right kidney on axial image 34 and in the left kidney on coronal image 73.  There is no hydronephrosis or perinephric soft tissue stranding.  There is no evidence of ureteral or bladder calculus. Right renal cortical scarring is stable.  There is diffuse hepatic low density consistent with steatosis. This is similar to the prior study and with some sparing around the gallbladder.  No focal abnormalities are identified.  The spleen, gallbladder, pancreas and adrenal glands appear normal.  Moderate stool is present throughout the colon.  The appendix appears normal.  No inflammatory changes or enlarged lymph nodes are identified.  There is no suspicious pelvic mass status post hysterectomy. Small cysts are again noted within the left ovary, measuring up to 3.0 cm on image 81.  IMPRESSION:  1.  Possible tiny bilateral renal  calculi.  No evidence of ureteral calculus or hydronephrosis. 2.  Hepatic steatosis. 3.  No acute abdominal pelvic findings.   Original Report Authenticated By: Carey Bullocks, M.D.      1. Right flank pain   2. Hematuria   3. Urinary tract infection       MDM   Workup for kidney stone was negative. Hematuria was present so we'll treat as if it could be urinary tract infection or perhaps pyelonephritis. Take pain medicine as directed take antibiotic as directed first dose of Rocephin given in the emergency department. Kidney function was normal cytosis was present but could be due to pain or the infection no distinct fever. MAXIMUM TEMPERATURE here 99.1. CT scan as mentioned was negative for any ureteral stones. Patient will return if not improved in 2 days on the antibiotics or for any newer worse symptoms.      I personally performed the services described in this documentation, which was scribed in my presence. The recorded information has been reviewed and is accurate.     Yolanda Jakes, MD 10/20/12 [phone]

## 2012-10-20 NOTE — ED Notes (Signed)
MD at bedside for assessment

## 2012-12-22 ENCOUNTER — Encounter (HOSPITAL_COMMUNITY): Payer: Self-pay | Admitting: *Deleted

## 2012-12-22 ENCOUNTER — Emergency Department (HOSPITAL_COMMUNITY)
Admission: EM | Admit: 2012-12-22 | Discharge: 2012-12-22 | Disposition: A | Discharge status: Home / Self Care | Payer: Self-pay | Attending: Emergency Medicine | Admitting: Emergency Medicine

## 2012-12-22 ENCOUNTER — Emergency Department (HOSPITAL_COMMUNITY): Payer: Self-pay

## 2012-12-22 DIAGNOSIS — R109 Unspecified abdominal pain: Secondary | ICD-10-CM | POA: Insufficient documentation

## 2012-12-22 DIAGNOSIS — R319 Hematuria, unspecified: Secondary | ICD-10-CM | POA: Insufficient documentation

## 2012-12-22 DIAGNOSIS — I10 Essential (primary) hypertension: Secondary | ICD-10-CM | POA: Insufficient documentation

## 2012-12-22 DIAGNOSIS — R112 Nausea with vomiting, unspecified: Secondary | ICD-10-CM | POA: Insufficient documentation

## 2012-12-22 DIAGNOSIS — Z87442 Personal history of urinary calculi: Secondary | ICD-10-CM | POA: Insufficient documentation

## 2012-12-22 DIAGNOSIS — Z85528 Personal history of other malignant neoplasm of kidney: Secondary | ICD-10-CM | POA: Insufficient documentation

## 2012-12-22 DIAGNOSIS — Z79899 Other long term (current) drug therapy: Secondary | ICD-10-CM | POA: Insufficient documentation

## 2012-12-22 DIAGNOSIS — Z905 Acquired absence of kidney: Secondary | ICD-10-CM | POA: Insufficient documentation

## 2012-12-22 DIAGNOSIS — C73 Malignant neoplasm of thyroid gland: Secondary | ICD-10-CM | POA: Insufficient documentation

## 2012-12-22 DIAGNOSIS — K219 Gastro-esophageal reflux disease without esophagitis: Secondary | ICD-10-CM | POA: Insufficient documentation

## 2012-12-22 LAB — BASIC METABOLIC PANEL
BUN: 12 mg/dL (ref 6–23)
Calcium: 9.8 mg/dL (ref 8.4–10.5)
Creatinine, Ser: 0.63 mg/dL (ref 0.50–1.10)
GFR calc Af Amer: >90 mL/min (ref >90)
GFR calc non Af Amer: >90 mL/min (ref >90)
Potassium: 4 mEq/L (ref 3.5–5.1)

## 2012-12-22 LAB — URINALYSIS, ROUTINE W REFLEX MICROSCOPIC
Bilirubin Urine: NEGATIVE
Ketones, ur: NEGATIVE mg/dL
Nitrite: NEGATIVE
Protein, ur: NEGATIVE mg/dL
Specific Gravity, Urine: 1.027 (ref 1.005–1.030)
Urobilinogen, UA: 0.2 mg/dL (ref 0.0–1.0)

## 2012-12-22 MED ORDER — ONDANSETRON HCL 4 MG/2ML IJ SOLN
4.0000 mg | Freq: Once | INTRAMUSCULAR | Status: AC
  Administered 2012-12-22: 4 mg via INTRAVENOUS
  Filled 2012-12-22: qty 2

## 2012-12-22 MED ORDER — METOCLOPRAMIDE HCL 5 MG/ML IJ SOLN
10.0000 mg | Freq: Once | INTRAMUSCULAR | Status: AC
  Administered 2012-12-22: 10 mg via INTRAVENOUS
  Filled 2012-12-22: qty 2

## 2012-12-22 MED ORDER — OXYCODONE-ACETAMINOPHEN 5-325 MG PO TABS: 2.0000 | ORAL_TABLET | ORAL | Status: DC | PRN

## 2012-12-22 MED ORDER — HYDROMORPHONE HCL PF 1 MG/ML IJ SOLN
1.0000 mg | Freq: Once | INTRAMUSCULAR | Status: AC
  Administered 2012-12-22: 1 mg via INTRAVENOUS
  Filled 2012-12-22: qty 1

## 2012-12-22 MED ORDER — CIPROFLOXACIN HCL 500 MG PO TABS: 500.0000 mg | ORAL_TABLET | Freq: Two times a day (BID) | ORAL | Status: DC

## 2012-12-22 MED ORDER — PROMETHAZINE HCL 25 MG PO TABS: 25.0000 mg | ORAL_TABLET | Freq: Four times a day (QID) | ORAL | Status: DC | PRN

## 2012-12-22 NOTE — ED Provider Notes (Signed)
CSN: [id number]     Arrival date & time 12/22/12  1209 History     First MD Initiated Contact with Patient 12/22/12 1235     Chief Complaint  Patient presents with  . Flank Pain  . Hematuria   (Consider location/radiation/quality/duration/timing/severity/associated sxs/prior Treatment) HPI Comments: Patient presents with right flank pain. She describes a sharp pain it starts in her back and radiates to her right lower quadrant. She says at times is also in the left side. She's been evaluated here several times for possible kidney stones with flank pain and hematuria. She's had 4 CT scans this year and that have showed renal stones but new ureteral stones. She states she has not followed up with a urologist other than this past November she did have a cystoscopy related to her ongoing hematuria. She does have a prior history of renal cell carcinoma with a partial kidney removal at [facility]. She denies he fevers or chills. She does have some hematuria but no burning in urination or increased frequency. She denies any vaginal complaints. She has had some nausea and vomiting but no fevers.  Patient is a 42 y.o. female presenting with flank pain and hematuria.  Flank Pain Pertinent negatives include no chest pain, no abdominal pain, no headaches and no shortness of breath.  Hematuria Pertinent negatives include no chest pain, no abdominal pain, no headaches and no shortness of breath.    Past Medical History  Diagnosis Date  . Renal disorder   . Thyroid disease   . Hypertension   . Polycystic ovarian syndrome   . GERD (gastroesophageal reflux disease)   . Kidney stones   . History of kidney cancer   . Kidney stones   . Cancer     renal  . Thyroid cancer    Past Surgical History  Procedure Laterality Date  . Cesarean section    . Tonsillectomy    . Abdominal hysterectomy    . Partial nephrectomy     No family history on file. History  Substance Use Topics  .  Smoking status: Never Smoker   . Smokeless tobacco: Never Used  . Alcohol Use: No   OB History   Grav Para Term Preterm Abortions TAB SAB Ect Mult Living                 Review of Systems  Constitutional: Negative for fever, chills, diaphoresis and fatigue.  HENT: Negative for congestion, rhinorrhea and sneezing.   Eyes: Negative.   Respiratory: Negative for cough, chest tightness and shortness of breath.   Cardiovascular: Negative for chest pain and leg swelling.  Gastrointestinal: Negative for nausea, vomiting, abdominal pain, diarrhea and blood in stool.  Genitourinary: Positive for hematuria and flank pain. Negative for frequency, vaginal bleeding, vaginal discharge, difficulty urinating and vaginal pain.  Musculoskeletal: Negative for back pain and arthralgias.  Skin: Negative for rash.  Neurological: Negative for dizziness, speech difficulty, weakness, numbness and headaches.    Allergies  Demerol  Home Medications   Current Outpatient Rx  Name  Route  Sig  Dispense  Refill  . cholecalciferol (VITAMIN D) 1000 UNITS tablet   Oral   Take 1,000 Units by mouth daily.         . fish oil-omega-3 fatty acids 1000 MG capsule   Oral   Take 1 g by mouth 2 (two) times daily.         . hydrochlorothiazide (HYDRODIURIL) 25 MG tablet   Oral  Take 25 mg by mouth every morning.          Marland Kitchen ibuprofen (ADVIL,MOTRIN) 200 MG tablet   Oral   Take 600 mg by mouth every 8 (eight) hours as needed for pain.          Marland Kitchen levothyroxine (SYNTHROID, LEVOTHROID) 150 MCG tablet   Oral   Take 150 mcg by mouth every morning.         . metoprolol tartrate (LOPRESSOR) 25 MG tablet   Oral   Take 25 mg by mouth 2 (two) times daily.         . Multiple Vitamin (MULITIVITAMIN WITH MINERALS) TABS   Oral   Take 1 tablet by mouth every morning.          Marland Kitchen omeprazole (PRILOSEC) 20 MG capsule   Oral   Take 20 mg by mouth 2 (two) times daily.          . sertraline (ZOLOFT) 100 MG  tablet   Oral   Take 100 mg by mouth every morning.          BP 158/99  Pulse 103  Temp(Src) 98 F (36.7 C) (Oral)  Resp 22  SpO2 95% Physical Exam  Constitutional: She is oriented to person, place, and time. She appears well-developed and well-nourished.  HENT:  Head: Normocephalic and atraumatic.  Eyes: Pupils are equal, round, and reactive to light.  Neck: Normal range of motion. Neck supple.  Cardiovascular: Normal rate, regular rhythm and normal heart sounds.   Pulmonary/Chest: Effort normal and breath sounds normal. No respiratory distress. She has no wheezes. She has no rales. She exhibits no tenderness.  Abdominal: Soft. Bowel sounds are normal. There is tenderness (moderate tenderness to the right midabdomen and mid back. There's no pain in the right upper quadrant. There is new pain in McBurney's point). There is no rebound and no guarding.  Musculoskeletal: Normal range of motion. She exhibits no edema.  Lymphadenopathy:    She has no cervical adenopathy.  Neurological: She is alert and oriented to person, place, and time.  Skin: Skin is warm and dry. No rash noted.  Psychiatric: She has a normal mood and affect.    ED Course   Procedures (including critical care time)  Results for orders placed during the hospital encounter of 12/22/12  BASIC METABOLIC PANEL      Result Value Range   Sodium 137  135 - 145 mEq/L   Potassium 4.0  3.5 - 5.1 mEq/L   Chloride 99  96 - 112 mEq/L   CO2 25  19 - 32 mEq/L   Glucose, Bld 105 (*) 70 - 99 mg/dL   BUN 12  6 - 23 mg/dL   Creatinine, Ser 4.09  0.50 - 1.10 mg/dL   Calcium 9.8  8.4 - 81.1 mg/dL   GFR calc non Af Amer >90  >90 mL/min   GFR calc Af Amer >90  >90 mL/min  URINALYSIS, ROUTINE W REFLEX MICROSCOPIC      Result Value Range   Color, Urine YELLOW  YELLOW   APPearance CLOUDY (*) CLEAR   Specific Gravity, Urine 1.027  1.005 - 1.030   pH 6.5  5.0 - 8.0   Glucose, UA NEGATIVE  NEGATIVE mg/dL   Hgb urine dipstick  LARGE (*) NEGATIVE   Bilirubin Urine NEGATIVE  NEGATIVE   Ketones, ur NEGATIVE  NEGATIVE mg/dL   Protein, ur NEGATIVE  NEGATIVE mg/dL   Urobilinogen, UA 0.2  0.0 - 1.0  mg/dL   Nitrite NEGATIVE  NEGATIVE   Leukocytes, UA MODERATE (*) NEGATIVE  URINE MICROSCOPIC-ADD ON      Result Value Range   Squamous Epithelial / LPF RARE  RARE   WBC, UA 11-20  <3 WBC/hpf   RBC / HPF TOO NUMEROUS TO COUNT  <3 RBC/hpf   Bacteria, UA MANY (*) RARE   Urine-Other MUCOUS PRESENT     Dg Abd 1 View  12/22/2012   *RADIOLOGY REPORT*  Clinical Data: Bilateral flank pain.  Right growing pain.  ABDOMEN - 1 VIEW  Comparison: CT 10/20/2012  Findings: There is a nonobstructive bowel gas pattern.  No supine evidence of free air.  No organomegaly or suspicious calcification.  No acute bony abnormality.  Visualized lung bases clear.  IMPRESSION: No acute findings.   Original Report Authenticated By: Charlett Nose, M.D.     Dg Abd 1 View  12/22/2012   *RADIOLOGY REPORT*  Clinical Data: Bilateral flank pain.  Right growing pain.  ABDOMEN - 1 VIEW  Comparison: CT 10/20/2012  Findings: There is a nonobstructive bowel gas pattern.  No supine evidence of free air.  No organomegaly or suspicious calcification.  No acute bony abnormality.  Visualized lung bases clear.  IMPRESSION: No acute findings.   Original Report Authenticated By: Charlett Nose, M.D.   1. Flank pain   2. Hematuria     MDM  Patient presents with flank pain and hematuria. She's had further CT scans this year that have not shown any ureteral stones. She does have some signs of infection and she states that she was treated for same in June she did improve. I will go ahead and send her urine for culture and start her on Cipro. I also gave her prescription for some pain medication. Encouraged her to followup with her physician for further evaluation. She will also need to followup with her urologist if her hematuria continues although she has had a CT scan which did not  show any evidence of renal mass. She's had apparently a cystoscopy this past November.  Rolan Bucco, MD 12/22/12 [phone]

## 2012-12-22 NOTE — ED Notes (Signed)
Pt states Friday started having L flank pain and noticed some blood in urine, states went away and then last night started back again.

## 2012-12-22 NOTE — ED Notes (Signed)
Patient feeling nauseous and dry heaving

## 2012-12-23 MED ORDER — ONDANSETRON HCL 4 MG/2ML IJ SOLN
INTRAMUSCULAR | Status: AC
  Filled 2012-12-23: qty 2

## 2012-12-24 LAB — URINE CULTURE: Colony Count: 60000

## 2012-12-25 NOTE — ED Notes (Signed)
Adequately treated-per Emily West 

## 2013-01-11 LAB — URINALYSIS, COMPLETE
Bacteria: NONE SEEN
Bilirubin,UR: NEGATIVE
Ketone: NEGATIVE
Nitrite: NEGATIVE
Ph: 6 (ref 4.5–8.0)
Protein: 25
RBC,UR: 1198 /HPF (ref 0–5)
Squamous Epithelial: 1

## 2013-01-11 LAB — BASIC METABOLIC PANEL
BUN: 20 mg/dL — ABNORMAL HIGH (ref 7–18)
Calcium, Total: 9.5 mg/dL (ref 8.5–10.1)
Chloride: 101 mmol/L (ref 98–107)
Co2: 28 mmol/L (ref 21–32)
Creatinine: 0.73 mg/dL (ref 0.60–1.30)
EGFR (African American): >60
Glucose: 96 mg/dL (ref 65–99)
Osmolality: 274 (ref 275–301)
Potassium: 3.6 mmol/L (ref 3.5–5.1)
Sodium: 136 mmol/L (ref 136–145)

## 2013-01-11 LAB — CBC
MCHC: 34.1 g/dL (ref 32.0–36.0)
MCV: 81 fL (ref 80–100)
Platelet: 330 10*3/uL (ref 150–440)
RDW: 14.4 % (ref 11.5–14.5)
WBC: 14.1 10*3/uL — ABNORMAL HIGH (ref 3.6–11.0)

## 2013-01-12 ENCOUNTER — Inpatient Hospital Stay: Payer: Self-pay | Admitting: Internal Medicine

## 2013-01-13 LAB — BASIC METABOLIC PANEL
Anion Gap: 4 — ABNORMAL LOW (ref 7–16)
BUN: 9 mg/dL (ref 7–18)
Calcium, Total: 8.4 mg/dL — ABNORMAL LOW (ref 8.5–10.1)
Co2: 30 mmol/L (ref 21–32)
Creatinine: 0.64 mg/dL (ref 0.60–1.30)
EGFR (Non-African Amer.): >60
Glucose: 94 mg/dL (ref 65–99)
Osmolality: 274 (ref 275–301)
Sodium: 138 mmol/L (ref 136–145)

## 2013-01-13 LAB — CBC WITH DIFFERENTIAL/PLATELET
Basophil #: 0.1 10*3/uL (ref 0.0–0.1)
HCT: 34.1 % — ABNORMAL LOW (ref 35.0–47.0)
HGB: 11.5 g/dL — ABNORMAL LOW (ref 12.0–16.0)
Lymphocyte #: 2.6 10*3/uL (ref 1.0–3.6)
Lymphocyte %: 26.9 %
MCV: 82 fL (ref 80–100)
Monocyte #: 0.7 x10 3/mm (ref 0.2–0.9)
Monocyte %: 6.9 %
Neutrophil %: 61.7 %
WBC: 9.8 10*3/uL (ref 3.6–11.0)

## 2013-01-13 LAB — URINE CULTURE

## 2013-03-15 ENCOUNTER — Encounter: Payer: Self-pay | Admitting: Emergency Medicine

## 2013-03-15 ENCOUNTER — Emergency Department
Admission: EM | Admit: 2013-03-15 | Discharge: 2013-03-15 | Disposition: A | Discharge status: Home / Self Care | Payer: BC Managed Care – PPO | Source: Home / Self Care | Attending: Family Medicine | Admitting: Family Medicine

## 2013-03-15 DIAGNOSIS — R319 Hematuria, unspecified: Secondary | ICD-10-CM

## 2013-03-15 DIAGNOSIS — R109 Unspecified abdominal pain: Secondary | ICD-10-CM

## 2013-03-15 LAB — POCT URINALYSIS DIP (MANUAL ENTRY)
Bilirubin, UA: NEGATIVE
Glucose, UA: NEGATIVE
Ketones, POC UA: NEGATIVE
Nitrite, UA: NEGATIVE
Protein Ur, POC: NEGATIVE
Spec Grav, UA: 1.025 (ref 1.005–1.03)
Urobilinogen, UA: 0.2 (ref 0–1)
pH, UA: 6.5 (ref 5–8)

## 2013-03-15 MED ORDER — CIPROFLOXACIN HCL 500 MG PO TABS: 500.0000 mg | ORAL_TABLET | Freq: Two times a day (BID) | ORAL | Status: DC

## 2013-03-15 MED ORDER — HYDROCODONE-ACETAMINOPHEN 5-325 MG PO TABS: ORAL_TABLET | ORAL | Status: DC

## 2013-03-15 NOTE — ED Notes (Signed)
Pt complains of hematuria and back pain. Also nausea and vomiting starting the am of 10.25.14. Pt denies fever, chills and sweats.

## 2013-03-15 NOTE — ED Provider Notes (Signed)
CSN: [id number]     Arrival date & time 03/15/13  1414 History   First MD Initiated Contact with Patient 03/15/13 1536     Chief Complaint  Patient presents with  . Hematuria  . Back Pain      HPI Comments: Patient awoke this morning with right flank pain and gross hematuria.  She had nausea and one episode of vomiting.  No fevers, chills, and sweats.  For the last 1.5 hours she has had mild right groin pain.  Her hematuria has decreased.  No dysuria, frequency, or hesitancy.  She has been evaluated in the ER several times for possible kidney stones with flank pain and hematuria. She has had 4 CT scans this year that have showed renal stones but new ureteral stones.  She states she has not followed up with a urologist other than November, 2013, she did have a cystoscopy related to her ongoing hematuria. She has a prior history of renal cell carcinoma with a partial kidney resection at [facility].  Past history also includes hysterectomy  She visited the [facility] ED on January 01, 2013, for complaint of flank pain and hematuria.  A one-view abdomen X-ray showed no suspicious calcifications.  A urine culture from that visit revealed Klebsiella pneumoniae, and her symptoms subsequently resolved with Cipro.  She states that she had several kidney stones retrieved in 2001 through 2005.  She states that she has passed several stones in the past.        Patient is a 42 y.o. female presenting with flank pain. The history is provided by the patient.  Flank Pain This is a recurrent problem. The current episode started 6 to 12 hours ago. The problem occurs constantly. The problem has been gradually improving. Pertinent negatives include no chest pain, no abdominal pain and no shortness of breath. Nothing aggravates the symptoms. Nothing relieves the symptoms. She has tried nothing for the symptoms.    Past Medical History  Diagnosis Date  . Renal disorder   . Thyroid disease   .  Hypertension   . Polycystic ovarian syndrome   . GERD (gastroesophageal reflux disease)   . Kidney stones   . History of kidney cancer   . Kidney stones   . Cancer     renal  . Thyroid cancer    Past Surgical History  Procedure Laterality Date  . Cesarean section    . Tonsillectomy    . Abdominal hysterectomy    . Partial nephrectomy     Family History  Problem Relation Age of Onset  . Hypertension Mother   . Diabetes Mother   . Hypertension Father   . Cancer Father    History  Substance Use Topics  . Smoking status: Never Smoker   . Smokeless tobacco: Never Used  . Alcohol Use: No   OB History   Grav Para Term Preterm Abortions TAB SAB Ect Mult Living                 Review of Systems  Unable to perform ROS Respiratory: Negative for shortness of breath.   Cardiovascular: Negative for chest pain.  Gastrointestinal: Negative for abdominal pain.  Genitourinary: Positive for flank pain.  All other systems reviewed and are negative.    Allergies  Demerol  Home Medications   Current Outpatient Rx  Name  Route  Sig  Dispense  Refill  . cholecalciferol (VITAMIN D) 1000 UNITS tablet   Oral   Take 1,000  Units by mouth daily.         . ciprofloxacin (CIPRO) 500 MG tablet   Oral   Take 1 tablet (500 mg total) by mouth 2 (two) times daily. One po bid x 10 days   20 tablet   0   . fish oil-omega-3 fatty acids 1000 MG capsule   Oral   Take 1 g by mouth 2 (two) times daily.         . hydrochlorothiazide (HYDRODIURIL) 25 MG tablet   Oral   Take 25 mg by mouth every morning.          Marland Kitchen HYDROcodone-acetaminophen (NORCO/VICODIN) 5-325 MG per tablet      Take one by mouth at bedtime as needed for pain   10 tablet   0   . ibuprofen (ADVIL,MOTRIN) 200 MG tablet   Oral   Take 600 mg by mouth every 8 (eight) hours as needed for pain.          Marland Kitchen levothyroxine (SYNTHROID, LEVOTHROID) 150 MCG tablet   Oral   Take 150 mcg by mouth every morning.          . metoprolol tartrate (LOPRESSOR) 25 MG tablet   Oral   Take 25 mg by mouth 2 (two) times daily.         . Multiple Vitamin (MULITIVITAMIN WITH MINERALS) TABS   Oral   Take 1 tablet by mouth every morning.          Marland Kitchen omeprazole (PRILOSEC) 20 MG capsule   Oral   Take 20 mg by mouth 2 (two) times daily.          Marland Kitchen oxyCODONE-acetaminophen (PERCOCET) 5-325 MG per tablet   Oral   Take 2 tablets by mouth every 4 (four) hours as needed for pain.   20 tablet   0   . promethazine (PHENERGAN) 25 MG tablet   Oral   Take 1 tablet (25 mg total) by mouth every 6 (six) hours as needed for nausea.   30 tablet   0   . sertraline (ZOLOFT) 100 MG tablet   Oral   Take 100 mg by mouth every morning.          BP 140/101  Pulse 82  Temp(Src) 98.4 F (36.9 C) (Oral)  Ht 5\' 11"  (1.803 m)  Wt 294 lb (133.358 kg)  BMI 41.02 kg/m2  SpO2 94% Physical Exam  Nursing note and vitals reviewed. Constitutional: She is oriented to person, place, and time. She appears well-developed and well-nourished. No distress.  Patient is obese (BMI 41.0)  HENT:  Head: Normocephalic.  Nose: Nose normal.  Mouth/Throat: Oropharynx is clear and moist.  Eyes: Conjunctivae are normal. Pupils are equal, round, and reactive to light.  Neck: Neck supple.  Cardiovascular: Normal heart sounds.   Pulmonary/Chest: Breath sounds normal.  Abdominal: Soft. Bowel sounds are normal. She exhibits no distension and no mass. There is no rebound and no guarding.  Mild right flank tenderness present.  No hypogastric tenderness.  Lymphadenopathy:    She has no cervical adenopathy.  Neurological: She is alert and oriented to person, place, and time.  Skin: Skin is warm and dry. No rash noted.    ED Course  Procedures  none    Labs Reviewed  POCT URINALYSIS DIP (MANUAL ENTRY) - BLO large; LEU trace  URINE CULTURE pending         MDM   1. Hematuria   2. Right flank pain  With a past history of multiple  kidney stones and multiple CT scans will treat conservatively. Urine culture pending.  Begin Cipro for 10 days.  Lortab for pain at night. Increase fluid intake.  May take Tylenol daytime for pain.  May continue Zofran for nausea as needed. If symptoms become significantly worse during the night or over the weekend, proceed to the local emergency room.  Followup with urologist if not improving in about 4 to 5 days.    Lattie Haw, MD 03/16/13 2004

## 2013-03-17 LAB — URINE CULTURE

## 2013-03-24 ENCOUNTER — Telehealth: Payer: Self-pay | Admitting: Emergency Medicine

## 2013-05-09 IMAGING — CT CT ABD-PELV W/O CM
1 series · 14 of 21 positions shown, 19 images · non-contrast
Comparison: CT abdomen pelvis of 06/13/2012

CLINICAL DATA: Right flank pain, hematuria

CT ABDOMEN AND PELVIS WITHOUT CONTRAST
TECHNIQUE: Multidetector CT imaging of the abdomen and pelvis was
performed following the standard protocol without intravenous
contrast.

[Series 6: lung · axial · 0.86mm/px · z∈[+1522,+1612]mm · 14 of 21 slices shown, 19 images]
[im 2/21  soft-tissue]
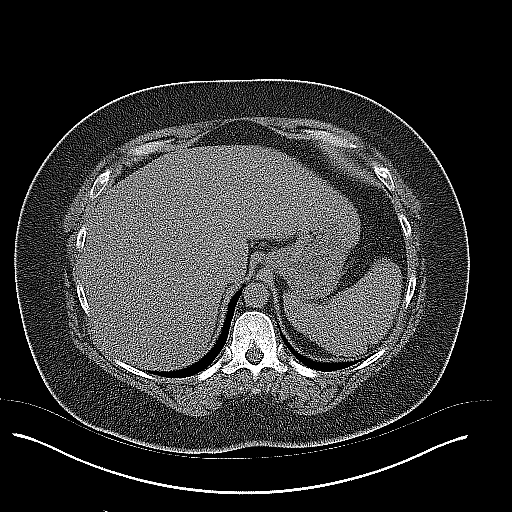
[im 2/21  bone]
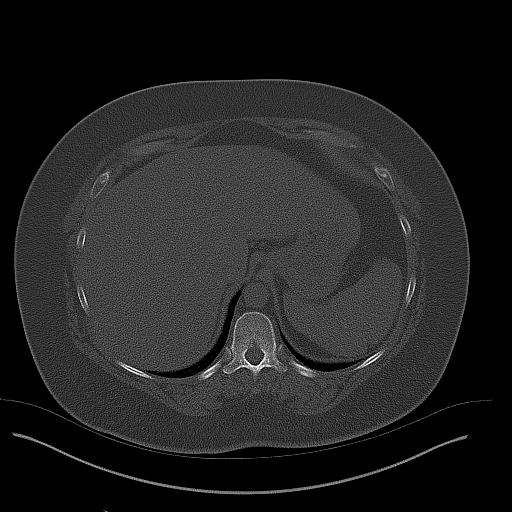
[im 4/21  soft-tissue]
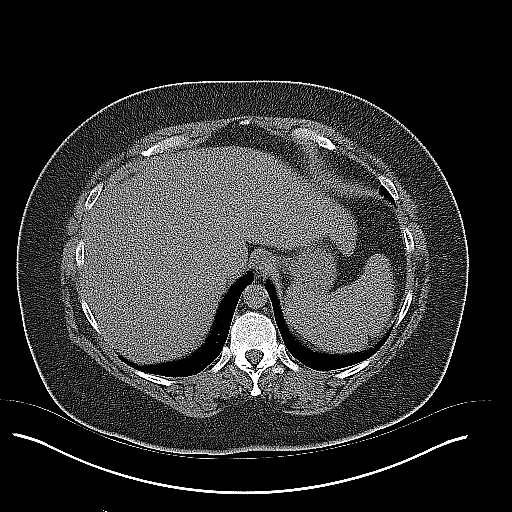
[im 5/21  soft-tissue]
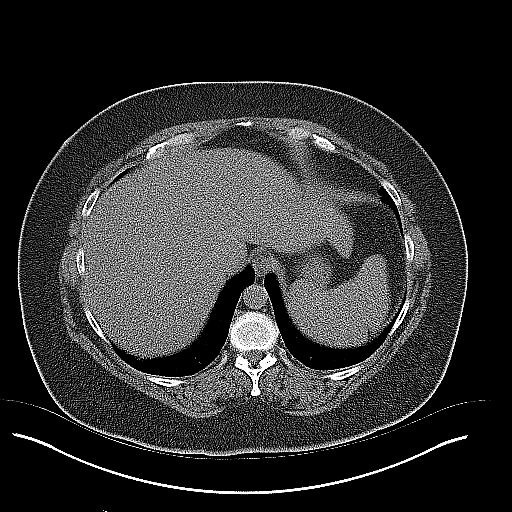
[im 7/21  soft-tissue]
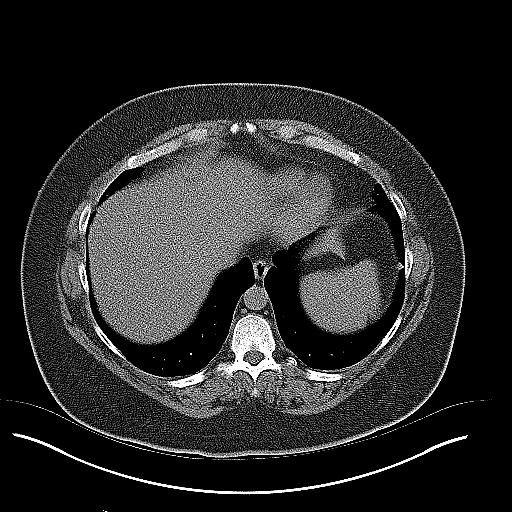
[im 8/21  soft-tissue]
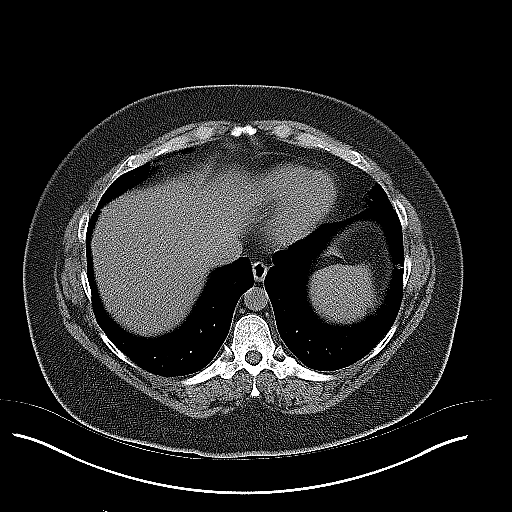
[im 10/21  soft-tissue]
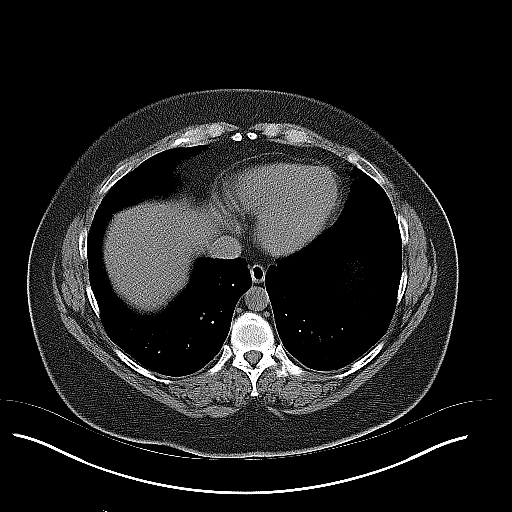
[im 11/21  soft-tissue]
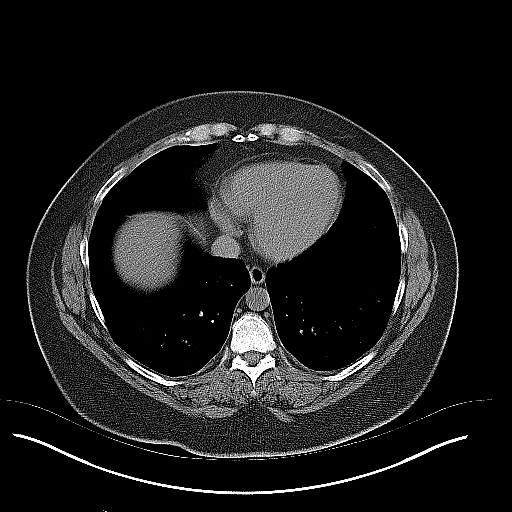
[im 12/21  soft-tissue]
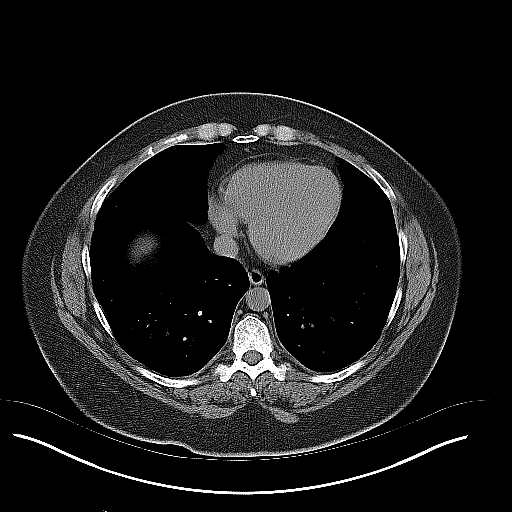
[im 14/21  soft-tissue]
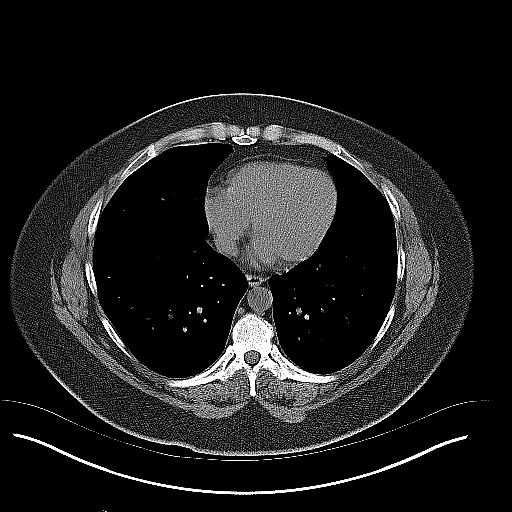
[im 14/21  bone]
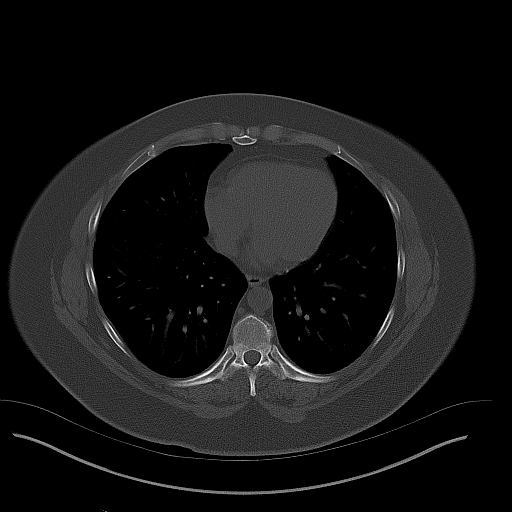
[im 15/21  soft-tissue]
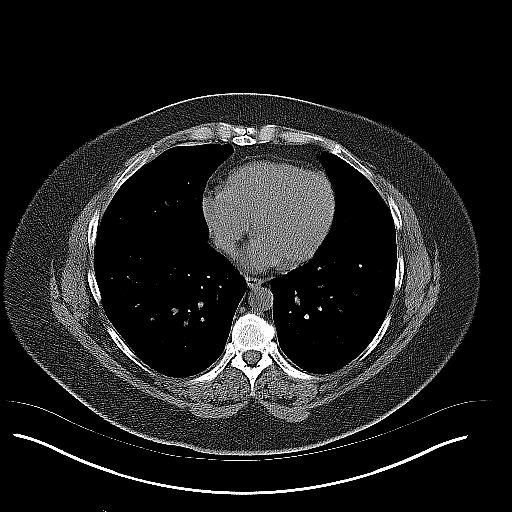
[im 17/21  soft-tissue]
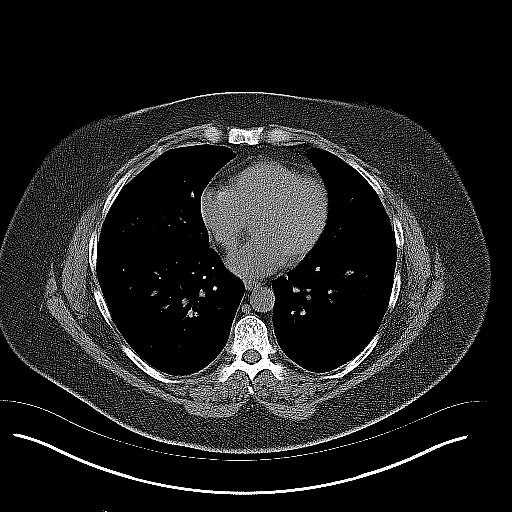
[im 17/21  lung]
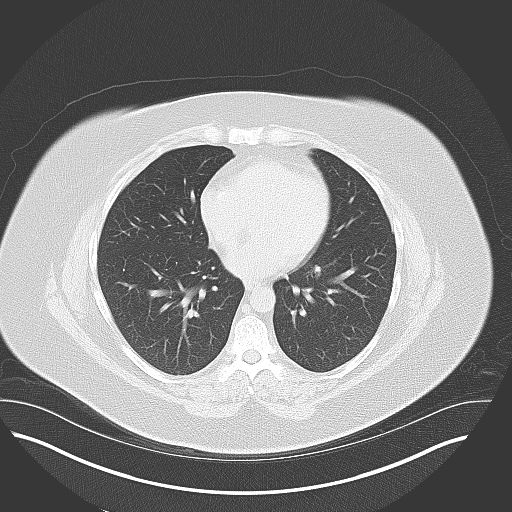
[im 18/21  soft-tissue]
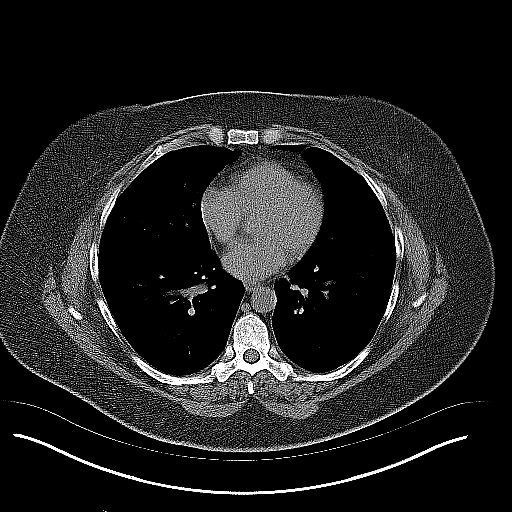
[im 18/21  lung]
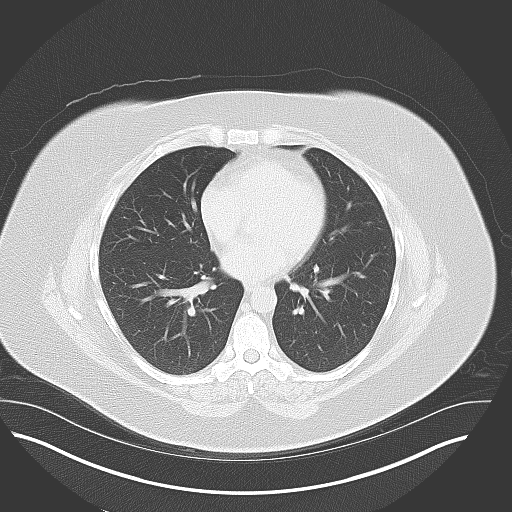
[im 19/21  lung]
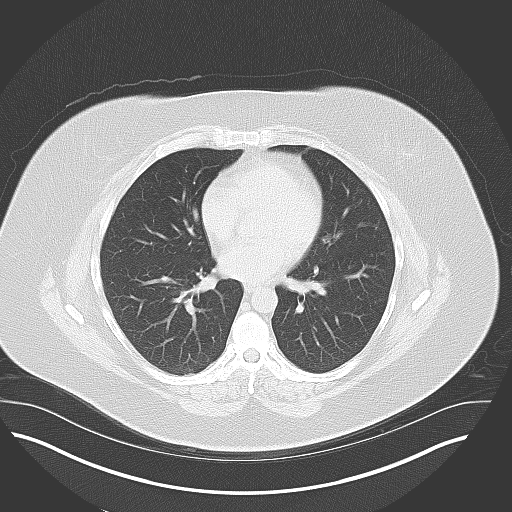
[im 20/21  soft-tissue]
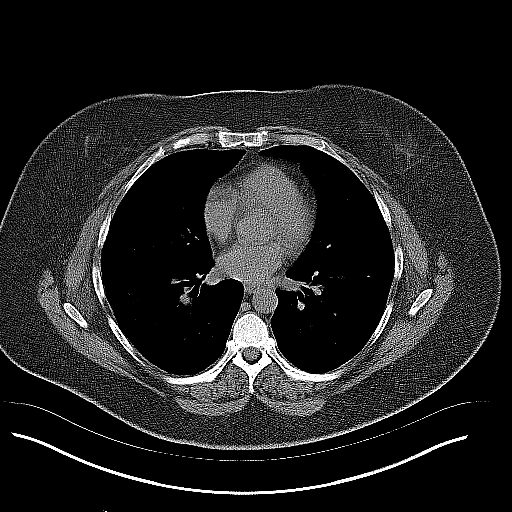
[im 20/21  lung]
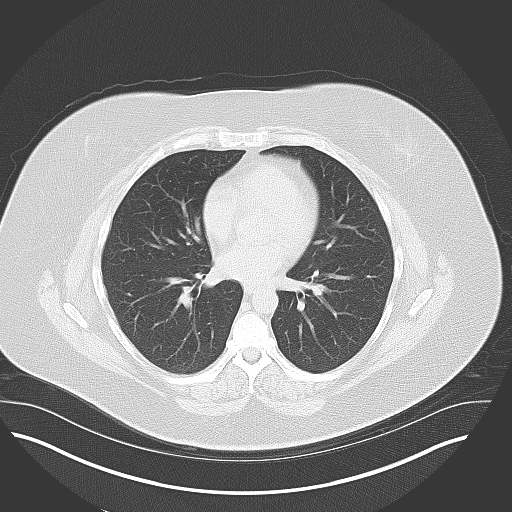

[14 of 21 positions shown; findings below may reference images not displayed]

FINDINGS: The lung bases are clear.  As noted previous of the liver
is somewhat low attenuation consistent with fatty infiltration with
areas of sparing.  The gallbladder is contracted and no calcified
gallstones are seen.  The pancreas is normal in size and the
pancreatic duct is not dilated.  The adrenal glands and spleen are
unremarkable.  The stomach is decompressed. There may be a tiny
nonobstructing right renal calculus in the lower pole of no more
than 2 mm in diameter.  No skeletal abnormality is seen.  The right
kidney is noted the larger than the left, but this appears
unchanged compared to the prior CT.  No hydronephrosis is seen and
the ureters appear normal in caliber.  The abdominal aorta appears
normal.  No adenopathy is seen.

The distal ureters are normal in caliber to the bladder.  The
urinary bladder is decompressed.  The uterus has previously been
resected.  Small left ovarian cysts are noted.  No free fluid is
seen within the pelvis.  No abnormality of the colon is seen.  The
terminal ileum and the appendix are unremarkable.
IMPRESSION: 1.  No hydronephrosis.  Only a tiny 2 mm right lower pole renal
calculus is noted.  The ureters are normal in caliber.
2.  Probable fatty infiltration of the liver.

## 2013-05-17 ENCOUNTER — Emergency Department (HOSPITAL_COMMUNITY): Payer: BC Managed Care – PPO

## 2013-05-17 ENCOUNTER — Emergency Department (HOSPITAL_COMMUNITY)
Admission: EM | Admit: 2013-05-17 | Discharge: 2013-05-18 | Disposition: A | Discharge status: Home / Self Care | Payer: BC Managed Care – PPO | Attending: Emergency Medicine | Admitting: Emergency Medicine

## 2013-05-17 ENCOUNTER — Encounter (HOSPITAL_COMMUNITY): Payer: Self-pay | Admitting: Emergency Medicine

## 2013-05-17 DIAGNOSIS — Z87442 Personal history of urinary calculi: Secondary | ICD-10-CM | POA: Insufficient documentation

## 2013-05-17 DIAGNOSIS — Z85528 Personal history of other malignant neoplasm of kidney: Secondary | ICD-10-CM | POA: Insufficient documentation

## 2013-05-17 DIAGNOSIS — Z3202 Encounter for pregnancy test, result negative: Secondary | ICD-10-CM | POA: Insufficient documentation

## 2013-05-17 DIAGNOSIS — Z9089 Acquired absence of other organs: Secondary | ICD-10-CM | POA: Insufficient documentation

## 2013-05-17 DIAGNOSIS — R1031 Right lower quadrant pain: Secondary | ICD-10-CM | POA: Insufficient documentation

## 2013-05-17 DIAGNOSIS — I10 Essential (primary) hypertension: Secondary | ICD-10-CM | POA: Insufficient documentation

## 2013-05-17 DIAGNOSIS — R197 Diarrhea, unspecified: Secondary | ICD-10-CM | POA: Insufficient documentation

## 2013-05-17 DIAGNOSIS — N390 Urinary tract infection, site not specified: Secondary | ICD-10-CM | POA: Insufficient documentation

## 2013-05-17 DIAGNOSIS — Z85850 Personal history of malignant neoplasm of thyroid: Secondary | ICD-10-CM | POA: Insufficient documentation

## 2013-05-17 DIAGNOSIS — E079 Disorder of thyroid, unspecified: Secondary | ICD-10-CM | POA: Insufficient documentation

## 2013-05-17 DIAGNOSIS — R110 Nausea: Secondary | ICD-10-CM | POA: Insufficient documentation

## 2013-05-17 DIAGNOSIS — Z90710 Acquired absence of both cervix and uterus: Secondary | ICD-10-CM | POA: Insufficient documentation

## 2013-05-17 DIAGNOSIS — K219 Gastro-esophageal reflux disease without esophagitis: Secondary | ICD-10-CM | POA: Insufficient documentation

## 2013-05-17 DIAGNOSIS — Z79899 Other long term (current) drug therapy: Secondary | ICD-10-CM | POA: Insufficient documentation

## 2013-05-17 LAB — COMPREHENSIVE METABOLIC PANEL
AST: 67 U/L — ABNORMAL HIGH (ref 0–37)
AST: 65 U/L — ABNORMAL HIGH (ref 0–37)
Albumin: 4.3 g/dL (ref 3.5–5.2)
CO2: 23 mEq/L (ref 19–32)
Calcium: 9.8 mg/dL (ref 8.4–10.5)
Chloride: 97 mEq/L (ref 96–112)
Creatinine, Ser: 0.73 mg/dL (ref 0.50–1.10)
Creatinine, Ser: 0.7 mg/dL (ref 0.50–1.10)
GFR calc non Af Amer: >90 mL/min (ref >90)
Glucose, Bld: 113 mg/dL — ABNORMAL HIGH (ref 70–99)
Total Bilirubin: 0.2 mg/dL — ABNORMAL LOW (ref 0.3–1.2)

## 2013-05-17 LAB — CBC WITH DIFFERENTIAL/PLATELET
Basophils Absolute: 0 10*3/uL (ref 0.0–0.1)
Basophils Absolute: 0 K/uL (ref 0.0–0.1)
Basophils Relative: 0 % (ref 0–1)
Eosinophils Absolute: 0.3 K/uL (ref 0.0–0.7)
Eosinophils Relative: 2 % (ref 0–5)
HCT: 41.3 % (ref 36.0–46.0)
HCT: 39.1 % (ref 36.0–46.0)
Hemoglobin: 13.9 g/dL (ref 12.0–15.0)
Hemoglobin: 13.2 g/dL (ref 12.0–15.0)
Lymphocytes Relative: 25 % (ref 12–46)
Lymphocytes Relative: 28 % (ref 12–46)
Lymphs Abs: 2.9 10*3/uL (ref 0.7–4.0)
Lymphs Abs: 3.9 K/uL (ref 0.7–4.0)
MCH: 27.9 pg (ref 26.0–34.0)
MCHC: 33.8 g/dL (ref 30.0–36.0)
MCV: 82.7 fL (ref 78.0–100.0)
Monocytes Absolute: 0.8 10*3/uL (ref 0.1–1.0)
Monocytes Absolute: 0.8 K/uL (ref 0.1–1.0)
Monocytes Relative: 7 % (ref 3–12)
Monocytes Relative: 6 % (ref 3–12)
Neutro Abs: 7.8 10*3/uL — ABNORMAL HIGH (ref 1.7–7.7)
Neutro Abs: 8.8 K/uL — ABNORMAL HIGH (ref 1.7–7.7)
Neutrophils Relative %: 63 % (ref 43–77)
Platelets: 333 K/uL (ref 150–400)
RBC: 4.97 MIL/uL (ref 3.87–5.11)
RBC: 4.73 MIL/uL (ref 3.87–5.11)
RDW: 13.5 % (ref 11.5–15.5)
WBC: 11.8 10*3/uL — ABNORMAL HIGH (ref 4.0–10.5)
WBC: 13.8 K/uL — ABNORMAL HIGH (ref 4.0–10.5)

## 2013-05-17 LAB — URINALYSIS, ROUTINE W REFLEX MICROSCOPIC
Glucose, UA: NEGATIVE mg/dL
Ketones, ur: NEGATIVE mg/dL
Protein, ur: 30 mg/dL — AB

## 2013-05-17 LAB — URINE MICROSCOPIC-ADD ON

## 2013-05-17 MED ORDER — DEXTROSE 5 % IV SOLN
1.0000 g | Freq: Once | INTRAVENOUS | Status: AC
  Administered 2013-05-18: 1 g via INTRAVENOUS
  Filled 2013-05-17: qty 10

## 2013-05-17 MED ORDER — MORPHINE SULFATE 4 MG/ML IJ SOLN
6.0000 mg | Freq: Once | INTRAMUSCULAR | Status: AC
  Administered 2013-05-18: 6 mg via INTRAVENOUS
  Filled 2013-05-17: qty 2

## 2013-05-17 MED ORDER — ONDANSETRON HCL 4 MG/2ML IJ SOLN
4.0000 mg | Freq: Once | INTRAMUSCULAR | Status: AC
  Administered 2013-05-17: 4 mg via INTRAVENOUS
  Filled 2013-05-17: qty 2

## 2013-05-17 MED ORDER — MORPHINE SULFATE 4 MG/ML IJ SOLN
4.0000 mg | Freq: Once | INTRAMUSCULAR | Status: AC
  Administered 2013-05-17: 4 mg via INTRAVENOUS
  Filled 2013-05-17: qty 1

## 2013-05-17 NOTE — ED Notes (Signed)
Pt c/o low abd pain that comes and goes around 1300 today.  States hx of kidney stones and ovarian cysts and states it may be one of those.  States that her urine looked "pinkish".

## 2013-05-17 NOTE — ED Notes (Signed)
Pt states she has abdominal pain right lower quadrant 5/10 but tolerable

## 2013-05-17 NOTE — ED Notes (Signed)
Pt returned from CT  Pt is very nauseated and diaphoretic,  Pt also continues in pain 10/10,  Follow medication order

## 2013-05-18 MED ORDER — MORPHINE SULFATE 4 MG/ML IJ SOLN
4.0000 mg | Freq: Once | INTRAMUSCULAR | Status: AC
  Administered 2013-05-18: 4 mg via INTRAVENOUS
  Filled 2013-05-18: qty 1

## 2013-05-18 MED ORDER — HYDROCODONE-ACETAMINOPHEN 5-325 MG PO TABS: 1.0000 | ORAL_TABLET | Freq: Four times a day (QID) | ORAL | Status: DC | PRN

## 2013-05-18 MED ORDER — CEFPODOXIME PROXETIL 100 MG PO TABS: 100.0000 mg | ORAL_TABLET | Freq: Two times a day (BID) | ORAL | Status: DC

## 2013-05-18 NOTE — ED Provider Notes (Signed)
CSN: [id number]     Arrival date & time 05/17/13  1511 History   First MD Initiated Contact with Patient 05/17/13 2121     Chief Complaint  Patient presents with  . Abdominal Pain   (Consider location/radiation/quality/duration/timing/severity/associated sxs/prior Treatment) Patient is a 42 y.o. female presenting with abdominal pain. The history is provided by the patient. No language interpreter was used.  Abdominal Pain Pain location:  RLQ Pain quality: sharp   Pain radiates to:  R flank Pain severity:  Moderate Onset quality:  Sudden Duration:  8 hours Timing:  Intermittent Progression:  Waxing and waning Chronicity:  New Context: previous surgery   Context: not recent illness, not recent travel, not sick contacts and not suspicious food intake   Relieved by:  Nothing Worsened by:  Nothing tried Ineffective treatments:  None tried Associated symptoms: diarrhea, hematuria and nausea   Associated symptoms: no chest pain, no chills, no cough, no dysuria, no fatigue, no fever, no shortness of breath, no sore throat and no vomiting   Diarrhea:    Quality:  Watery   Number of occurrences:  3   Severity:  Mild Risk factors: multiple surgeries and obesity   Risk factors: no NSAID use, not pregnant and no recent hospitalization     Past Medical History  Diagnosis Date  . Renal disorder   . Thyroid disease   . Hypertension   . Polycystic ovarian syndrome   . GERD (gastroesophageal reflux disease)   . Kidney stones   . History of kidney cancer   . Kidney stones   . Cancer     renal  . Thyroid cancer    Past Surgical History  Procedure Laterality Date  . Cesarean section    . Tonsillectomy    . Abdominal hysterectomy    . Partial nephrectomy     Family History  Problem Relation Age of Onset  . Hypertension Mother   . Diabetes Mother   . Hypertension Father   . Cancer Father    History  Substance Use Topics  . Smoking status: Never Smoker   . Smokeless tobacco:  Never Used  . Alcohol Use: No   OB History   Grav Para Term Preterm Abortions TAB SAB Ect Mult Living                 Review of Systems  Constitutional: Negative for fever, chills, diaphoresis, activity change, appetite change and fatigue.  HENT: Negative for congestion, facial swelling, rhinorrhea and sore throat.   Eyes: Negative for photophobia and discharge.  Respiratory: Negative for cough, chest tightness and shortness of breath.   Cardiovascular: Negative for chest pain, palpitations and leg swelling.  Gastrointestinal: Positive for nausea, abdominal pain and diarrhea. Negative for vomiting.  Endocrine: Negative for polydipsia and polyuria.  Genitourinary: Positive for hematuria. Negative for dysuria, frequency, difficulty urinating and pelvic pain.  Musculoskeletal: Negative for arthralgias, back pain, neck pain and neck stiffness.  Skin: Negative for color change and wound.  Allergic/Immunologic: Negative for immunocompromised state.  Neurological: Negative for facial asymmetry, weakness, numbness and headaches.  Hematological: Does not bruise/bleed easily.  Psychiatric/Behavioral: Negative for confusion and agitation.    Allergies  Demerol  Home Medications   Current Outpatient Rx  Name  Route  Sig  Dispense  Refill  . cholecalciferol (VITAMIN D) 1000 UNITS tablet   Oral   Take 1,000 Units by mouth daily.         . fish oil-omega-3 fatty acids 1000 MG  capsule   Oral   Take 1 g by mouth 2 (two) times daily.         . hydrochlorothiazide (HYDRODIURIL) 25 MG tablet   Oral   Take 25 mg by mouth every morning.          Marland Kitchen ibuprofen (ADVIL,MOTRIN) 200 MG tablet   Oral   Take 600 mg by mouth every 8 (eight) hours as needed for pain.          Marland Kitchen levothyroxine (SYNTHROID, LEVOTHROID) 150 MCG tablet   Oral   Take 150 mcg by mouth every morning.         . metoprolol tartrate (LOPRESSOR) 25 MG tablet   Oral   Take 25 mg by mouth 2 (two) times daily.          . Multiple Vitamin (MULITIVITAMIN WITH MINERALS) TABS   Oral   Take 1 tablet by mouth every morning.          Marland Kitchen omeprazole (PRILOSEC) 20 MG capsule   Oral   Take 20 mg by mouth 2 (two) times daily.          . sertraline (ZOLOFT) 100 MG tablet   Oral   Take 100 mg by mouth every morning.         . cefpodoxime (VANTIN) 100 MG tablet   Oral   Take 1 tablet (100 mg total) by mouth 2 (two) times daily.   14 tablet   0   . HYDROcodone-acetaminophen (NORCO) 5-325 MG per tablet   Oral   Take 1 tablet by mouth every 6 (six) hours as needed.   10 tablet   0    BP 146/76  Pulse 89  Temp(Src) 98.3 F (36.8 C) (Oral)  Resp 19  SpO2 94% Physical Exam  Constitutional: She is oriented to person, place, and time. She appears well-developed and well-nourished. No distress.  HENT:  Head: Normocephalic and atraumatic.  Mouth/Throat: No oropharyngeal exudate.  Eyes: Pupils are equal, round, and reactive to light.  Neck: Normal range of motion. Neck supple.  Cardiovascular: Normal rate, regular rhythm and normal heart sounds.  Exam reveals no gallop and no friction rub.   No murmur heard. Pulmonary/Chest: Effort normal and breath sounds normal. No respiratory distress. She has no wheezes. She has no rales.  Abdominal: Soft. Bowel sounds are normal. She exhibits no distension and no mass. There is no tenderness. There is CVA tenderness (right). There is no rebound and no guarding.  Musculoskeletal: Normal range of motion. She exhibits no edema and no tenderness.  Neurological: She is alert and oriented to person, place, and time.  Skin: Skin is warm and dry.  Psychiatric: She has a normal mood and affect.    ED Course  Procedures (including critical care time) Labs Review Labs Reviewed  CBC WITH DIFFERENTIAL - Abnormal; Notable for the following:    WBC 11.8 (*)    Neutro Abs 7.8 (*)    All other components within normal limits  COMPREHENSIVE METABOLIC PANEL - Abnormal;  Notable for the following:    Glucose, Bld 113 (*)    AST 67 (*)    ALT 85 (*)    Total Bilirubin 0.2 (*)    All other components within normal limits  URINALYSIS, ROUTINE W REFLEX MICROSCOPIC - Abnormal; Notable for the following:    Color, Urine AMBER (*)    APPearance CLOUDY (*)    Hgb urine dipstick LARGE (*)    Protein, ur 30 (*)  Leukocytes, UA MODERATE (*)    All other components within normal limits  CBC WITH DIFFERENTIAL - Abnormal; Notable for the following:    WBC 13.8 (*)    Neutro Abs 8.8 (*)    All other components within normal limits  COMPREHENSIVE METABOLIC PANEL - Abnormal; Notable for the following:    AST 65 (*)    ALT 87 (*)    Total Bilirubin 0.2 (*)    All other components within normal limits  URINE MICROSCOPIC-ADD ON - Abnormal; Notable for the following:    Squamous Epithelial / LPF FEW (*)    Bacteria, UA FEW (*)    All other components within normal limits  URINE CULTURE  LIPASE, BLOOD  POCT PREGNANCY, URINE   Imaging Review Ct Abdomen Pelvis Wo Contrast  05/17/2013   CLINICAL DATA:  Right flank pain, history of kidney stones and right partial nephrectomy as well as hysterectomy.  EXAM: CT ABDOMEN AND PELVIS WITHOUT CONTRAST  TECHNIQUE: Multidetector CT imaging of the abdomen and pelvis was performed following the standard protocol without intravenous contrast.  COMPARISON:  CT of the abdomen and pelvis October 20, 2012.  FINDINGS: Limited view of the lung bases demonstrate left lower lobe 7 mm pulmonary nodule, unchanged in size from prior examination. Included heart and pericardium are unremarkable.  Kidneys are well located, no hydronephrosis. Limited assessment for renal masses on this nonenhanced examination. Punctate calculi left interpolar and lower pole. The ureters are normal in course and caliber. Urinary bladder is partially distended, harboring no intravesicular calculi.  Fatty liver, the liver is otherwise unremarkable for this nonenhanced  examination. The gallbladder, spleen, pancreas, adrenal glands are unremarkable.  Stomach, small and large bowel are normal in course and caliber without inflammatory changes. Normal appendix. No intraperitoneal free fluid nor free air.  Great vessels are normal in course and caliber with mild calcific atherosclerosis. Status post hysterectomy. Left adnexal low-density 4 x 3.2 cm benign-appearing cyst, no followup recommendations. Soft tissues are nonsuspicious. Mild bilateral sacroiliac osteoarthrosis. Osseous structures are nonsuspicious.  IMPRESSION: Punctate nonobstructing left nephrolithiasis.  Normal appendix.  Fatty liver.   Electronically Signed   By: Awilda Metro   On: 05/17/2013 23:13    EKG Interpretation   None       MDM   1. RLQ abdominal pain   2. UTI (lower urinary tract infection)    Pt is a 42 y.o. female with Pmhx as above who presents with intermittent RLQ pain since about 11am today w/ radiation to low back, hematuria.  No n/v, fever.  Had some d/a this evening. On PE, pt uncomfortable appearing, but in NAD.  No reproducible RLQ pain, though had R CVA tenderness.  WBC 13.8.  Mild AST/ALT elevations. UA w/ leukocyts & Hb concerning for UTI. CT stone study ordered with acute acute findings to explain pain. Appendix nml.  Pt given several doses IV pain meds, states she feel similar, but appear more comfortable.  Doubt acute surgical cause of pain such as SBO appendicitis, diverticulitis.  .Plan for d/c home once pain controlled with vantin/norco and close pcp f/u.  Return precautions given for new or worsening symptoms including worsening pain, fever, inability to tolerate po         Shanna Cisco, MD 05/18/13 1248

## 2013-05-19 LAB — URINE CULTURE

## 2013-07-19 ENCOUNTER — Emergency Department (HOSPITAL_COMMUNITY): Payer: BC Managed Care – PPO

## 2013-07-19 ENCOUNTER — Encounter (HOSPITAL_COMMUNITY): Payer: Self-pay | Admitting: Emergency Medicine

## 2013-07-19 ENCOUNTER — Emergency Department (HOSPITAL_COMMUNITY)
Admission: EM | Admit: 2013-07-19 | Discharge: 2013-07-19 | Disposition: A | Discharge status: Home / Self Care | Payer: BC Managed Care – PPO | Attending: Emergency Medicine | Admitting: Emergency Medicine

## 2013-07-19 DIAGNOSIS — Z905 Acquired absence of kidney: Secondary | ICD-10-CM | POA: Insufficient documentation

## 2013-07-19 DIAGNOSIS — K219 Gastro-esophageal reflux disease without esophagitis: Secondary | ICD-10-CM | POA: Insufficient documentation

## 2013-07-19 DIAGNOSIS — I10 Essential (primary) hypertension: Secondary | ICD-10-CM | POA: Insufficient documentation

## 2013-07-19 DIAGNOSIS — Z90710 Acquired absence of both cervix and uterus: Secondary | ICD-10-CM | POA: Insufficient documentation

## 2013-07-19 DIAGNOSIS — Z792 Long term (current) use of antibiotics: Secondary | ICD-10-CM | POA: Insufficient documentation

## 2013-07-19 DIAGNOSIS — Z79899 Other long term (current) drug therapy: Secondary | ICD-10-CM | POA: Insufficient documentation

## 2013-07-19 DIAGNOSIS — Z87442 Personal history of urinary calculi: Secondary | ICD-10-CM | POA: Insufficient documentation

## 2013-07-19 DIAGNOSIS — Z85528 Personal history of other malignant neoplasm of kidney: Secondary | ICD-10-CM | POA: Insufficient documentation

## 2013-07-19 DIAGNOSIS — Z8742 Personal history of other diseases of the female genital tract: Secondary | ICD-10-CM | POA: Insufficient documentation

## 2013-07-19 DIAGNOSIS — R319 Hematuria, unspecified: Secondary | ICD-10-CM | POA: Insufficient documentation

## 2013-07-19 DIAGNOSIS — Z85850 Personal history of malignant neoplasm of thyroid: Secondary | ICD-10-CM | POA: Insufficient documentation

## 2013-07-19 DIAGNOSIS — R109 Unspecified abdominal pain: Secondary | ICD-10-CM | POA: Insufficient documentation

## 2013-07-19 DIAGNOSIS — E039 Hypothyroidism, unspecified: Secondary | ICD-10-CM | POA: Insufficient documentation

## 2013-07-19 LAB — URINALYSIS, ROUTINE W REFLEX MICROSCOPIC
GLUCOSE, UA: NEGATIVE mg/dL
Ketones, ur: NEGATIVE mg/dL
Nitrite: NEGATIVE
PH: 6.5 (ref 5.0–8.0)
Protein, ur: 30 mg/dL — AB
SPECIFIC GRAVITY, URINE: 1.027 (ref 1.005–1.030)
UROBILINOGEN UA: 0.2 mg/dL (ref 0.0–1.0)

## 2013-07-19 LAB — BASIC METABOLIC PANEL
BUN: 15 mg/dL (ref 6–23)
CHLORIDE: 98 meq/L (ref 96–112)
CO2: 22 mEq/L (ref 19–32)
Calcium: 9.5 mg/dL (ref 8.4–10.5)
Creatinine, Ser: 0.69 mg/dL (ref 0.50–1.10)
GFR calc non Af Amer: >90 mL/min (ref >90)
GLUCOSE: 142 mg/dL — AB (ref 70–99)
POTASSIUM: 3.1 meq/L — AB (ref 3.7–5.3)
Sodium: 138 mEq/L (ref 137–147)

## 2013-07-19 LAB — CBC WITH DIFFERENTIAL/PLATELET
BASOS PCT: 0 % (ref 0–1)
Basophils Absolute: 0.1 10*3/uL (ref 0.0–0.1)
Eosinophils Absolute: 0.2 10*3/uL (ref 0.0–0.7)
Eosinophils Relative: 2 % (ref 0–5)
HEMATOCRIT: 40.1 % (ref 36.0–46.0)
HEMOGLOBIN: 13.6 g/dL (ref 12.0–15.0)
LYMPHS ABS: 2.8 10*3/uL (ref 0.7–4.0)
Lymphocytes Relative: 24 % (ref 12–46)
MCH: 28.2 pg (ref 26.0–34.0)
MCHC: 33.9 g/dL (ref 30.0–36.0)
MCV: 83.2 fL (ref 78.0–100.0)
MONO ABS: 0.6 10*3/uL (ref 0.1–1.0)
Monocytes Relative: 6 % (ref 3–12)
NEUTROS ABS: 7.7 10*3/uL (ref 1.7–7.7)
Neutrophils Relative %: 68 % (ref 43–77)
Platelets: 313 10*3/uL (ref 150–400)
RBC: 4.82 MIL/uL (ref 3.87–5.11)
RDW: 13.9 % (ref 11.5–15.5)
WBC: 11.3 10*3/uL — ABNORMAL HIGH (ref 4.0–10.5)

## 2013-07-19 LAB — URINE MICROSCOPIC-ADD ON

## 2013-07-19 MED ORDER — HYDROMORPHONE HCL PF 1 MG/ML IJ SOLN
1.0000 mg | Freq: Once | INTRAMUSCULAR | Status: AC
  Administered 2013-07-19: 1 mg via INTRAVENOUS
  Filled 2013-07-19: qty 1

## 2013-07-19 MED ORDER — ONDANSETRON HCL 4 MG/2ML IJ SOLN
4.0000 mg | Freq: Once | INTRAMUSCULAR | Status: AC
  Administered 2013-07-19: 4 mg via INTRAMUSCULAR
  Filled 2013-07-19: qty 2

## 2013-07-19 MED ORDER — SODIUM CHLORIDE 0.9 % IV BOLUS (SEPSIS)
500.0000 mL | Freq: Once | INTRAVENOUS | Status: AC
  Administered 2013-07-19: 500 mL via INTRAVENOUS

## 2013-07-19 MED ORDER — TAMSULOSIN HCL 0.4 MG PO CAPS: 0.4000 mg | ORAL_CAPSULE | Freq: Every day | ORAL | Status: DC

## 2013-07-19 MED ORDER — HYDROMORPHONE HCL PF 1 MG/ML IJ SOLN
0.5000 mg | Freq: Once | INTRAMUSCULAR | Status: AC
  Administered 2013-07-19: 0.5 mg via INTRAVENOUS
  Filled 2013-07-19: qty 1

## 2013-07-19 MED ORDER — OXYCODONE-ACETAMINOPHEN 5-325 MG PO TABS: 2.0000 | ORAL_TABLET | ORAL | Status: DC | PRN

## 2013-07-19 MED ORDER — PROMETHAZINE HCL 25 MG PO TABS: 25.0000 mg | ORAL_TABLET | Freq: Four times a day (QID) | ORAL | Status: DC | PRN

## 2013-07-19 MED ORDER — CIPROFLOXACIN HCL 500 MG PO TABS: 500.0000 mg | ORAL_TABLET | Freq: Two times a day (BID) | ORAL | Status: DC

## 2013-07-19 NOTE — Discharge Instructions (Signed)
CT scan showed no obvious kidney stone. Prescriptions for pain medication, nausea medications, Flomax.    Minor evidence of a urinary infection. Rx Cipro for same. Recommend followup with urologist.

## 2013-07-19 NOTE — ED Notes (Signed)
x2 unsuccessful IV attempt by this RN. Dillon Bjork at bedside attempting IV insertion.

## 2013-07-19 NOTE — ED Notes (Signed)
Pt from home c/o hematuria with R flank pain that started this am. Pt has hx of kidney Ca, had partial nephrectomy for the same approx 4 yrs ago. Pt also has hx of renal stones. Pt is A&O and in NAD

## 2013-07-19 NOTE — ED Provider Notes (Signed)
CSN: [id number]     Arrival date & time 07/19/13  1416 History   First MD Initiated Contact with Patient 07/19/13 1504     Chief Complaint  Patient presents with  . Hematuria  . Flank Pain     (Consider location/radiation/quality/duration/timing/severity/associated sxs/prior Treatment) HPI.... right flank pain and hematuria the past 24 hours. Patient is status post right partial nephrectomy in 2011 at [facility] for renal cell carcinoma. She also has a history of kidney stones. No fever, chills, dysuria. Severity is moderate. Nothing makes symptoms better or worse. Additionally, she has hypothyroidism andher Synthroid was recently increased to 150 mcg a day.  Past Medical History  Diagnosis Date  . Renal disorder   . Thyroid disease   . Hypertension   . Polycystic ovarian syndrome   . GERD (gastroesophageal reflux disease)   . Kidney stones   . History of kidney cancer   . Kidney stones   . Cancer     renal  . Thyroid cancer    Past Surgical History  Procedure Laterality Date  . Cesarean section    . Tonsillectomy    . Abdominal hysterectomy    . Partial nephrectomy     Family History  Problem Relation Age of Onset  . Hypertension Mother   . Diabetes Mother   . Hypertension Father   . Cancer Father    History  Substance Use Topics  . Smoking status: Never Smoker   . Smokeless tobacco: Never Used  . Alcohol Use: No   OB History   Grav Para Term Preterm Abortions TAB SAB Ect Mult Living                 Review of Systems  All other systems reviewed and are negative.      Allergies  Demerol  Home Medications   Current Outpatient Rx  Name  Route  Sig  Dispense  Refill  . cholecalciferol (VITAMIN D) 1000 UNITS tablet   Oral   Take 1,000 Units by mouth daily.         Marland Kitchen co-enzyme Q-10 30 MG capsule   Oral   Take 30 mg by mouth daily.         . fish oil-omega-3 fatty acids 1000 MG capsule   Oral   Take 1 g by mouth 2 (two) times daily.          . hydrochlorothiazide (HYDRODIURIL) 25 MG tablet   Oral   Take 25 mg by mouth every morning.          Marland Kitchen ibuprofen (ADVIL,MOTRIN) 200 MG tablet   Oral   Take 600 mg by mouth every 8 (eight) hours as needed for pain.          Marland Kitchen levothyroxine (SYNTHROID, LEVOTHROID) 175 MCG tablet   Oral   Take 175 mcg by mouth daily before breakfast.         . metoprolol tartrate (LOPRESSOR) 25 MG tablet   Oral   Take 25 mg by mouth 2 (two) times daily.         . Multiple Vitamin (MULITIVITAMIN WITH MINERALS) TABS   Oral   Take 1 tablet by mouth every morning.          Marland Kitchen omeprazole (PRILOSEC) 20 MG capsule   Oral   Take 20 mg by mouth 2 (two) times daily.          . sertraline (ZOLOFT) 100 MG tablet   Oral   Take 100  mg by mouth every morning.         . traMADol (ULTRAM) 50 MG tablet   Oral   Take 50 mg by mouth every 6 (six) hours as needed for moderate pain.          . ciprofloxacin (CIPRO) 500 MG tablet   Oral   Take 1 tablet (500 mg total) by mouth 2 (two) times daily.   14 tablet   0   . oxyCODONE-acetaminophen (PERCOCET) 5-325 MG per tablet   Oral   Take 2 tablets by mouth every 4 (four) hours as needed.   20 tablet   0   . promethazine (PHENERGAN) 25 MG tablet   Oral   Take 1 tablet (25 mg total) by mouth every 6 (six) hours as needed for nausea.   20 tablet   0   . tamsulosin (FLOMAX) 0.4 MG CAPS capsule   Oral   Take 1 capsule (0.4 mg total) by mouth daily.   30 capsule   0    BP 127/66  Pulse 104  Temp(Src) 98.5 F (36.9 C) (Oral)  Resp 16  SpO2 100% Physical Exam  Nursing note and vitals reviewed. Constitutional: She is oriented to person, place, and time. She appears well-developed and well-nourished.  HENT:  Head: Normocephalic and atraumatic.  Eyes: Conjunctivae and EOM are normal. Pupils are equal, round, and reactive to light.  Neck: Normal range of motion. Neck supple.  Cardiovascular: Normal rate, regular rhythm and normal  heart sounds.   Pulmonary/Chest: Effort normal and breath sounds normal.  Abdominal: Soft. Bowel sounds are normal.  Genitourinary:  Right flank tenderness  Musculoskeletal: Normal range of motion.  Neurological: She is alert and oriented to person, place, and time.  Skin: Skin is warm and dry.  Psychiatric: She has a normal mood and affect. Her behavior is normal.    ED Course  Procedures (including critical care time) Labs Review Labs Reviewed  BASIC METABOLIC PANEL - Abnormal; Notable for the following:    Potassium 3.1 (*)    Glucose, Bld 142 (*)    All other components within normal limits  CBC WITH DIFFERENTIAL - Abnormal; Notable for the following:    WBC 11.3 (*)    All other components within normal limits  URINALYSIS, ROUTINE W REFLEX MICROSCOPIC - Abnormal; Notable for the following:    Color, Urine RED (*)    APPearance CLOUDY (*)    Hgb urine dipstick LARGE (*)    Bilirubin Urine SMALL (*)    Protein, ur 30 (*)    Leukocytes, UA MODERATE (*)    All other components within normal limits  URINE MICROSCOPIC-ADD ON   Imaging Review Ct Abdomen Pelvis Wo Contrast  07/19/2013   CLINICAL DATA:  Right flank pain, dysuria, history of partial right nephrectomy for renal cell carcinoma. Prior hysterectomy.  EXAM: CT ABDOMEN AND PELVIS WITHOUT CONTRAST  TECHNIQUE: Multidetector CT imaging of the abdomen and pelvis was performed following the standard protocol without intravenous contrast.  COMPARISON:  05/17/2013  FINDINGS: Lung bases are clear.  Moderate to severe hepatic steatosis.  Unenhanced spleen, pancreas, and adrenal glands are within normal limits.  Gallbladder is notable for layering sludge and/or noncalcified gallstones. No intrahepatic or extrahepatic ductal dilatation.  Status post partial right lower pole nephrectomy. Kidneys are otherwise unremarkable. At least two punctate nonobstructing left upper pole renal calculi (coronal images 75 and 86) and one punctate  nonobstructing right lower pole renal calculus (coronal image 76). No  hydronephrosis.  No evidence of bowel obstruction. Normal appendix. Left colon is decompressed.  No evidence of abdominal aortic aneurysm.  No abdominopelvic ascites.  No suspicious abdominopelvic lymphadenopathy.  Status post hysterectomy. 3.1 x 3.3 cm left ovarian/parovarian cyst (series 2/image 71), stable versus mildly decreased. Right kidney is unremarkable.  No ureteral or bladder calculi.  Bladder is within normal limits.  Calcified pelvic phleboliths.  Mild degenerative changes of the lower thoracic spine.  IMPRESSION: Bilateral punctate nonobstructing renal calculi. No ureteral or bladder calculi. No hydronephrosis.  Status post partial right nephrectomy. No evidence of recurrent or metastatic disease in the abdomen/pelvis.  Moderate to severe hepatic steatosis.   Electronically Signed   By: Julian Hy M.D.   On: 07/19/2013 19:10   Dg Abd 1 View  07/19/2013   CLINICAL DATA:  Right flank pain.  Hematuria.  EXAM: ABDOMEN - 1 VIEW  COMPARISON:  CT, 05/17/2013  FINDINGS: The bowel gas pattern is normal. No radio-opaque calculi or other significant radiographic abnormality are seen.  IMPRESSION: Negative.   Electronically Signed   By: Lajean Manes M.D.   On: 07/19/2013 17:09     EKG Interpretation None      MDM   Final diagnoses:  Right flank pain  Hematuria    Patient feels better after IV pain management. CT scan shows no obvious ureteral stone. Stable post partial right nephrectomy.  Minor evidence of a urinary infection. Discharge medications Percocet, Phenergan 25 mg, Flomax 0.4 mg, Cipro 500 mg. She will get urology followup this week    [name], MD 07/19/13 2018

## 2013-07-21 LAB — URINE CULTURE

## 2013-07-23 ENCOUNTER — Encounter (HOSPITAL_BASED_OUTPATIENT_CLINIC_OR_DEPARTMENT_OTHER): Payer: Self-pay | Admitting: Emergency Medicine

## 2013-07-23 ENCOUNTER — Emergency Department (HOSPITAL_BASED_OUTPATIENT_CLINIC_OR_DEPARTMENT_OTHER)
Admission: EM | Admit: 2013-07-23 | Discharge: 2013-07-23 | Disposition: A | Discharge status: Home / Self Care | Payer: BC Managed Care – PPO | Attending: Emergency Medicine | Admitting: Emergency Medicine

## 2013-07-23 DIAGNOSIS — M79669 Pain in unspecified lower leg: Secondary | ICD-10-CM

## 2013-07-23 DIAGNOSIS — R609 Edema, unspecified: Secondary | ICD-10-CM | POA: Insufficient documentation

## 2013-07-23 DIAGNOSIS — Z85850 Personal history of malignant neoplasm of thyroid: Secondary | ICD-10-CM | POA: Insufficient documentation

## 2013-07-23 DIAGNOSIS — E079 Disorder of thyroid, unspecified: Secondary | ICD-10-CM | POA: Insufficient documentation

## 2013-07-23 DIAGNOSIS — M791 Myalgia, unspecified site: Secondary | ICD-10-CM

## 2013-07-23 DIAGNOSIS — I10 Essential (primary) hypertension: Secondary | ICD-10-CM | POA: Insufficient documentation

## 2013-07-23 DIAGNOSIS — K219 Gastro-esophageal reflux disease without esophagitis: Secondary | ICD-10-CM | POA: Insufficient documentation

## 2013-07-23 DIAGNOSIS — Z791 Long term (current) use of non-steroidal anti-inflammatories (NSAID): Secondary | ICD-10-CM | POA: Insufficient documentation

## 2013-07-23 DIAGNOSIS — Z85528 Personal history of other malignant neoplasm of kidney: Secondary | ICD-10-CM | POA: Insufficient documentation

## 2013-07-23 DIAGNOSIS — Z87448 Personal history of other diseases of urinary system: Secondary | ICD-10-CM | POA: Insufficient documentation

## 2013-07-23 DIAGNOSIS — Z87442 Personal history of urinary calculi: Secondary | ICD-10-CM | POA: Insufficient documentation

## 2013-07-23 DIAGNOSIS — M25569 Pain in unspecified knee: Secondary | ICD-10-CM | POA: Insufficient documentation

## 2013-07-23 DIAGNOSIS — Z905 Acquired absence of kidney: Secondary | ICD-10-CM | POA: Insufficient documentation

## 2013-07-23 DIAGNOSIS — R300 Dysuria: Secondary | ICD-10-CM | POA: Insufficient documentation

## 2013-07-23 DIAGNOSIS — Z792 Long term (current) use of antibiotics: Secondary | ICD-10-CM | POA: Insufficient documentation

## 2013-07-23 DIAGNOSIS — Z79899 Other long term (current) drug therapy: Secondary | ICD-10-CM | POA: Insufficient documentation

## 2013-07-23 LAB — BASIC METABOLIC PANEL
BUN: 13 mg/dL (ref 6–23)
CO2: 28 mEq/L (ref 19–32)
CREATININE: 0.6 mg/dL (ref 0.50–1.10)
Calcium: 9.4 mg/dL (ref 8.4–10.5)
Chloride: 100 mEq/L (ref 96–112)
GLUCOSE: 100 mg/dL — AB (ref 70–99)
POTASSIUM: 3.4 meq/L — AB (ref 3.7–5.3)
Sodium: 142 mEq/L (ref 137–147)

## 2013-07-23 LAB — CBC WITH DIFFERENTIAL/PLATELET
Basophils Absolute: 0.1 10*3/uL (ref 0.0–0.1)
Basophils Relative: 1 % (ref 0–1)
EOS ABS: 0.3 10*3/uL (ref 0.0–0.7)
Eosinophils Relative: 3 % (ref 0–5)
HCT: 37.5 % (ref 36.0–46.0)
HEMOGLOBIN: 12.6 g/dL (ref 12.0–15.0)
LYMPHS ABS: 2.9 10*3/uL (ref 0.7–4.0)
Lymphocytes Relative: 30 % (ref 12–46)
MCH: 28.3 pg (ref 26.0–34.0)
MCHC: 33.6 g/dL (ref 30.0–36.0)
MCV: 84.1 fL (ref 78.0–100.0)
MONOS PCT: 9 % (ref 3–12)
Monocytes Absolute: 0.9 10*3/uL (ref 0.1–1.0)
Neutro Abs: 5.6 10*3/uL (ref 1.7–7.7)
Neutrophils Relative %: 58 % (ref 43–77)
Platelets: 287 10*3/uL (ref 150–400)
RBC: 4.46 MIL/uL (ref 3.87–5.11)
RDW: 13.8 % (ref 11.5–15.5)
WBC: 9.7 10*3/uL (ref 4.0–10.5)

## 2013-07-23 LAB — D-DIMER, QUANTITATIVE: D-Dimer, Quant: <0.27 ug/mL-FEU (ref 0.00–0.48)

## 2013-07-23 MED ORDER — PHENAZOPYRIDINE HCL 200 MG PO TABS: 200.0000 mg | ORAL_TABLET | Freq: Three times a day (TID) | ORAL | Status: DC

## 2013-07-23 MED ORDER — MORPHINE SULFATE 10 MG/ML IJ SOLN
10.0000 mg | Freq: Once | INTRAMUSCULAR | Status: AC
  Administered 2013-07-23: 10 mg via INTRAMUSCULAR
  Filled 2013-07-23: qty 1

## 2013-07-23 MED ORDER — IBUPROFEN 800 MG PO TABS: 800.0000 mg | ORAL_TABLET | Freq: Three times a day (TID) | ORAL | Status: DC

## 2013-07-23 MED ORDER — PROMETHAZINE HCL 25 MG/ML IJ SOLN
25.0000 mg | Freq: Once | INTRAMUSCULAR | Status: AC
  Administered 2013-07-23: 25 mg via INTRAMUSCULAR
  Filled 2013-07-23: qty 1

## 2013-07-23 MED ORDER — OXYCODONE-ACETAMINOPHEN 10-325 MG PO TABS: 1.0000 | ORAL_TABLET | ORAL | Status: DC | PRN

## 2013-07-23 NOTE — ED Notes (Signed)
Pt c/o rt foot swelling onset today  Slight swelling noticed,  Denies inj,  Was seen sat for kidney stone w tx started

## 2013-07-23 NOTE — ED Notes (Signed)
Pt reports that she was dx with kidney stones on Saturday.  Has not passed stones yet.  States that she has had (R) calf swelling with pain that started today.  Pts PCP wants pt checked for DVT.

## 2013-07-23 NOTE — ED Provider Notes (Signed)
CSN: [id number]     Arrival date & time 07/23/13  1628 History  This chart was scribed for Tanna Furry, MD by Einar Pheasant, ED Scribe. This patient was seen in room MH07/MH07 and the patient's care was started at 7:22 PM.    Chief Complaint  Patient presents with  . Leg Swelling    The history is provided by the patient. No language interpreter was used.   HPI Comments: Yolanda Matthews is a 43 y.o. female who presents to the Emergency Department complaining of worsening right calf swelling and pain that started today. Pt states that she was diagnosed with kidney stones in the right kidney  4 days ago at [facility] Emergency Department, but she states that she has yet to pass a stone. She states that they did a CT scan on her and started her on Phenergan. Pt states that she has a history of Thyroid cancer and Renal Cancer. Pt states that she has had a lot of associated infection with the partial nephrectomy procedure. Denies any chest pain, SOB, or DVT. Pt denies smoking.   PCP: Lorenza Chick, MD  Past Medical History  Diagnosis Date  . Renal disorder   . Thyroid disease   . Hypertension   . Polycystic ovarian syndrome   . GERD (gastroesophageal reflux disease)   . Kidney stones   . History of kidney cancer   . Kidney stones   . Cancer     renal  . Thyroid cancer    Past Surgical History  Procedure Laterality Date  . Cesarean section    . Tonsillectomy    . Abdominal hysterectomy    . Partial nephrectomy     Family History  Problem Relation Age of Onset  . Hypertension Mother   . Diabetes Mother   . Hypertension Father   . Cancer Father    History  Substance Use Topics  . Smoking status: Never Smoker   . Smokeless tobacco: Never Used  . Alcohol Use: No   OB History   Grav Para Term Preterm Abortions TAB SAB Ect Mult Living                 Review of Systems  Constitutional: Negative for fever, chills, diaphoresis, appetite change and fatigue.  HENT: Negative for  mouth sores, sore throat and trouble swallowing.   Eyes: Negative for visual disturbance.  Respiratory: Negative for cough, chest tightness, shortness of breath and wheezing.   Cardiovascular: Negative for chest pain.  Gastrointestinal: Negative for nausea, vomiting, abdominal pain, diarrhea and abdominal distention.  Endocrine: Negative for polydipsia, polyphagia and polyuria.  Genitourinary: Negative for dysuria, frequency and hematuria.  Musculoskeletal: Positive for arthralgias (right calf) and joint swelling (right foot). Negative for gait problem.  Skin: Negative for color change, pallor and rash.  Neurological: Negative for dizziness, syncope, light-headedness and headaches.  Hematological: Does not bruise/bleed easily.  Psychiatric/Behavioral: Negative for behavioral problems and confusion.      Allergies  Demerol  Home Medications   Current Outpatient Rx  Name  Route  Sig  Dispense  Refill  . cholecalciferol (VITAMIN D) 1000 UNITS tablet   Oral   Take 1,000 Units by mouth daily.         . ciprofloxacin (CIPRO) 500 MG tablet   Oral   Take 1 tablet (500 mg total) by mouth 2 (two) times daily.   14 tablet   0   . co-enzyme Q-10 30 MG capsule   Oral  Take 30 mg by mouth daily.         . fish oil-omega-3 fatty acids 1000 MG capsule   Oral   Take 1 g by mouth 2 (two) times daily.         . hydrochlorothiazide (HYDRODIURIL) 25 MG tablet   Oral   Take 25 mg by mouth every morning.          Marland Kitchen ibuprofen (ADVIL,MOTRIN) 200 MG tablet   Oral   Take 600 mg by mouth every 8 (eight) hours as needed for pain.          Marland Kitchen ibuprofen (ADVIL,MOTRIN) 800 MG tablet   Oral   Take 1 tablet (800 mg total) by mouth 3 (three) times daily.   21 tablet   0   . levothyroxine (SYNTHROID, LEVOTHROID) 175 MCG tablet   Oral   Take 175 mcg by mouth daily before breakfast.         . metoprolol tartrate (LOPRESSOR) 25 MG tablet   Oral   Take 25 mg by mouth 2 (two) times  daily.         . Multiple Vitamin (MULITIVITAMIN WITH MINERALS) TABS   Oral   Take 1 tablet by mouth every morning.          Marland Kitchen omeprazole (PRILOSEC) 20 MG capsule   Oral   Take 20 mg by mouth 2 (two) times daily.          Marland Kitchen oxyCODONE-acetaminophen (PERCOCET) 10-325 MG per tablet   Oral   Take 1 tablet by mouth every 4 (four) hours as needed for pain.   30 tablet   0   . oxyCODONE-acetaminophen (PERCOCET) 5-325 MG per tablet   Oral   Take 2 tablets by mouth every 4 (four) hours as needed.   20 tablet   0   . phenazopyridine (PYRIDIUM) 200 MG tablet   Oral   Take 1 tablet (200 mg total) by mouth 3 (three) times daily.   10 tablet   0   . promethazine (PHENERGAN) 25 MG tablet   Oral   Take 1 tablet (25 mg total) by mouth every 6 (six) hours as needed for nausea.   20 tablet   0   . sertraline (ZOLOFT) 100 MG tablet   Oral   Take 100 mg by mouth every morning.         . tamsulosin (FLOMAX) 0.4 MG CAPS capsule   Oral   Take 1 capsule (0.4 mg total) by mouth daily.   30 capsule   0   . traMADol (ULTRAM) 50 MG tablet   Oral   Take 50 mg by mouth every 6 (six) hours as needed for moderate pain.           Triage vitals:BP 161/100  Pulse 105  Temp(Src) 99.3 F (37.4 C) (Oral)  Resp 18  Ht 5\' 10"  (1.778 m)  Wt 280 lb (127.007 kg)  BMI 40.18 kg/m2  SpO2 98%  Physical Exam  Constitutional: She is oriented to person, place, and time. She appears well-developed and well-nourished. No distress.  wrenching in pain.   HENT:  Head: Normocephalic.  Eyes: Conjunctivae are normal. Pupils are equal, round, and reactive to light. No scleral icterus.  Neck: Normal range of motion. Neck supple. No thyromegaly present.  Cardiovascular: Normal rate and regular rhythm.  Exam reveals no gallop and no friction rub.   No murmur heard. Pulmonary/Chest: Effort normal and breath sounds normal. No respiratory distress. She  has no wheezes. She has no rales.  Abdominal: Soft.  Bowel sounds are normal. She exhibits no distension. There is no tenderness. There is no rebound.  Musculoskeletal: Normal range of motion.  1+ edema to the dorsal aspect of the right foot.   Neurological: She is alert and oriented to person, place, and time.  Skin: Skin is warm and dry. No rash noted.  Psychiatric: She has a normal mood and affect. Her behavior is normal.    ED Course  Procedures (including critical care time)  DIAGNOSTIC STUDIES: Oxygen Saturation is 98% on RA, normal by my interpretation.    COORDINATION OF CARE: 7:28 PM- Will order a D-Dimer. Pt advised of plan for treatment and pt agrees. Medications  morphine injection 10 mg (10 mg Intramuscular Given 07/23/13 2027)  promethazine (PHENERGAN) injection 25 mg (25 mg Intramuscular Given 07/23/13 2025)   Labs Review Labs Reviewed  BASIC METABOLIC PANEL - Abnormal; Notable for the following:    Potassium 3.4 (*)    Glucose, Bld 100 (*)    All other components within normal limits  D-DIMER, QUANTITATIVE  CBC WITH DIFFERENTIAL   Imaging Review No results found.   EKG Interpretation None      MDM   Final diagnoses:  Calf pain  Muscular pain  Dysuria    Negative d-dimer. Don't feel she needs Lovenox for additional testing at this point. Had relief of her ureteral colic with pain meds. CT only shows renal punctate stones without hydronephrosis. This is a review of her prior CT. She has a good relationship with her urologist in [place] I have asked her to recheck with him about her persistent hematuria and pain patient is appropriate for discharge tonight.  I personally performed the services described in this documentation, which was scribed in my presence. The recorded information has been reviewed and is accurate. }   Tanna Furry, MD 07/23/13 2050

## 2013-07-23 NOTE — Discharge Instructions (Signed)
Heat and gentle stretching to calf. Follow up with your Urologist in [place].  Dysuria Dysuria is the medical term for pain with urination. There are many causes for dysuria, but urinary tract infection is the most common. If a urinalysis was performed it can show that there is a urinary tract infection. A urine culture confirms that you or your child is sick. You will need to follow up with a healthcare provider because:  If a urine culture was done you will need to know the culture results and treatment recommendations.  If the urine culture was positive, you or your child will need to be put on antibiotics or know if the antibiotics prescribed are the right antibiotics for your urinary tract infection.  If the urine culture is negative (no urinary tract infection), then other causes may need to be explored or antibiotics need to be stopped. Today laboratory work may have been done and there does not seem to be an infection. If cultures were done they will take at least 24 to 48 hours to be completed. Today x-rays may have been taken and they read as normal. No cause can be found for the problems. The x-rays may be re-read by a radiologist and you will be contacted if additional findings are made. You or your child may have been put on medications to help with this problem until you can see your primary caregiver. If the problems get better, see your primary caregiver if the problems return. If you were given antibiotics (medications which kill germs), take all of the mediations as directed for the full course of treatment.  If laboratory work was done, you need to find the results. Leave a telephone number where you can be reached. If this is not possible, make sure you find out how you are to get test results. HOME CARE INSTRUCTIONS   Drink lots of fluids. For adults, drink eight, 8 ounce glasses of clear juice or water a day. For children, replace fluids as suggested by your  caregiver.  Empty the bladder often. Avoid holding urine for long periods of time.  After a bowel movement, women should cleanse front to back, using each tissue only once.  Empty your bladder before and after sexual intercourse.  Take all the medicine given to you until it is gone. You may feel better in a few days, but TAKE ALL MEDICINE.  Avoid caffeine, tea, alcohol and carbonated beverages, because they tend to irritate the bladder.  In men, alcohol may irritate the prostate.  Only take over-the-counter or prescription medicines for pain, discomfort, or fever as directed by your caregiver.  If your caregiver has given you a follow-up appointment, it is very important to keep that appointment. Not keeping the appointment could result in a chronic or permanent injury, pain, and disability. If there is any problem keeping the appointment, you must call back to this facility for assistance. SEEK IMMEDIATE MEDICAL CARE IF:   Back pain develops.  A fever develops.  There is nausea (feeling sick to your stomach) or vomiting (throwing up).  Problems are no better with medications or are getting worse. MAKE SURE YOU:   Understand these instructions.  Will watch your condition.  Will get help right away if you are not doing well or get worse. Document Released: 02/04/2004 Document Revised: 07/31/2011 Document Reviewed: 12/12/2007 [facility] Patient Information 2014 [place].  Musculoskeletal Pain Musculoskeletal pain is muscle and boney aches and pains. These pains can occur in any part  of the body. Your caregiver may treat you without knowing the cause of the pain. They may treat you if blood or urine tests, X-rays, and other tests were normal.  CAUSES There is often not a definite cause or reason for these pains. These pains may be caused by a type of germ (virus). The discomfort may also come from overuse. Overuse includes working out too hard when your body is not fit. Boney  aches also come from weather changes. Bone is sensitive to atmospheric pressure changes. HOME CARE INSTRUCTIONS   Ask when your test results will be ready. Make sure you get your test results.  Only take over-the-counter or prescription medicines for pain, discomfort, or fever as directed by your caregiver. If you were given medications for your condition, do not drive, operate machinery or power tools, or sign legal documents for 24 hours. Do not drink alcohol. Do not take sleeping pills or other medications that may interfere with treatment.  Continue all activities unless the activities cause more pain. When the pain lessens, slowly resume normal activities. Gradually increase the intensity and duration of the activities or exercise.  During periods of severe pain, bed rest may be helpful. Lay or sit in any position that is comfortable.  Putting ice on the injured area.  Put ice in a bag.  Place a towel between your skin and the bag.  Leave the ice on for 15 to 20 minutes, 3 to 4 times a day.  Follow up with your caregiver for continued problems and no reason can be found for the pain. If the pain becomes worse or does not go away, it may be necessary to repeat tests or do additional testing. Your caregiver may need to look further for a possible cause. SEEK IMMEDIATE MEDICAL CARE IF:  You have pain that is getting worse and is not relieved by medications.  You develop chest pain that is associated with shortness or breath, sweating, feeling sick to your stomach (nauseous), or throw up (vomit).  Your pain becomes localized to the abdomen.  You develop any new symptoms that seem different or that concern you. MAKE SURE YOU:   Understand these instructions.  Will watch your condition.  Will get help right away if you are not doing well or get worse. Document Released: 05/08/2005 Document Revised: 07/31/2011 Document Reviewed: 01/10/2013 [facility] Patient Information 2014  [place].

## 2013-07-31 ENCOUNTER — Emergency Department (HOSPITAL_BASED_OUTPATIENT_CLINIC_OR_DEPARTMENT_OTHER)
Admission: EM | Admit: 2013-07-31 | Discharge: 2013-07-31 | Disposition: A | Discharge status: Home / Self Care | Payer: BC Managed Care – PPO | Attending: Emergency Medicine | Admitting: Emergency Medicine

## 2013-07-31 ENCOUNTER — Encounter (HOSPITAL_BASED_OUTPATIENT_CLINIC_OR_DEPARTMENT_OTHER): Payer: Self-pay | Admitting: Emergency Medicine

## 2013-07-31 DIAGNOSIS — N201 Calculus of ureter: Secondary | ICD-10-CM | POA: Insufficient documentation

## 2013-07-31 DIAGNOSIS — Z85528 Personal history of other malignant neoplasm of kidney: Secondary | ICD-10-CM | POA: Insufficient documentation

## 2013-07-31 DIAGNOSIS — R319 Hematuria, unspecified: Secondary | ICD-10-CM

## 2013-07-31 DIAGNOSIS — N23 Unspecified renal colic: Secondary | ICD-10-CM

## 2013-07-31 DIAGNOSIS — E079 Disorder of thyroid, unspecified: Secondary | ICD-10-CM | POA: Insufficient documentation

## 2013-07-31 DIAGNOSIS — I10 Essential (primary) hypertension: Secondary | ICD-10-CM | POA: Insufficient documentation

## 2013-07-31 DIAGNOSIS — Z79899 Other long term (current) drug therapy: Secondary | ICD-10-CM | POA: Insufficient documentation

## 2013-07-31 DIAGNOSIS — K219 Gastro-esophageal reflux disease without esophagitis: Secondary | ICD-10-CM | POA: Insufficient documentation

## 2013-07-31 DIAGNOSIS — Z792 Long term (current) use of antibiotics: Secondary | ICD-10-CM | POA: Insufficient documentation

## 2013-07-31 DIAGNOSIS — Z85850 Personal history of malignant neoplasm of thyroid: Secondary | ICD-10-CM | POA: Insufficient documentation

## 2013-07-31 LAB — URINE MICROSCOPIC-ADD ON

## 2013-07-31 LAB — BASIC METABOLIC PANEL
BUN: 14 mg/dL (ref 6–23)
CO2: 24 meq/L (ref 19–32)
Calcium: 10.1 mg/dL (ref 8.4–10.5)
Chloride: 98 mEq/L (ref 96–112)
Creatinine, Ser: 0.6 mg/dL (ref 0.50–1.10)
GFR calc Af Amer: >90 mL/min (ref >90)
GFR calc non Af Amer: >90 mL/min (ref >90)
GLUCOSE: 120 mg/dL — AB (ref 70–99)
POTASSIUM: 3.4 meq/L — AB (ref 3.7–5.3)
Sodium: 140 mEq/L (ref 137–147)

## 2013-07-31 LAB — URINALYSIS, ROUTINE W REFLEX MICROSCOPIC
Bilirubin Urine: NEGATIVE
Glucose, UA: NEGATIVE mg/dL
KETONES UR: NEGATIVE mg/dL
NITRITE: NEGATIVE
PH: 6.5 (ref 5.0–8.0)
Protein, ur: NEGATIVE mg/dL
SPECIFIC GRAVITY, URINE: 1.023 (ref 1.005–1.030)
Urobilinogen, UA: 0.2 mg/dL (ref 0.0–1.0)

## 2013-07-31 LAB — CBC
HCT: 40.8 % (ref 36.0–46.0)
HEMOGLOBIN: 13.7 g/dL (ref 12.0–15.0)
MCH: 28.4 pg (ref 26.0–34.0)
MCHC: 33.6 g/dL (ref 30.0–36.0)
MCV: 84.6 fL (ref 78.0–100.0)
PLATELETS: 342 10*3/uL (ref 150–400)
RBC: 4.82 MIL/uL (ref 3.87–5.11)
RDW: 13.9 % (ref 11.5–15.5)
WBC: 11.2 10*3/uL — ABNORMAL HIGH (ref 4.0–10.5)

## 2013-07-31 MED ORDER — HYDROMORPHONE HCL PF 1 MG/ML IJ SOLN
1.0000 mg | Freq: Once | INTRAMUSCULAR | Status: AC
  Administered 2013-07-31: 1 mg via INTRAVENOUS
  Filled 2013-07-31: qty 1

## 2013-07-31 MED ORDER — OXYCODONE-ACETAMINOPHEN 5-325 MG PO TABS: 1.0000 | ORAL_TABLET | Freq: Four times a day (QID) | ORAL | Status: DC | PRN

## 2013-07-31 MED ORDER — SODIUM CHLORIDE 0.9 % IV BOLUS (SEPSIS)
1000.0000 mL | Freq: Once | INTRAVENOUS | Status: AC
  Administered 2013-07-31: 1000 mL via INTRAVENOUS

## 2013-07-31 MED ORDER — ONDANSETRON HCL 4 MG/2ML IJ SOLN
4.0000 mg | Freq: Once | INTRAMUSCULAR | Status: AC
  Administered 2013-07-31: 4 mg via INTRAVENOUS
  Filled 2013-07-31: qty 2

## 2013-07-31 NOTE — ED Provider Notes (Signed)
CSN: [id number]     Arrival date & time 07/31/13  1618 History   First MD Initiated Contact with Patient 07/31/13 1644     Chief Complaint  Patient presents with  . Flank Pain     (Consider location/radiation/quality/duration/timing/severity/associated sxs/prior Treatment) HPI Pt presenting with c/o right flank pain.  She was seen several days ago and diagnosed with renal stones.  She states she has appointment made with urology in 3 days.  No fever.  Today had increased pain with vomiting associated.  No diarrhea.  Pain is in right lower abdomen.  No dysuria, no blood in urine. There are no other associated systemic symptoms, there are no other alleviating or modifying factors.   Past Medical History  Diagnosis Date  . Renal disorder   . Thyroid disease   . Hypertension   . Polycystic ovarian syndrome   . GERD (gastroesophageal reflux disease)   . Kidney stones   . History of kidney cancer   . Kidney stones   . Cancer     renal  . Thyroid cancer    Past Surgical History  Procedure Laterality Date  . Cesarean section    . Tonsillectomy    . Abdominal hysterectomy    . Partial nephrectomy     Family History  Problem Relation Age of Onset  . Hypertension Mother   . Diabetes Mother   . Hypertension Father   . Cancer Father    History  Substance Use Topics  . Smoking status: Never Smoker   . Smokeless tobacco: Never Used  . Alcohol Use: No   OB History   Grav Para Term Preterm Abortions TAB SAB Ect Mult Living                 Review of Systems ROS reviewed and all otherwise negative except for mentioned in HPI    Allergies  Demerol  Home Medications   Current Outpatient Rx  Name  Route  Sig  Dispense  Refill  . cholecalciferol (VITAMIN D) 1000 UNITS tablet   Oral   Take 1,000 Units by mouth daily.         . ciprofloxacin (CIPRO) 500 MG tablet   Oral   Take 1 tablet (500 mg total) by mouth 2 (two) times daily.   14 tablet   0   . co-enzyme Q-10  30 MG capsule   Oral   Take 30 mg by mouth daily.         . fish oil-omega-3 fatty acids 1000 MG capsule   Oral   Take 1 g by mouth 2 (two) times daily.         . hydrochlorothiazide (HYDRODIURIL) 25 MG tablet   Oral   Take 25 mg by mouth every morning.          Marland Kitchen ibuprofen (ADVIL,MOTRIN) 200 MG tablet   Oral   Take 600 mg by mouth every 8 (eight) hours as needed for pain.          Marland Kitchen ibuprofen (ADVIL,MOTRIN) 800 MG tablet   Oral   Take 1 tablet (800 mg total) by mouth 3 (three) times daily.   21 tablet   0   . levothyroxine (SYNTHROID, LEVOTHROID) 175 MCG tablet   Oral   Take 175 mcg by mouth daily before breakfast.         . metoprolol tartrate (LOPRESSOR) 25 MG tablet   Oral   Take 25 mg by mouth 2 (two) times daily.         Marland Kitchen  Multiple Vitamin (MULITIVITAMIN WITH MINERALS) TABS   Oral   Take 1 tablet by mouth every morning.          Marland Kitchen omeprazole (PRILOSEC) 20 MG capsule   Oral   Take 20 mg by mouth 2 (two) times daily.          Marland Kitchen oxyCODONE-acetaminophen (PERCOCET) 10-325 MG per tablet   Oral   Take 1 tablet by mouth every 4 (four) hours as needed for pain.   30 tablet   0   . oxyCODONE-acetaminophen (PERCOCET) 5-325 MG per tablet   Oral   Take 2 tablets by mouth every 4 (four) hours as needed.   20 tablet   0   . oxyCODONE-acetaminophen (PERCOCET/ROXICET) 5-325 MG per tablet   Oral   Take 1-2 tablets by mouth every 6 (six) hours as needed for severe pain.   15 tablet   0   . phenazopyridine (PYRIDIUM) 200 MG tablet   Oral   Take 1 tablet (200 mg total) by mouth 3 (three) times daily.   10 tablet   0   . promethazine (PHENERGAN) 25 MG tablet   Oral   Take 1 tablet (25 mg total) by mouth every 6 (six) hours as needed for nausea.   20 tablet   0   . sertraline (ZOLOFT) 100 MG tablet   Oral   Take 100 mg by mouth every morning.         . tamsulosin (FLOMAX) 0.4 MG CAPS capsule   Oral   Take 1 capsule (0.4 mg total) by mouth  daily.   30 capsule   0   . traMADol (ULTRAM) 50 MG tablet   Oral   Take 50 mg by mouth every 6 (six) hours as needed for moderate pain.           BP 164/100  Pulse 99  Temp(Src) 98.2 F (36.8 C) (Oral)  Resp 20  Ht 5\' 10"  (1.778 m)  Wt 275 lb (124.739 kg)  BMI 39.46 kg/m2  SpO2 95% Vitals reivewed Physical Exam Physical Examination: General appearance - alert, well appearing, and in no distress Mental status - alert, oriented to person, place, and time Eyes - no conjunctival injection, no scleral icterus Mouth - mucous membranes moist, pharynx normal without lesions Chest - clear to auscultation, no wheezes, rales or rhonchi, symmetric air entry Heart - normal rate, regular rhythm, normal S1, S2, no murmurs, rubs, clicks or gallops Abdomen - soft, nontender, nondistended, no masses or organomegaly Back exam - full range of motion, no tenderness, palpable spasm or pain on motion Extremities - peripheral pulses normal, no pedal edema, no clubbing or cyanosis Skin - normal coloration and turgor, no rashes ED Course  Procedures (including critical care time)  9:12 PM pt feeling improved, labs reviewed, her HR has improved, approx 80s on my manual pulse check.  She has appointment scheduled for urology followup in 3 days.  Labs Review Labs Reviewed  URINALYSIS, ROUTINE W REFLEX MICROSCOPIC - Abnormal; Notable for the following:    APPearance CLOUDY (*)    Hgb urine dipstick LARGE (*)    Leukocytes, UA MODERATE (*)    All other components within normal limits  URINE MICROSCOPIC-ADD ON - Abnormal; Notable for the following:    Bacteria, UA FEW (*)    All other components within normal limits  CBC - Abnormal; Notable for the following:    WBC 11.2 (*)    All other components within normal limits  BASIC METABOLIC PANEL - Abnormal; Notable for the following:    Potassium 3.4 (*)    Glucose, Bld 120 (*)    All other components within normal limits   Imaging Review No  results found.   EKG Interpretation None      MDM   Final diagnoses:  Ureteral colic  Hematuria    Pt presenting with c/o right flank and right lower abdominal pain.  Pt has been worked up with CT scan recently showing renal stones.  Urinalysis continues to show many RBCs, urine culture from prior visit reviewed and showed no predominant strain of bacteria.  Pt has urology followup scheduled in 3 days  Her pain is relieved after pain meds, vitals are improving.  Discharged with strict return precautions.  Pt agreeable with plan.    Threasa Beards, MD 08/04/13 1155

## 2013-07-31 NOTE — ED Notes (Signed)
Right flank pain. Hx of kidney stones.

## 2013-07-31 NOTE — ED Notes (Signed)
Rx x 1 for percocet given- pt's husband is picking her up

## 2013-07-31 NOTE — Discharge Instructions (Signed)
Return to the ED with any concerns including fever/chills, vomiting and not able to keep down liquids, pain not controlled by pain medications, decreased level of alertness/lethargy, or any other alarming symptoms °

## 2013-08-13 ENCOUNTER — Encounter (HOSPITAL_COMMUNITY): Payer: Self-pay | Admitting: Emergency Medicine

## 2013-08-13 ENCOUNTER — Emergency Department (HOSPITAL_COMMUNITY)
Admission: EM | Admit: 2013-08-13 | Discharge: 2013-08-13 | Disposition: A | Discharge status: Home / Self Care | Payer: BC Managed Care – PPO | Attending: Emergency Medicine | Admitting: Emergency Medicine

## 2013-08-13 ENCOUNTER — Emergency Department (HOSPITAL_COMMUNITY): Payer: BC Managed Care – PPO

## 2013-08-13 DIAGNOSIS — Z85528 Personal history of other malignant neoplasm of kidney: Secondary | ICD-10-CM | POA: Insufficient documentation

## 2013-08-13 DIAGNOSIS — K219 Gastro-esophageal reflux disease without esophagitis: Secondary | ICD-10-CM | POA: Insufficient documentation

## 2013-08-13 DIAGNOSIS — E079 Disorder of thyroid, unspecified: Secondary | ICD-10-CM | POA: Insufficient documentation

## 2013-08-13 DIAGNOSIS — Z3202 Encounter for pregnancy test, result negative: Secondary | ICD-10-CM | POA: Insufficient documentation

## 2013-08-13 DIAGNOSIS — N23 Unspecified renal colic: Secondary | ICD-10-CM

## 2013-08-13 DIAGNOSIS — N200 Calculus of kidney: Secondary | ICD-10-CM

## 2013-08-13 DIAGNOSIS — Z85850 Personal history of malignant neoplasm of thyroid: Secondary | ICD-10-CM | POA: Insufficient documentation

## 2013-08-13 DIAGNOSIS — I10 Essential (primary) hypertension: Secondary | ICD-10-CM | POA: Insufficient documentation

## 2013-08-13 DIAGNOSIS — Z79899 Other long term (current) drug therapy: Secondary | ICD-10-CM | POA: Insufficient documentation

## 2013-08-13 DIAGNOSIS — R6883 Chills (without fever): Secondary | ICD-10-CM | POA: Insufficient documentation

## 2013-08-13 DIAGNOSIS — R1031 Right lower quadrant pain: Secondary | ICD-10-CM

## 2013-08-13 LAB — BASIC METABOLIC PANEL
BUN: 10 mg/dL (ref 6–23)
CALCIUM: 10.4 mg/dL (ref 8.4–10.5)
CO2: 25 meq/L (ref 19–32)
Chloride: 99 mEq/L (ref 96–112)
Creatinine, Ser: 0.55 mg/dL (ref 0.50–1.10)
GFR calc Af Amer: >90 mL/min (ref >90)
GFR calc non Af Amer: >90 mL/min (ref >90)
GLUCOSE: 102 mg/dL — AB (ref 70–99)
Potassium: 3.9 mEq/L (ref 3.7–5.3)
Sodium: 139 mEq/L (ref 137–147)

## 2013-08-13 LAB — URINE MICROSCOPIC-ADD ON

## 2013-08-13 LAB — CBC
HCT: 41.7 % (ref 36.0–46.0)
HEMOGLOBIN: 14 g/dL (ref 12.0–15.0)
MCH: 28.1 pg (ref 26.0–34.0)
MCHC: 33.6 g/dL (ref 30.0–36.0)
MCV: 83.7 fL (ref 78.0–100.0)
Platelets: 414 10*3/uL — ABNORMAL HIGH (ref 150–400)
RBC: 4.98 MIL/uL (ref 3.87–5.11)
RDW: 13.9 % (ref 11.5–15.5)
WBC: 13.5 10*3/uL — ABNORMAL HIGH (ref 4.0–10.5)

## 2013-08-13 LAB — URINALYSIS, ROUTINE W REFLEX MICROSCOPIC
BILIRUBIN URINE: NEGATIVE
GLUCOSE, UA: NEGATIVE mg/dL
KETONES UR: NEGATIVE mg/dL
Nitrite: NEGATIVE
PH: 8 (ref 5.0–8.0)
Protein, ur: 30 mg/dL — AB
SPECIFIC GRAVITY, URINE: 1.021 (ref 1.005–1.030)
Urobilinogen, UA: 0.2 mg/dL (ref 0.0–1.0)

## 2013-08-13 LAB — PREGNANCY, URINE: Preg Test, Ur: NEGATIVE

## 2013-08-13 LAB — LIPASE, BLOOD: Lipase: 30 U/L (ref 11–59)

## 2013-08-13 MED ORDER — MORPHINE SULFATE 4 MG/ML IJ SOLN
4.0000 mg | Freq: Once | INTRAMUSCULAR | Status: AC
  Administered 2013-08-13: 4 mg via INTRAVENOUS
  Filled 2013-08-13: qty 1

## 2013-08-13 MED ORDER — HYDROMORPHONE HCL PF 1 MG/ML IJ SOLN
1.0000 mg | Freq: Once | INTRAMUSCULAR | Status: AC
  Administered 2013-08-13: 1 mg via INTRAVENOUS
  Filled 2013-08-13: qty 1

## 2013-08-13 MED ORDER — SODIUM CHLORIDE 0.9 % IV BOLUS (SEPSIS)
1000.0000 mL | Freq: Once | INTRAVENOUS | Status: AC
  Administered 2013-08-13: 1000 mL via INTRAVENOUS

## 2013-08-13 MED ORDER — SODIUM CHLORIDE 0.9 % IV BOLUS (SEPSIS)
500.0000 mL | Freq: Once | INTRAVENOUS | Status: AC
  Administered 2013-08-13: 500 mL via INTRAVENOUS

## 2013-08-13 MED ORDER — ONDANSETRON HCL 4 MG/2ML IJ SOLN
4.0000 mg | Freq: Once | INTRAMUSCULAR | Status: AC
  Administered 2013-08-13: 4 mg via INTRAVENOUS
  Filled 2013-08-13: qty 2

## 2013-08-13 MED ORDER — OXYCODONE-ACETAMINOPHEN 5-325 MG PO TABS: 1.0000 | ORAL_TABLET | Freq: Three times a day (TID) | ORAL | Status: DC | PRN

## 2013-08-13 MED ORDER — DEXTROSE 5 % IV SOLN
1.0000 g | Freq: Once | INTRAVENOUS | Status: AC
  Administered 2013-08-13: 1 g via INTRAVENOUS
  Filled 2013-08-13: qty 10

## 2013-08-13 MED ORDER — ONDANSETRON HCL 4 MG PO TABS: 4.0000 mg | ORAL_TABLET | Freq: Four times a day (QID) | ORAL | Status: DC

## 2013-08-13 MED ORDER — SULFAMETHOXAZOLE-TRIMETHOPRIM 800-160 MG PO TABS: 1.0000 | ORAL_TABLET | Freq: Two times a day (BID) | ORAL | Status: AC

## 2013-08-13 NOTE — Discharge Instructions (Signed)
Please call your doctor for a followup appointment within 24-48 hours. When you talk to your doctor please let them know that you were seen in the emergency department and have them acquire all of your records so that they can discuss the findings with you and formulate a treatment plan to fully care for your new and ongoing problems. Please call and set up an appointment with your primary care provider to be reassessed within next 24-48 hours Please keep appointment with urologist, Dr. Bess Harvest on 08/19/2013 for a cystoscopy to be performed Please take medications as prescribed While on pain medications his be absolutely no drinking alcohol, driving, operating any heavy machinery if there is extra please disposer proper manner. Please do not take any extra Tylenol for this can lead to Tylenol overdose and liver issues. Please continue monitor symptoms closely and if symptoms are to worsen or change (fever greater than 101, chills, nausea, vomiting, chest pain, shortness of breath, difficulty breathing, worsening or changes to abdominal pain, blood in the stools, increased blood in the urine, worsening symptoms) please report back to the ED immediately   Purine Restricted Diet A low-purine diet consists of foods that reduce uric acid made in your body. INDICATIONS FOR USE  Your caregiver may ask you to follow a low-purine diet to reduce gout flairs.  GUIDELINES  Avoid high-purine foods, including all alcohol, yeast extracts taken as supplements, and sauces made from meats (like gravy). Do not eat high-purine meats, including anchovies, sardines, herring, mussels, tuna, codfish, scallops, trout, haddock, bacon, organ meats, tripe, goose, wild game, and sweetbreads.  Grains  Allowed/Recommended: All, except those listed to consume in moderation.  Consume in Moderation: Oatmeal ( cup uncooked daily), wheat bran or germ ( cup daily), and whole grains. Vegetables  Allowed/Recommended: All, except those  listed to consume in moderation.  Consume in Moderation: Asparagus, cauliflower, spinach, mushrooms, and green peas ( cup daily). Fruit  Allowed/Recommended: All.  Consume in Moderation: None. Meat and Meat Substitutes  Allowed/Recommended: Eggs, nuts, and peanut butter.  Consume in Moderation: Limit to 4 to 6 oz daily. Avoid high-purine meats. Lentils, peas, and dried beans (1 cup daily). Milk  Allowed/Recommended: All. Choose low-fat or skim when possible.  Consume in Moderation: None. Fats and Oils  Allowed/Recommended: All.  Consume in Moderation: None. Beverages  Allowed/Recommended: All, except those listed to avoid.  Avoid: All alcohol. Condiments/Miscellaneous  Allowed/Recommended: All, except those listed to consume in moderation.  Consume in Moderation: Bouillon and meat-based broths and soups. Document Released: 09/02/2010 Document Revised: 07/31/2011 Document Reviewed: 09/02/2010 [facility] Patient Information 2014 [place]. Ureteral Colic (Kidney Stones) Ureteral colic is the result of a condition when kidney stones form inside the kidney. Once kidney stones are formed they may move into the tube that connects the kidney with the bladder (ureter). If this occurs, this condition may cause pain (colic) in the ureter.  CAUSES  Pain is caused by stone movement in the ureter and the obstruction caused by the stone. SYMPTOMS  The pain comes and goes as the ureter contracts around the stone. The pain is usually intense, sharp, and stabbing in character. The location of the pain may move as the stone moves through the ureter. When the stone is near the kidney the pain is usually located in the back and radiates to the belly (abdomen). When the stone is ready to pass into the bladder the pain is often located in the lower abdomen on the side the stone is located.  At this location, the symptoms may mimic those of a urinary tract infection with urinary frequency. Once  the stone is located here it often passes into the bladder and the pain disappears completely. TREATMENT   Your caregiver will provide you with medicine for pain relief.  You may require specialized follow-up X-rays.  The absence of pain does not always mean that the stone has passed. It may have just stopped moving. If the urine remains completely obstructed, it can cause loss of kidney function or even complete destruction of the involved kidney. It is your responsibility and in your interest that X-rays and follow-ups as suggested by your caregiver are completed. Relief of pain without passage of the stone can be associated with severe damage to the kidney, including loss of kidney function on that side.  If your stone does not pass on its own, additional measures may be taken by your caregiver to ensure its removal. HOME CARE INSTRUCTIONS   Increase your fluid intake. Water is the preferred fluid since juices containing vitamin C may acidify the urine making it less likely for certain stones (uric acid stones) to pass.  Strain all urine. A strainer will be provided. Keep all particulate matter or stones for your caregiver to inspect.  Take your pain medicine as directed.  Make a follow-up appointment with your caregiver as directed.  Remember that the goal is passage of your stone. The absence of pain does not mean the stone is gone. Follow your caregiver's instructions.  Only take over-the-counter or prescription medicines for pain, discomfort, or fever as directed by your caregiver. SEEK MEDICAL CARE IF:   Pain cannot be controlled with the prescribed medicine.  You have a fever.  Pain continues for longer than your caregiver advises it should.  There is a change in the pain, and you develop chest discomfort or constant abdominal pain.  You feel faint or pass out. MAKE SURE YOU:   Understand these instructions.  Will watch your condition.  Will get help right away if you  are not doing well or get worse. Document Released: 02/15/2005 Document Revised: 09/02/2012 Document Reviewed: 11/02/2010 [facility] Patient Information 2014 [place], [place].

## 2013-08-13 NOTE — ED Notes (Signed)
Pt c/o n/v, pelvic pain. Dr Lacinda Axon placed pt on Cipro, PCP placed pt on bactrim last Monday, finished that on Saturday. Pt states see bright red blood when she uses the restroom off and on. Pt had partial nephroectomy 2 years ago.

## 2013-08-13 NOTE — ED Provider Notes (Signed)
CSN: [id number]     Arrival date & time 08/13/13  1346 History   First MD Initiated Contact with Patient 08/13/13 1505     Chief Complaint  Patient presents with  . Emesis  . Pelvic Pain  . Hematuria     (Consider location/radiation/quality/duration/timing/severity/associated sxs/prior Treatment) The history is provided by the patient. No language interpreter was used.  Yolanda Matthews is a 43 y/o F with PMhx of renal disorder, renal carcinoma with partial nephrectomy in 2011, HTN, abdominal hysterectomy in 2009, thyroid cancer presenting to the ED with right sided flank and RLQ/right pelvic discomfort that has been ongoing since last night. Patient reported that most of the discomfort is localized to the right pelvic region described as a constant stabbing sensation and reported bladder discomfort that is mainly of a spasm description that is constant as well. Patient reported that the pain intermittently affects the right flank region. Patient reported that she has been taking Ibuprofen, Tylenol, and Percocet with minimal relief. Patient reported that she was seen and assessed in the ED setting recently and was discharged with Cipro. Reported that she was seen and assessed by Dr. Tresa Moore this past Monday where he discontinued the Cipro and started the patient on Bactrim. Patient reported that she has completed the course of Bactrim. Stated that while at [facility] a CT scan of the abdomen and pelvis with contrast were performed where the urologist believed that the blood in her urine is mainly from a leaking vessel. Reported that she has been feeling nauseous with emesis all day yesterday - stated that she has been unable to keep any foods or fluids down since 4:00PM last night. Stated that she has been taking pyridium since yesterday. Denied fever, chest pain, shortness of breath, difficulty breathing, neck pain, back pain, pyuria, dysuria.  Reported that she has an appointment with Dr. Tresa Moore  08/19/2013 PCP Dr. Madelyn Flavors U[facility] Dr. Tresa Moore   Past Medical History  Diagnosis Date  . Renal disorder   . Thyroid disease   . Hypertension   . Polycystic ovarian syndrome   . GERD (gastroesophageal reflux disease)   . Kidney stones   . History of kidney cancer   . Kidney stones   . Cancer     renal  . Thyroid cancer    Past Surgical History  Procedure Laterality Date  . Cesarean section    . Tonsillectomy    . Abdominal hysterectomy    . Partial nephrectomy     Family History  Problem Relation Age of Onset  . Hypertension Mother   . Diabetes Mother   . Hypertension Father   . Cancer Father    History  Substance Use Topics  . Smoking status: Never Smoker   . Smokeless tobacco: Never Used  . Alcohol Use: No   OB History   Grav Para Term Preterm Abortions TAB SAB Ect Mult Living                 Review of Systems  Constitutional: Positive for chills. Negative for fever.  Respiratory: Negative for chest tightness and shortness of breath.   Cardiovascular: Negative for chest pain.  Gastrointestinal: Positive for nausea, vomiting and abdominal pain (right sided abdominal pain, pelvic). Negative for diarrhea, constipation and blood in stool.  Genitourinary: Positive for dysuria, hematuria, flank pain (right flank ) and pelvic pain (right sided).  Musculoskeletal: Negative for back pain and neck pain.  Neurological: Negative for dizziness and weakness.  All other systems reviewed  and are negative.      Allergies  Demerol  Home Medications   Current Outpatient Rx  Name  Route  Sig  Dispense  Refill  . acetaminophen (TYLENOL) 500 MG tablet   Oral   Take 1,000 mg by mouth every 6 (six) hours as needed for mild pain.         . cholecalciferol (VITAMIN D) 1000 UNITS tablet   Oral   Take 1,000 Units by mouth daily.         Marland Kitchen co-enzyme Q-10 30 MG capsule   Oral   Take 30 mg by mouth daily.         . fish oil-omega-3 fatty acids 1000 MG capsule   Oral    Take 1 g by mouth 2 (two) times daily.         . hydrochlorothiazide (HYDRODIURIL) 25 MG tablet   Oral   Take 25 mg by mouth every morning.          Marland Kitchen ibuprofen (ADVIL,MOTRIN) 200 MG tablet   Oral   Take 600 mg by mouth every 8 (eight) hours as needed for pain.          Marland Kitchen levothyroxine (SYNTHROID, LEVOTHROID) 175 MCG tablet   Oral   Take 175 mcg by mouth daily before breakfast.         . metoprolol tartrate (LOPRESSOR) 25 MG tablet   Oral   Take 25 mg by mouth 2 (two) times daily.         . Multiple Vitamin (MULITIVITAMIN WITH MINERALS) TABS   Oral   Take 1 tablet by mouth every morning.          Marland Kitchen omeprazole (PRILOSEC) 20 MG capsule   Oral   Take 20 mg by mouth 2 (two) times daily.          Marland Kitchen oxyCODONE-acetaminophen (PERCOCET) 10-325 MG per tablet   Oral   Take 1 tablet by mouth every 4 (four) hours as needed for pain.   30 tablet   0   . phenazopyridine (PYRIDIUM) 200 MG tablet   Oral   Take 1 tablet (200 mg total) by mouth 3 (three) times daily.   10 tablet   0   . promethazine (PHENERGAN) 25 MG tablet   Oral   Take 1 tablet (25 mg total) by mouth every 6 (six) hours as needed for nausea.   20 tablet   0   . sertraline (ZOLOFT) 100 MG tablet   Oral   Take 100 mg by mouth every morning.         . sulfamethoxazole-trimethoprim (BACTRIM DS) 800-160 MG per tablet   Oral   Take 1 tablet by mouth 2 (two) times daily.         . tamsulosin (FLOMAX) 0.4 MG CAPS capsule   Oral   Take 1 capsule (0.4 mg total) by mouth daily.   30 capsule   0   . ondansetron (ZOFRAN) 4 MG tablet   Oral   Take 1 tablet (4 mg total) by mouth every 6 (six) hours.   12 tablet   0   . oxyCODONE-acetaminophen (PERCOCET/ROXICET) 5-325 MG per tablet   Oral   Take 1 tablet by mouth every 8 (eight) hours as needed for severe pain.   11 tablet   0   . sulfamethoxazole-trimethoprim (BACTRIM DS,SEPTRA DS) 800-160 MG per tablet   Oral   Take 1 tablet by mouth 2  (two) times daily.  10 tablet   0    BP 133/72  Pulse 98  Temp(Src) 98.6 F (37 C) (Oral)  Resp 18  SpO2 95% Physical Exam  Nursing note and vitals reviewed. Constitutional: She is oriented to person, place, and time. She appears well-developed and well-nourished. No distress.  HENT:  Head: Normocephalic and atraumatic.  Mouth/Throat: No oropharyngeal exudate.  Dry mucus membranes  Eyes: Conjunctivae and EOM are normal. Pupils are equal, round, and reactive to light. Right eye exhibits no discharge. Left eye exhibits no discharge.  Neck: Normal range of motion. Neck supple. No tracheal deviation present.  Negative neck stiffness Negative nuchal rigidity Negative cervical lymphadenopathy  Negative meningeal signs  Cardiovascular: Normal rate, regular rhythm and normal heart sounds.  Exam reveals no friction rub.   No murmur heard. Pulses:      Radial pulses are 2+ on the right side, and 2+ on the left side.  Pulmonary/Chest: Effort normal and breath sounds normal. No respiratory distress. She has no wheezes. She has no rales.  Abdominal: Soft. Bowel sounds are normal. There is tenderness. There is no guarding.  Obese  RLQ pain upon palpation/ right pelvic pain  Positive CVA tenderness to the right side  Musculoskeletal: Normal range of motion.  Full ROM to upper and lower extremities without difficulty noted, negative ataxia noted.  Lymphadenopathy:    She has no cervical adenopathy.  Neurological: She is alert and oriented to person, place, and time. No cranial nerve deficit. She exhibits normal muscle tone. Coordination normal.  Skin: Skin is warm and dry. No rash noted. She is not diaphoretic. No erythema.    ED Course  Procedures (including critical care time)  CLINICAL DATA: Right flank pain, dysuria, history of partial right  nephrectomy for renal cell carcinoma. Prior hysterectomy.  EXAM:  CT ABDOMEN AND PELVIS WITHOUT CONTRAST  TECHNIQUE:  Multidetector CT  imaging of the abdomen and pelvis was performed  following the standard protocol without intravenous contrast.  COMPARISON: 05/17/2013  FINDINGS:  Lung bases are clear.  Moderate to severe hepatic steatosis.  Unenhanced spleen, pancreas, and adrenal glands are within normal  limits.  Gallbladder is notable for layering sludge and/or noncalcified  gallstones. No intrahepatic or extrahepatic ductal dilatation.  Status post partial right lower pole nephrectomy. Kidneys are  otherwise unremarkable. At least two punctate nonobstructing left  upper pole renal calculi (coronal images 75 and 86) and one punctate  nonobstructing right lower pole renal calculus (coronal image 76).  No hydronephrosis.  No evidence of bowel obstruction. Normal appendix. Left colon is  decompressed.  No evidence of abdominal aortic aneurysm.  No abdominopelvic ascites.  No suspicious abdominopelvic lymphadenopathy.  Status post hysterectomy. 3.1 x 3.3 cm left ovarian/parovarian cyst  (series 2/image 71), stable versus mildly decreased. Right kidney is  unremarkable.  No ureteral or bladder calculi. Bladder is within normal limits.  Calcified pelvic phleboliths.  Mild degenerative changes of the lower thoracic spine.  IMPRESSION:  Bilateral punctate nonobstructing renal calculi. No ureteral or  bladder calculi. No hydronephrosis.  Status post partial right nephrectomy. No evidence of recurrent or  metastatic disease in the abdomen/pelvis.  Moderate to severe hepatic steatosis.  Electronically Signed  By: Julian Hy M.D.  On: 07/19/2013 19:10  4:56 PM This provider spoke with Dr. Alinda Money - [facility]. Discussed case and that patient reported that she was seen and assessed by Dr. Tresa Moore - was seen and assessed in 08/04/2013.  Return for cystoscopy. CT abdomen and pelvic with  contrast performed on 08/04/2013 in office - no filling defects, negative hydronephrosis. Nothing that suggests a urological cause  for pain. Dr. Alinda Money recommended pain to be controlled in the ED setting. Recommended that if pain controlled, patient can void, patient can be discharged home and continue to follow-up as outpatient with Dr. Tresa Moore with next appointment on 08/19/2013.    Results for orders placed during the hospital encounter of 08/13/13  CBC      Result Value Ref Range   WBC 13.5 (*) 4.0 - 10.5 K/uL   RBC 4.98  3.87 - 5.11 MIL/uL   Hemoglobin 14.0  12.0 - 15.0 g/dL   HCT 41.7  36.0 - 46.0 %   MCV 83.7  78.0 - 100.0 fL   MCH 28.1  26.0 - 34.0 pg   MCHC 33.6  30.0 - 36.0 g/dL   RDW 13.9  11.5 - 15.5 %   Platelets 414 (*) 150 - 400 K/uL  BASIC METABOLIC PANEL      Result Value Ref Range   Sodium 139  137 - 147 mEq/L   Potassium 3.9  3.7 - 5.3 mEq/L   Chloride 99  96 - 112 mEq/L   CO2 25  19 - 32 mEq/L   Glucose, Bld 102 (*) 70 - 99 mg/dL   BUN 10  6 - 23 mg/dL   Creatinine, Ser 0.55  0.50 - 1.10 mg/dL   Calcium 10.4  8.4 - 10.5 mg/dL   GFR calc non Af Amer >90  >90 mL/min   GFR calc Af Amer >90  >90 mL/min  LIPASE, BLOOD      Result Value Ref Range   Lipase 30  11 - 59 U/L  PREGNANCY, URINE      Result Value Ref Range   Preg Test, Ur NEGATIVE  NEGATIVE  URINALYSIS, ROUTINE W REFLEX MICROSCOPIC      Result Value Ref Range   Color, Urine RED (*) YELLOW   APPearance CLOUDY (*) CLEAR   Specific Gravity, Urine 1.021  1.005 - 1.030   pH 8.0  5.0 - 8.0   Glucose, UA NEGATIVE  NEGATIVE mg/dL   Hgb urine dipstick LARGE (*) NEGATIVE   Bilirubin Urine NEGATIVE  NEGATIVE   Ketones, ur NEGATIVE  NEGATIVE mg/dL   Protein, ur 30 (*) NEGATIVE mg/dL   Urobilinogen, UA 0.2  0.0 - 1.0 mg/dL   Nitrite NEGATIVE  NEGATIVE   Leukocytes, UA MODERATE (*) NEGATIVE  URINE MICROSCOPIC-ADD ON      Result Value Ref Range   Squamous Epithelial / LPF RARE  RARE   WBC, UA 7-10  <3 WBC/hpf   RBC / HPF TOO NUMEROUS TO COUNT  <3 RBC/hpf   Bacteria, UA RARE  RARE    Labs Review Labs Reviewed  CBC - Abnormal; Notable  for the following:    WBC 13.5 (*)    Platelets 414 (*)    All other components within normal limits  BASIC METABOLIC PANEL - Abnormal; Notable for the following:    Glucose, Bld 102 (*)    All other components within normal limits  URINALYSIS, ROUTINE W REFLEX MICROSCOPIC - Abnormal; Notable for the following:    Color, Urine RED (*)    APPearance CLOUDY (*)    Hgb urine dipstick LARGE (*)    Protein, ur 30 (*)    Leukocytes, UA MODERATE (*)    All other components within normal limits  LIPASE, BLOOD  PREGNANCY, URINE  URINE MICROSCOPIC-ADD ON  Imaging Review US Renal  08/13/2013   CLINICAL DATA:  PELVIC PAIN, HEMATURIA  EXAM: RENAL/URINARY TRACT ULTRASOUND COMPLETE  COMPARISON:  CT ABD-PELV W/ CM dated 08/04/2013  FINDINGS: Right Kidney:  Length: 11.4 cm. Echogenicity within normal limits. No mass or hydronephrosis visualized.  Left Kidney:  Length: 12.2 cm. Echogenicity within normal limits. No mass or hydronephrosis visualized.  Bladder:  Nondistended  IMPRESSION: Normal renal ultrasound.   Electronically Signed   By: Margaree Mackintosh M.D.   On: 08/13/2013 16:38     EKG Interpretation None      MDM   Final diagnoses:  Ureteral colic  Right lower quadrant pain  Nephrolithiasis   Medications  sodium chloride 0.9 % bolus 1,000 mL (0 mLs Intravenous Stopped 08/13/13 1645)  ondansetron (ZOFRAN) injection 4 mg (4 mg Intravenous Given 08/13/13 1548)  morphine 4 MG/ML injection 4 mg (4 mg Intravenous Given 08/13/13 1548)  HYDROmorphone (DILAUDID) injection 1 mg (1 mg Intravenous Given 08/13/13 1725)  sodium chloride 0.9 % bolus 500 mL (0 mLs Intravenous Stopped 08/13/13 1758)  ondansetron (ZOFRAN) injection 4 mg (4 mg Intravenous Given 08/13/13 1725)  cefTRIAXone (ROCEPHIN) 1 g in dextrose 5 % 50 mL IVPB (0 g Intravenous Stopped 08/13/13 1759)  HYDROmorphone (DILAUDID) injection 1 mg (1 mg Intravenous Given 08/13/13 1849)  ondansetron (ZOFRAN) injection 4 mg (4 mg Intravenous Given  08/13/13 1849)   Filed Vitals:   08/13/13 1615 08/13/13 1631 08/13/13 1715 08/13/13 1847  BP: 148/73 146/64 133/72   Pulse: 92 89 92 98  Temp:  98.6 F (37 C)    TempSrc:  Oral    Resp: 16 14  18   SpO2: 97% 97% 94% 95%    Patient presenting to the ED with right sided flank pain and right sided lower quadrant and pelvic pain presenting with history of kidney stones. Reported that she has been having hematuria, nausea, vomiting - unable to keep food or fluids down since yesterday. Reported that she was seen and assessed by Dr. Tresa Moore on Monday where a CT scan of the abdomen with contrast performed that he believes the blood in the urine to be due to a leaking vessel in the kidney and is recommending patient to seen by vascular. Stated that she has been using Iburpfen, Tylenol, and Percocet for the discomfort. Stated that Cipro was discontinued and started on Bactrim that she recently finished. This provider reviewed the patient's chart - patient has been seen multiple times regarding her kidney stones and renal colic. This provider reviewed the imaging and did not see anything regarding a CT abdomen and pelvis with contrast - only a CT without contrast noted from 07/19/2013.  Alert and oriented. GCS 15. Heart rate and rhythm normal. Lungs clear to auscultation. Patient in renal colic - rocking back and forth on the bed. Negative diaphoresis. Negative respiratory distress. Obese. BS normoactive in all quadrants with discomfort upon palpation to the RLQ/right pelvic with positive right sided CVA tenderness. Full ROM to upper and lower extremities. Equal grip strength.  CBC noted mild elevated white blood cell count of 13.5. BMP negative findings. Lipase negative elevation. Urinalysis noted large hemoglobin with moderate leukocytes with white blood cell count 7-10. Urine pregnancy negative. Renal ultrasound unremarkable-negative hydronephrosis identified. This provider spoke with Urology who reported that  patient was seen and assessed by Dr. Tresa Moore on 08/04/2013 where CT scan of the abdomen and pelvis with contrast was performed while in the office and reported punctate stones with negative findings of  hydronephrosis. As per urologist, recommended pain control and for patient to be discharged - patient has close follow up with Dr. Tresa Moore on 08/19/2013 for cystoscopy to be performed.  Negative findings of pyelonephritis. Doubt appendicitis. Doubt pelvic issue. Suspicion to be ongoing flank pain secondary to chronic kidney stones. Patient has close followup with urology this week. Patient presenting with UTI with white blood cell count of 7-10 with moderate leukocytes. Gross hematuria again identified. Patient is being followed up regarding this gross hematuria cystoscopy scheduled for 08/19/2013. Pain controlled in ED setting. Patient stable, afebrile. Patient tolerated fluids well. Negative episodes of emesis while in ED setting. Discharged patient. Discharged patient with antibiotics, Zofran, pain medications-discussed course, cautions, disposal technique. Referred patient to primary care provider and urologist. Discussed with patient to rest and stay hydrated. Discussed with patient to closely monitor symptoms and if symptoms are to worsen or change to report back to the ED - strict return instructions given.  Patient agreed to plan of care, understood, all questions answered.   Jamse Mead, PA-C 08/14/13 1411  Jamse Mead, PA-C 08/14/13 1941

## 2013-08-13 NOTE — ED Notes (Signed)
Pt alert and oriented x4. Respirations even and unlabored, bilateral symmetrical rise and fall of chest. Skin warm and dry. In no acute distress. Denies needs.   

## 2013-08-17 NOTE — ED Provider Notes (Signed)
Medical screening examination/treatment/procedure(s) were performed by non-physician practitioner and as supervising physician I was immediately available for consultation/collaboration.   EKG Interpretation None        Houston Siren III, MD 08/17/13 1256

## 2013-08-25 ENCOUNTER — Emergency Department (HOSPITAL_BASED_OUTPATIENT_CLINIC_OR_DEPARTMENT_OTHER)
Admission: EM | Admit: 2013-08-25 | Discharge: 2013-08-25 | Disposition: A | Discharge status: Home / Self Care | Payer: BC Managed Care – PPO | Attending: Emergency Medicine | Admitting: Emergency Medicine

## 2013-08-25 ENCOUNTER — Encounter (HOSPITAL_BASED_OUTPATIENT_CLINIC_OR_DEPARTMENT_OTHER): Payer: Self-pay | Admitting: Emergency Medicine

## 2013-08-25 DIAGNOSIS — E079 Disorder of thyroid, unspecified: Secondary | ICD-10-CM | POA: Insufficient documentation

## 2013-08-25 DIAGNOSIS — N390 Urinary tract infection, site not specified: Secondary | ICD-10-CM

## 2013-08-25 DIAGNOSIS — I10 Essential (primary) hypertension: Secondary | ICD-10-CM | POA: Insufficient documentation

## 2013-08-25 DIAGNOSIS — Z85528 Personal history of other malignant neoplasm of kidney: Secondary | ICD-10-CM | POA: Insufficient documentation

## 2013-08-25 DIAGNOSIS — Z792 Long term (current) use of antibiotics: Secondary | ICD-10-CM | POA: Insufficient documentation

## 2013-08-25 DIAGNOSIS — R1110 Vomiting, unspecified: Secondary | ICD-10-CM

## 2013-08-25 DIAGNOSIS — Z8742 Personal history of other diseases of the female genital tract: Secondary | ICD-10-CM | POA: Insufficient documentation

## 2013-08-25 DIAGNOSIS — Z87442 Personal history of urinary calculi: Secondary | ICD-10-CM

## 2013-08-25 DIAGNOSIS — Z79899 Other long term (current) drug therapy: Secondary | ICD-10-CM | POA: Insufficient documentation

## 2013-08-25 DIAGNOSIS — Z85850 Personal history of malignant neoplasm of thyroid: Secondary | ICD-10-CM | POA: Insufficient documentation

## 2013-08-25 DIAGNOSIS — K219 Gastro-esophageal reflux disease without esophagitis: Secondary | ICD-10-CM | POA: Insufficient documentation

## 2013-08-25 LAB — URINALYSIS, ROUTINE W REFLEX MICROSCOPIC
Bilirubin Urine: NEGATIVE
Glucose, UA: NEGATIVE mg/dL
Ketones, ur: NEGATIVE mg/dL
NITRITE: NEGATIVE
Protein, ur: NEGATIVE mg/dL
SPECIFIC GRAVITY, URINE: 1.022 (ref 1.005–1.030)
UROBILINOGEN UA: 0.2 mg/dL (ref 0.0–1.0)
pH: 7 (ref 5.0–8.0)

## 2013-08-25 LAB — URINE MICROSCOPIC-ADD ON

## 2013-08-25 MED ORDER — ONDANSETRON HCL 8 MG PO TABS: 8.0000 mg | ORAL_TABLET | ORAL | Status: DC | PRN

## 2013-08-25 MED ORDER — PROMETHAZINE HCL 25 MG/ML IJ SOLN
25.0000 mg | Freq: Once | INTRAMUSCULAR | Status: AC
  Administered 2013-08-25: 25 mg via INTRAMUSCULAR
  Filled 2013-08-25: qty 1

## 2013-08-25 MED ORDER — CIPROFLOXACIN HCL 500 MG PO TABS: 500.0000 mg | ORAL_TABLET | Freq: Two times a day (BID) | ORAL | Status: DC

## 2013-08-25 NOTE — ED Notes (Signed)
Pt states she has been passing kidney stone x "few weeks"-c/o increase in blood in urine with n/v

## 2013-08-25 NOTE — Discharge Instructions (Signed)
Cipro as prescribed.  Zofran as prescribed as needed for nausea.  Continue your Percocet as needed for pain.  Followup with your urologist on Thursday as scheduled. If having continued problems before then, call to expedite his followup appointment.   Urinary Tract Infection Urinary tract infections (UTIs) can develop anywhere along your urinary tract. Your urinary tract is your body's drainage system for removing wastes and extra water. Your urinary tract includes two kidneys, two ureters, a bladder, and a urethra. Your kidneys are a pair of bean-shaped organs. Each kidney is about the size of your fist. They are located below your ribs, one on each side of your spine. CAUSES Infections are caused by microbes, which are microscopic organisms, including fungi, viruses, and bacteria. These organisms are so small that they can only be seen through a microscope. Bacteria are the microbes that most commonly cause UTIs. SYMPTOMS  Symptoms of UTIs may vary by age and gender of the patient and by the location of the infection. Symptoms in young women typically include a frequent and intense urge to urinate and a painful, burning feeling in the bladder or urethra during urination. Older women and men are more likely to be tired, shaky, and weak and have muscle aches and abdominal pain. A fever may mean the infection is in your kidneys. Other symptoms of a kidney infection include pain in your back or sides below the ribs, nausea, and vomiting. DIAGNOSIS To diagnose a UTI, your caregiver will ask you about your symptoms. Your caregiver also will ask to provide a urine sample. The urine sample will be tested for bacteria and white blood cells. White blood cells are made by your body to help fight infection. TREATMENT  Typically, UTIs can be treated with medication. Because most UTIs are caused by a bacterial infection, they usually can be treated with the use of antibiotics. The choice of antibiotic and  length of treatment depend on your symptoms and the type of bacteria causing your infection. HOME CARE INSTRUCTIONS  If you were prescribed antibiotics, take them exactly as your caregiver instructs you. Finish the medication even if you feel better after you have only taken some of the medication.  Drink enough water and fluids to keep your urine clear or pale yellow.  Avoid caffeine, tea, and carbonated beverages. They tend to irritate your bladder.  Empty your bladder often. Avoid holding urine for long periods of time.  Empty your bladder before and after sexual intercourse.  After a bowel movement, women should cleanse from front to back. Use each tissue only once. SEEK MEDICAL CARE IF:   You have back pain.  You develop a fever.  Your symptoms do not begin to resolve within 3 days. SEEK IMMEDIATE MEDICAL CARE IF:   You have severe back pain or lower abdominal pain.  You develop chills.  You have nausea or vomiting.  You have continued burning or discomfort with urination. MAKE SURE YOU:   Understand these instructions.  Will watch your condition.  Will get help right away if you are not doing well or get worse. Document Released: 02/15/2005 Document Revised: 11/07/2011 Document Reviewed: 06/16/2011 [facility] Patient Information 2014 [place].

## 2013-08-25 NOTE — ED Provider Notes (Signed)
CSN: [id number]     Arrival date & time 08/25/13  2009 History  This chart was scribed for Veryl Speak, MD by Celesta Gentile, ED Scribe. The patient was seen in room MH11/MH11. Patient's care was started at 9:07 PM.  Chief Complaint  Patient presents with  . Nephrolithiasis     The history is provided by the patient. No language interpreter was used.   HPI Comments: Yolanda Matthews is a 43 y.o. female who presents to the Emergency Department complaining of increasing hematuria with associated right sided flank pain and lower abdominal pain that started about 6 weeks ago.  Pt states she was on Cipro and then Bactrim without relief.  Pt complains of nausea and episodes of emesis.  She states she took Zofran for her nausea and emesis, but states it didn't provide relief.  Pt denies dysuria, but states she feels she isn't fully emptying her bladder.  Pt had a partial nephrectomy for kidney cancer.  She states her urologist thinks she might be passing clots.  Dr. Tammi Klippel is her urologist and the last time she visited their office was 2 weeks ago.  She reports she has an appointment with Dr. Tammi Klippel on Thursday.    Past Medical History  Diagnosis Date  . Renal disorder   . Thyroid disease   . Hypertension   . Polycystic ovarian syndrome   . GERD (gastroesophageal reflux disease)   . Kidney stones   . History of kidney cancer   . Kidney stones   . Cancer     renal  . Thyroid cancer    Past Surgical History  Procedure Laterality Date  . Cesarean section    . Tonsillectomy    . Abdominal hysterectomy    . Partial nephrectomy     Family History  Problem Relation Age of Onset  . Hypertension Mother   . Diabetes Mother   . Hypertension Father   . Cancer Father    History  Substance Use Topics  . Smoking status: Never Smoker   . Smokeless tobacco: Never Used  . Alcohol Use: No   OB History   Grav Para Term Preterm Abortions TAB SAB Ect Mult Living                 Review of  Systems  A complete 10 system review of systems was obtained and all systems are negative except as noted in the HPI and PMH.   Allergies  Demerol  Home Medications   Current Outpatient Rx  Name  Route  Sig  Dispense  Refill  . aminocaproic acid (AMICAR) 500 MG tablet   Oral   Take 1,000 mg by mouth every 6 (six) hours. For 14 days. Started 08/20/13         . acetaminophen (TYLENOL) 500 MG tablet   Oral   Take 1,000 mg by mouth every 6 (six) hours as needed for mild pain.         . cholecalciferol (VITAMIN D) 1000 UNITS tablet   Oral   Take 1,000 Units by mouth daily.         Marland Kitchen co-enzyme Q-10 30 MG capsule   Oral   Take 30 mg by mouth daily.         . fish oil-omega-3 fatty acids 1000 MG capsule   Oral   Take 1 g by mouth 2 (two) times daily.         . hydrochlorothiazide (HYDRODIURIL) 25 MG tablet   Oral  Take 25 mg by mouth every morning.          Marland Kitchen ibuprofen (ADVIL,MOTRIN) 200 MG tablet   Oral   Take 600 mg by mouth every 8 (eight) hours as needed for pain.          Marland Kitchen levothyroxine (SYNTHROID, LEVOTHROID) 175 MCG tablet   Oral   Take 175 mcg by mouth daily before breakfast.         . metoprolol tartrate (LOPRESSOR) 25 MG tablet   Oral   Take 25 mg by mouth 2 (two) times daily.         . Multiple Vitamin (MULITIVITAMIN WITH MINERALS) TABS   Oral   Take 1 tablet by mouth every morning.          Marland Kitchen omeprazole (PRILOSEC) 20 MG capsule   Oral   Take 20 mg by mouth 2 (two) times daily.          . ondansetron (ZOFRAN) 4 MG tablet   Oral   Take 1 tablet (4 mg total) by mouth every 6 (six) hours.   12 tablet   0   . oxyCODONE-acetaminophen (PERCOCET) 10-325 MG per tablet   Oral   Take 1 tablet by mouth every 4 (four) hours as needed for pain.   30 tablet   0   . oxyCODONE-acetaminophen (PERCOCET/ROXICET) 5-325 MG per tablet   Oral   Take 1 tablet by mouth every 8 (eight) hours as needed for severe pain.   11 tablet   0   .  phenazopyridine (PYRIDIUM) 200 MG tablet   Oral   Take 1 tablet (200 mg total) by mouth 3 (three) times daily.   10 tablet   0   . promethazine (PHENERGAN) 25 MG tablet   Oral   Take 1 tablet (25 mg total) by mouth every 6 (six) hours as needed for nausea.   20 tablet   0   . sertraline (ZOLOFT) 100 MG tablet   Oral   Take 100 mg by mouth every morning.         . sulfamethoxazole-trimethoprim (BACTRIM DS) 800-160 MG per tablet   Oral   Take 1 tablet by mouth 2 (two) times daily.         . tamsulosin (FLOMAX) 0.4 MG CAPS capsule   Oral   Take 1 capsule (0.4 mg total) by mouth daily.   30 capsule   0    Triage Vitals: BP 154/99  Pulse 102  Temp(Src) 99.1 F (37.3 C) (Oral)  Resp 20  Ht 5\' 11"  (1.803 m)  Wt 280 lb (127.007 kg)  BMI 39.07 kg/m2  SpO2 98%  Physical Exam  Nursing note and vitals reviewed. Constitutional: She is oriented to person, place, and time. She appears well-developed and well-nourished. No distress.  HENT:  Head: Normocephalic and atraumatic.  Eyes: Conjunctivae and EOM are normal. Right eye exhibits no discharge. Left eye exhibits no discharge.  Neck: Neck supple. No tracheal deviation present.  Cardiovascular: Normal rate, regular rhythm and normal heart sounds.  Exam reveals no gallop and no friction rub.   No murmur heard. Pulmonary/Chest: Effort normal and breath sounds normal. No respiratory distress. She has no wheezes. She has no rales.  Abdominal: Soft. She exhibits no distension and no mass. There is no rebound and no guarding.  TTP of the suprapubic region.   Musculoskeletal: Normal range of motion.  Neurological: She is alert and oriented to person, place, and time.  Skin: Skin  is warm and dry. No rash noted.  Psychiatric: She has a normal mood and affect. Her behavior is normal.    ED Course  Procedures (including critical care time) DIAGNOSTIC STUDIES: Oxygen Saturation is 98% on RA, normal by my interpretation.     COORDINATION OF CARE: 9:17 PM-Will order Zofran and discharge with Ciprofloxacin.  Patient informed of current plan of treatment and evaluation and agrees with plan.    Results for orders placed during the hospital encounter of 08/25/13  URINALYSIS, ROUTINE W REFLEX MICROSCOPIC      Result Value Ref Range   Color, Urine RED (*) YELLOW   APPearance CLOUDY (*) CLEAR   Specific Gravity, Urine 1.022  1.005 - 1.030   pH 7.0  5.0 - 8.0   Glucose, UA NEGATIVE  NEGATIVE mg/dL   Hgb urine dipstick LARGE (*) NEGATIVE   Bilirubin Urine NEGATIVE  NEGATIVE   Ketones, ur NEGATIVE  NEGATIVE mg/dL   Protein, ur NEGATIVE  NEGATIVE mg/dL   Urobilinogen, UA 0.2  0.0 - 1.0 mg/dL   Nitrite NEGATIVE  NEGATIVE   Leukocytes, UA MODERATE (*) NEGATIVE  URINE MICROSCOPIC-ADD ON      Result Value Ref Range   Squamous Epithelial / LPF RARE  RARE   WBC, UA 7-10  <3 WBC/hpf   RBC / HPF TOO NUMEROUS TO COUNT  <3 RBC/hpf   Bacteria, UA RARE  RARE   Urine-Other MUCOUS PRESENT       MDM   Final diagnoses:  None    Patient presents with suprapubic discomfort and believes she has a urinary tract infection. She has a long history of pain related to kidney stones. She has a urologist who follows her for this. She presents today with a recurrence of this discomfort and states that she has been unable to keep anything down do to vomiting. She appears well-hydrated and vitals are unremarkable. Urinalysis reveals red cells and moderate leukocytes. This will be treated with Keflex and followup with her urologist. According to the controlled substance database, the patient has filled to prescriptions for a total of 50 Percocet in the past week. I've advised her to take these for her pain. She will be given additional Zofran ODT to help with the nausea that she is complaining of.   I personally performed the services described in this documentation, which was scribed in my presence. The recorded information has been  reviewed and is accurate.      Veryl Speak, MD 08/25/13 [phone]

## 2013-10-15 ENCOUNTER — Emergency Department (HOSPITAL_COMMUNITY)
Admission: EM | Admit: 2013-10-15 | Discharge: 2013-10-15 | Discharge status: Against Medical Advice | Payer: BC Managed Care – PPO

## 2013-11-28 ENCOUNTER — Encounter (HOSPITAL_COMMUNITY): Payer: Self-pay | Admitting: Emergency Medicine

## 2013-11-28 ENCOUNTER — Emergency Department (HOSPITAL_COMMUNITY)
Admission: EM | Admit: 2013-11-28 | Discharge: 2013-11-28 | Disposition: A | Discharge status: Home / Self Care | Payer: BC Managed Care – PPO | Attending: Emergency Medicine | Admitting: Emergency Medicine

## 2013-11-28 DIAGNOSIS — R319 Hematuria, unspecified: Secondary | ICD-10-CM | POA: Insufficient documentation

## 2013-11-28 DIAGNOSIS — N23 Unspecified renal colic: Secondary | ICD-10-CM

## 2013-11-28 DIAGNOSIS — K219 Gastro-esophageal reflux disease without esophagitis: Secondary | ICD-10-CM | POA: Insufficient documentation

## 2013-11-28 DIAGNOSIS — Z85528 Personal history of other malignant neoplasm of kidney: Secondary | ICD-10-CM | POA: Insufficient documentation

## 2013-11-28 DIAGNOSIS — R110 Nausea: Secondary | ICD-10-CM | POA: Insufficient documentation

## 2013-11-28 DIAGNOSIS — R740 Nonspecific elevation of levels of transaminase and lactic acid dehydrogenase [LDH]: Secondary | ICD-10-CM

## 2013-11-28 DIAGNOSIS — R7402 Elevation of levels of lactic acid dehydrogenase (LDH): Secondary | ICD-10-CM | POA: Insufficient documentation

## 2013-11-28 DIAGNOSIS — Z87448 Personal history of other diseases of urinary system: Secondary | ICD-10-CM | POA: Insufficient documentation

## 2013-11-28 DIAGNOSIS — I10 Essential (primary) hypertension: Secondary | ICD-10-CM | POA: Insufficient documentation

## 2013-11-28 DIAGNOSIS — R7989 Other specified abnormal findings of blood chemistry: Secondary | ICD-10-CM

## 2013-11-28 DIAGNOSIS — Z79899 Other long term (current) drug therapy: Secondary | ICD-10-CM | POA: Insufficient documentation

## 2013-11-28 DIAGNOSIS — R7401 Elevation of levels of liver transaminase levels: Secondary | ICD-10-CM | POA: Insufficient documentation

## 2013-11-28 DIAGNOSIS — E079 Disorder of thyroid, unspecified: Secondary | ICD-10-CM | POA: Insufficient documentation

## 2013-11-28 DIAGNOSIS — R945 Abnormal results of liver function studies: Secondary | ICD-10-CM

## 2013-11-28 DIAGNOSIS — Z8742 Personal history of other diseases of the female genital tract: Secondary | ICD-10-CM | POA: Insufficient documentation

## 2013-11-28 DIAGNOSIS — Z85850 Personal history of malignant neoplasm of thyroid: Secondary | ICD-10-CM | POA: Insufficient documentation

## 2013-11-28 LAB — COMPREHENSIVE METABOLIC PANEL
ALT: 139 U/L — ABNORMAL HIGH (ref 0–35)
AST: 122 U/L — AB (ref 0–37)
Albumin: 4.2 g/dL (ref 3.5–5.2)
Alkaline Phosphatase: 116 U/L (ref 39–117)
Anion gap: 16 — ABNORMAL HIGH (ref 5–15)
BILIRUBIN TOTAL: 0.2 mg/dL — AB (ref 0.3–1.2)
BUN: 16 mg/dL (ref 6–23)
CALCIUM: 9.8 mg/dL (ref 8.4–10.5)
CHLORIDE: 99 meq/L (ref 96–112)
CO2: 23 meq/L (ref 19–32)
CREATININE: 0.69 mg/dL (ref 0.50–1.10)
GLUCOSE: 102 mg/dL — AB (ref 70–99)
Potassium: 4 mEq/L (ref 3.7–5.3)
Sodium: 138 mEq/L (ref 137–147)
Total Protein: 8.2 g/dL (ref 6.0–8.3)

## 2013-11-28 LAB — CBC WITH DIFFERENTIAL/PLATELET
BASOS ABS: 0 10*3/uL (ref 0.0–0.1)
Basophils Relative: 0 % (ref 0–1)
EOS PCT: 2 % (ref 0–5)
Eosinophils Absolute: 0.2 10*3/uL (ref 0.0–0.7)
HEMATOCRIT: 39.7 % (ref 36.0–46.0)
Hemoglobin: 13.1 g/dL (ref 12.0–15.0)
LYMPHS ABS: 2.3 10*3/uL (ref 0.7–4.0)
LYMPHS PCT: 20 % (ref 12–46)
MCH: 27.3 pg (ref 26.0–34.0)
MCHC: 33 g/dL (ref 30.0–36.0)
MCV: 82.9 fL (ref 78.0–100.0)
MONOS PCT: 7 % (ref 3–12)
Monocytes Absolute: 0.8 10*3/uL (ref 0.1–1.0)
NEUTROS ABS: 8 10*3/uL — AB (ref 1.7–7.7)
Neutrophils Relative %: 71 % (ref 43–77)
Platelets: 329 10*3/uL (ref 150–400)
RBC: 4.79 MIL/uL (ref 3.87–5.11)
RDW: 13.3 % (ref 11.5–15.5)
WBC: 11.2 10*3/uL — ABNORMAL HIGH (ref 4.0–10.5)

## 2013-11-28 LAB — URINE MICROSCOPIC-ADD ON

## 2013-11-28 LAB — URINALYSIS, ROUTINE W REFLEX MICROSCOPIC
Bilirubin Urine: NEGATIVE
GLUCOSE, UA: NEGATIVE mg/dL
KETONES UR: NEGATIVE mg/dL
Leukocytes, UA: NEGATIVE
NITRITE: NEGATIVE
PROTEIN: NEGATIVE mg/dL
Specific Gravity, Urine: 1.027 (ref 1.005–1.030)
Urobilinogen, UA: 0.2 mg/dL (ref 0.0–1.0)
pH: 6 (ref 5.0–8.0)

## 2013-11-28 LAB — LIPASE, BLOOD: LIPASE: 39 U/L (ref 11–59)

## 2013-11-28 MED ORDER — HYDROCODONE-ACETAMINOPHEN 5-325 MG PO TABS: 1.0000 | ORAL_TABLET | ORAL | Status: DC | PRN

## 2013-11-28 MED ORDER — SODIUM CHLORIDE 0.9 % IV SOLN
1000.0000 mL | Freq: Once | INTRAVENOUS | Status: AC
  Administered 2013-11-28: 1000 mL via INTRAVENOUS

## 2013-11-28 MED ORDER — ONDANSETRON HCL 4 MG PO TABS: 4.0000 mg | ORAL_TABLET | Freq: Four times a day (QID) | ORAL | Status: DC

## 2013-11-28 MED ORDER — HYDROMORPHONE HCL PF 1 MG/ML IJ SOLN
1.0000 mg | INTRAMUSCULAR | Status: DC | PRN
  Administered 2013-11-28 (×2): 1 mg via INTRAVENOUS
  Filled 2013-11-28 (×2): qty 1

## 2013-11-28 MED ORDER — SODIUM CHLORIDE 0.9 % IV SOLN: 1000.0000 mL | INTRAVENOUS | Status: DC

## 2013-11-28 MED ORDER — ONDANSETRON HCL 4 MG/2ML IJ SOLN
4.0000 mg | Freq: Once | INTRAMUSCULAR | Status: AC
Start: 2013-11-28 — End: 2013-11-28
  Administered 2013-11-28: 4 mg via INTRAVENOUS
  Filled 2013-11-28: qty 2

## 2013-11-28 NOTE — ED Provider Notes (Signed)
CSN: [id number]     Arrival date & time 11/28/13  1652 History   First MD Initiated Contact with Patient 11/28/13 1740     Chief Complaint  Patient presents with  . Abdominal Pain    Patient is a 43 y.o. female presenting with abdominal pain. The history is provided by the patient.  Abdominal Pain Pain location:  LLQ Pain quality: squeezing   Pain severity:  Moderate Duration:  2 days Progression:  Waxing and waning (There was an episode yesterday that resolved but came back today.) Chronicity:  New Ineffective treatments:  NSAIDs Associated symptoms: nausea   Associated symptoms: no constipation, no diarrhea, no dysuria, no fever and no vomiting     Past Medical History  Diagnosis Date  . Renal disorder   . Thyroid disease   . Hypertension   . Polycystic ovarian syndrome   . GERD (gastroesophageal reflux disease)   . Kidney stones   . History of kidney cancer   . Kidney stones   . Cancer     renal  . Thyroid cancer    Past Surgical History  Procedure Laterality Date  . Cesarean section    . Tonsillectomy    . Abdominal hysterectomy    . Partial nephrectomy     Family History  Problem Relation Age of Onset  . Hypertension Mother   . Diabetes Mother   . Hypertension Father   . Cancer Father    History  Substance Use Topics  . Smoking status: Never Smoker   . Smokeless tobacco: Never Used  . Alcohol Use: No   OB History   Grav Para Term Preterm Abortions TAB SAB Ect Mult Living                 Review of Systems  Constitutional: Negative for fever.  Gastrointestinal: Positive for nausea and abdominal pain. Negative for vomiting, diarrhea and constipation.  Genitourinary: Negative for dysuria.  All other systems reviewed and are negative.     Allergies  Demerol  Home Medications   Prior to Admission medications   Medication Sig Start Date End Date Taking? Authorizing Provider  hydrochlorothiazide (HYDRODIURIL) 25 MG tablet Take 25 mg by mouth  daily.   Yes Historical Provider, MD  ibuprofen (ADVIL,MOTRIN) 200 MG tablet Take 600 mg by mouth every 6 (six) hours as needed.   Yes Historical Provider, MD  levothyroxine (SYNTHROID, LEVOTHROID) 200 MCG tablet Take 200 mcg by mouth daily before breakfast.   Yes Historical Provider, MD  metoprolol tartrate (LOPRESSOR) 25 MG tablet Take 25 mg by mouth 2 (two) times daily.   Yes Historical Provider, MD  Multiple Vitamin (MULTIVITAMIN WITH MINERALS) TABS tablet Take 1 tablet by mouth daily.   Yes Historical Provider, MD  omeprazole (PRILOSEC) 20 MG capsule Take 20 mg by mouth daily.   Yes Historical Provider, MD  sertraline (ZOLOFT) 100 MG tablet Take 100 mg by mouth daily.   Yes Historical Provider, MD  HYDROcodone-acetaminophen (NORCO/VICODIN) 5-325 MG per tablet Take 1-2 tablets by mouth every 4 (four) hours as needed. 11/28/13   Dorie Rank, MD  ondansetron (ZOFRAN) 4 MG tablet Take 1 tablet (4 mg total) by mouth every 6 (six) hours. 11/28/13   Dorie Rank, MD   BP 137/88  Pulse 84  Temp(Src) 98.4 F (36.9 C) (Oral)  Resp 18  SpO2 96% Physical Exam  Nursing note and vitals reviewed. Constitutional: She appears well-developed and well-nourished. No distress.  HENT:  Head: Normocephalic and atraumatic.  Right Ear: External ear normal.  Left Ear: External ear normal.  Eyes: Conjunctivae are normal. Right eye exhibits no discharge. Left eye exhibits no discharge. No scleral icterus.  Neck: Neck supple. No tracheal deviation present.  Cardiovascular: Normal rate, regular rhythm and intact distal pulses.   Pulmonary/Chest: Effort normal and breath sounds normal. No stridor. No respiratory distress. She has no wheezes. She has no rales.  Abdominal: Soft. Bowel sounds are normal. She exhibits no distension. There is tenderness in the left lower quadrant. There is no rebound and no guarding. No hernia.  Musculoskeletal: She exhibits no edema and no tenderness.  Neurological: She is alert. She has  normal strength. No cranial nerve deficit (no facial droop, extraocular movements intact, no slurred speech) or sensory deficit. She exhibits normal muscle tone. She displays no seizure activity. Coordination normal.  Skin: Skin is warm and dry. No rash noted.  Psychiatric: She has a normal mood and affect.    ED Course  Procedures (including critical care time) Labs Review Labs Reviewed  CBC WITH DIFFERENTIAL - Abnormal; Notable for the following:    WBC 11.2 (*)    Neutro Abs 8.0 (*)    All other components within normal limits  COMPREHENSIVE METABOLIC PANEL - Abnormal; Notable for the following:    Glucose, Bld 102 (*)    AST 122 (*)    ALT 139 (*)    Total Bilirubin 0.2 (*)    Anion gap 16 (*)    All other components within normal limits  URINALYSIS, ROUTINE W REFLEX MICROSCOPIC - Abnormal; Notable for the following:    APPearance CLOUDY (*)    Hgb urine dipstick LARGE (*)    All other components within normal limits  URINE MICROSCOPIC-ADD ON - Abnormal; Notable for the following:    Squamous Epithelial / LPF FEW (*)    Bacteria, UA FEW (*)    All other components within normal limits  LIPASE, BLOOD    Medications  0.9 %  sodium chloride infusion (1,000 mLs Intravenous New Bag/Given 11/28/13 1829)    Followed by  0.9 %  sodium chloride infusion (not administered)  HYDROmorphone (DILAUDID) injection 1 mg (1 mg Intravenous Given 11/28/13 1905)  ondansetron (ZOFRAN) injection 4 mg (4 mg Intravenous Given 11/28/13 1830)    MDM   Final diagnoses:  Renal colic  Hematuria  Elevated LFTs    Pt has known kidney stones.  CT in February of this year demonstrated bilateral renal stones.  Pt has hematuria today.  Not on menses, has had a hysterectomy.  Will treat empirically for kidney stones, avoid repeat CT, radiation exposure.  Discussed follow up with her urologist to make sure the hematuria resolves.  Follow up with PCP regarding incidental lfts.      Dorie Rank,  MD 11/28/13 [phone]

## 2013-11-28 NOTE — Discharge Instructions (Signed)
Hematuria, Adult Hematuria is blood in your urine. It can be caused by a bladder infection, kidney infection, prostate infection, kidney stone, or cancer of your urinary tract. Infections can usually be treated with medicine, and a kidney stone usually will pass through your urine. If neither of these is the cause of your hematuria, further workup to find out the reason may be needed. It is very important that you tell your health care provider about any blood you see in your urine, even if the blood stops without treatment or happens without causing pain. Blood in your urine that happens and then stops and then happens again can be a symptom of a very serious condition. Also, pain is not a symptom in the initial stages of many urinary cancers. HOME CARE INSTRUCTIONS   Drink lots of fluid, 3-4 quarts a day. If you have been diagnosed with an infection, cranberry juice is especially recommended, in addition to large amounts of water.  Avoid caffeine, tea, and carbonated beverages, because they tend to irritate the bladder.  Avoid alcohol because it may irritate the prostate.  Only take over-the-counter or prescription medicines for pain, discomfort, or fever as directed by your health care provider.  If you have been diagnosed with a kidney stone, follow your health care provider's instructions regarding straining your urine to catch the stone.  Empty your bladder often. Avoid holding urine for long periods of time.  After a bowel movement, women should cleanse front to back. Use each tissue only once.  Empty your bladder before and after sexual intercourse if you are a female. SEEK MEDICAL CARE IF: You develop back pain, fever, a feeling of sickness in your stomach (nausea), or vomiting or if your symptoms are not better in 3 days. Return sooner if you are getting worse. SEEK IMMEDIATE MEDICAL CARE IF:   You have a persistent fever, with a temperature of 101.26F (38.8C) or greater.  You  develop severe vomiting and are unable to keep the medicine down.  You develop severe back or abdominal pain despite taking your medicines.  You begin passing a large amount of blood or clots in your urine.  You feel extremely weak or faint, or you pass out. MAKE SURE YOU:   Understand these instructions.  Will watch your condition.  Will get help right away if you are not doing well or get worse. Document Released: 05/08/2005 Document Revised: 02/26/2013 Document Reviewed: 01/06/2013 Kidney Stones Kidney stones (urolithiasis) are deposits that form inside your kidneys. The intense pain is caused by the stone moving through the urinary tract. When the stone moves, the ureter goes into spasm around the stone. The stone is usually passed in the urine.  CAUSES   A disorder that makes certain neck glands produce too much parathyroid hormone (primary hyperparathyroidism).  A buildup of uric acid crystals, similar to gout in your joints.  Narrowing (stricture) of the ureter.  A kidney obstruction present at birth (congenital obstruction).  Previous surgery on the kidney or ureters.  Numerous kidney infections. SYMPTOMS   Feeling sick to your stomach (nauseous).  Throwing up (vomiting).  Blood in the urine (hematuria).  Pain that usually spreads (radiates) to the groin.  Frequency or urgency of urination. DIAGNOSIS   Taking a history and physical exam.  Blood or urine tests.  CT scan.  Occasionally, an examination of the inside of the urinary bladder (cystoscopy) is performed. TREATMENT   Observation.  Increasing your fluid intake.  Extracorporeal shock wave lithotripsy--This  is a noninvasive procedure that uses shock waves to break up kidney stones.  Surgery may be needed if you have severe pain or persistent obstruction. There are various surgical procedures. Most of the procedures are performed with the use of small instruments. Only small incisions are needed to  accommodate these instruments, so recovery time is minimized. The size, location, and chemical composition are all important variables that will determine the proper choice of action for you. Talk to your health care provider to better understand your situation so that you will minimize the risk of injury to yourself and your kidney.  HOME CARE INSTRUCTIONS   Drink enough water and fluids to keep your urine clear or pale yellow. This will help you to pass the stone or stone fragments.  Strain all urine through the provided strainer. Keep all particulate matter and stones for your health care provider to see. The stone causing the pain may be as small as a grain of salt. It is very important to use the strainer each and every time you pass your urine. The collection of your stone will allow your health care provider to analyze it and verify that a stone has actually passed. The stone analysis will often identify what you can do to reduce the incidence of recurrences.  Only take over-the-counter or prescription medicines for pain, discomfort, or fever as directed by your health care provider.  Make a follow-up appointment with your health care provider as directed.  Get follow-up X-rays if required. The absence of pain does not always mean that the stone has passed. It may have only stopped moving. If the urine remains completely obstructed, it can cause loss of kidney function or even complete destruction of the kidney. It is your responsibility to make sure X-rays and follow-ups are completed. Ultrasounds of the kidney can show blockages and the status of the kidney. Ultrasounds are not associated with any radiation and can be performed easily in a matter of minutes. SEEK MEDICAL CARE IF:  You experience pain that is progressive and unresponsive to any pain medicine you have been prescribed. SEEK IMMEDIATE MEDICAL CARE IF:   Pain cannot be controlled with the prescribed medicine.  You have a  fever or shaking chills.  The severity or intensity of pain increases over 18 hours and is not relieved by pain medicine.  You develop a new onset of abdominal pain.  You feel faint or pass out.  You are unable to urinate. MAKE SURE YOU:   Understand these instructions.  Will watch your condition.  Will get help right away if you are not doing well or get worse. Document Released: 05/08/2005 Document Revised: 01/08/2013 Document Reviewed: 10/09/2012 [facility] Patient Information 2015 [place], [place]. This information is not intended to replace advice given to you by your health care provider. Make sure you discuss any questions you have with your health care provider.  [employer] Patient Information 2015 [place]. This information is not intended to replace advice given to you by your health care provider. Make sure you discuss any questions you have with your health care provider.

## 2013-11-28 NOTE — ED Notes (Signed)
Pt c/o LLQ that started yesterday but went away. Pt states that today it has come back and been constant. Pt states that she might could have blood in her urine, but denies have urinary problems. Pt has PMH kidney stones (but states it feels different) and partial hysterectomy.

## 2013-12-04 ENCOUNTER — Emergency Department (HOSPITAL_COMMUNITY): Payer: BC Managed Care – PPO

## 2013-12-04 ENCOUNTER — Emergency Department (HOSPITAL_COMMUNITY)
Admission: EM | Admit: 2013-12-04 | Discharge: 2013-12-04 | Disposition: A | Discharge status: Home / Self Care | Payer: Self-pay | Attending: Emergency Medicine | Admitting: Emergency Medicine

## 2013-12-04 ENCOUNTER — Encounter (HOSPITAL_COMMUNITY): Payer: Self-pay | Admitting: Emergency Medicine

## 2013-12-04 DIAGNOSIS — Z79899 Other long term (current) drug therapy: Secondary | ICD-10-CM | POA: Insufficient documentation

## 2013-12-04 DIAGNOSIS — Z90710 Acquired absence of both cervix and uterus: Secondary | ICD-10-CM | POA: Insufficient documentation

## 2013-12-04 DIAGNOSIS — N390 Urinary tract infection, site not specified: Secondary | ICD-10-CM

## 2013-12-04 DIAGNOSIS — Z85850 Personal history of malignant neoplasm of thyroid: Secondary | ICD-10-CM | POA: Insufficient documentation

## 2013-12-04 DIAGNOSIS — K219 Gastro-esophageal reflux disease without esophagitis: Secondary | ICD-10-CM | POA: Insufficient documentation

## 2013-12-04 DIAGNOSIS — Z85528 Personal history of other malignant neoplasm of kidney: Secondary | ICD-10-CM | POA: Insufficient documentation

## 2013-12-04 DIAGNOSIS — I10 Essential (primary) hypertension: Secondary | ICD-10-CM | POA: Insufficient documentation

## 2013-12-04 DIAGNOSIS — N23 Unspecified renal colic: Secondary | ICD-10-CM

## 2013-12-04 DIAGNOSIS — Z87442 Personal history of urinary calculi: Secondary | ICD-10-CM | POA: Insufficient documentation

## 2013-12-04 DIAGNOSIS — E079 Disorder of thyroid, unspecified: Secondary | ICD-10-CM | POA: Insufficient documentation

## 2013-12-04 LAB — URINE MICROSCOPIC-ADD ON

## 2013-12-04 LAB — CBC WITH DIFFERENTIAL/PLATELET
Basophils Absolute: 0 10*3/uL (ref 0.0–0.1)
Basophils Relative: 0 % (ref 0–1)
EOS PCT: 2 % (ref 0–5)
Eosinophils Absolute: 0.2 10*3/uL (ref 0.0–0.7)
HEMATOCRIT: 38.5 % (ref 36.0–46.0)
HEMOGLOBIN: 12.7 g/dL (ref 12.0–15.0)
LYMPHS ABS: 2 10*3/uL (ref 0.7–4.0)
Lymphocytes Relative: 23 % (ref 12–46)
MCH: 27.4 pg (ref 26.0–34.0)
MCHC: 33 g/dL (ref 30.0–36.0)
MCV: 83.2 fL (ref 78.0–100.0)
MONOS PCT: 6 % (ref 3–12)
Monocytes Absolute: 0.6 10*3/uL (ref 0.1–1.0)
Neutro Abs: 6 10*3/uL (ref 1.7–7.7)
Neutrophils Relative %: 69 % (ref 43–77)
Platelets: 348 10*3/uL (ref 150–400)
RBC: 4.63 MIL/uL (ref 3.87–5.11)
RDW: 13.2 % (ref 11.5–15.5)
WBC: 8.7 10*3/uL (ref 4.0–10.5)

## 2013-12-04 LAB — BASIC METABOLIC PANEL
Anion gap: 14 (ref 5–15)
BUN: 13 mg/dL (ref 6–23)
CALCIUM: 10 mg/dL (ref 8.4–10.5)
CHLORIDE: 99 meq/L (ref 96–112)
CO2: 27 meq/L (ref 19–32)
CREATININE: 0.92 mg/dL (ref 0.50–1.10)
GFR calc Af Amer: 88 mL/min — ABNORMAL LOW (ref >90)
GFR calc non Af Amer: 76 mL/min — ABNORMAL LOW (ref >90)
GLUCOSE: 116 mg/dL — AB (ref 70–99)
Potassium: 3.8 mEq/L (ref 3.7–5.3)
SODIUM: 140 meq/L (ref 137–147)

## 2013-12-04 LAB — URINALYSIS, ROUTINE W REFLEX MICROSCOPIC
Bilirubin Urine: NEGATIVE
GLUCOSE, UA: NEGATIVE mg/dL
KETONES UR: NEGATIVE mg/dL
Nitrite: NEGATIVE
Protein, ur: NEGATIVE mg/dL
Specific Gravity, Urine: 1.025 (ref 1.005–1.030)
Urobilinogen, UA: 0.2 mg/dL (ref 0.0–1.0)
pH: 7.5 (ref 5.0–8.0)

## 2013-12-04 MED ORDER — LORAZEPAM 2 MG/ML IJ SOLN
1.0000 mg | Freq: Once | INTRAMUSCULAR | Status: AC
  Administered 2013-12-04: 1 mg via INTRAVENOUS
  Filled 2013-12-04: qty 1

## 2013-12-04 MED ORDER — CEPHALEXIN 500 MG PO CAPS: 500.0000 mg | ORAL_CAPSULE | Freq: Four times a day (QID) | ORAL | Status: DC

## 2013-12-04 MED ORDER — CEFTRIAXONE SODIUM 1 G IJ SOLR
1.0000 g | INTRAMUSCULAR | Status: DC
  Administered 2013-12-04: 1 g via INTRAVENOUS
  Filled 2013-12-04: qty 10

## 2013-12-04 MED ORDER — HYDROMORPHONE HCL PF 1 MG/ML IJ SOLN
1.0000 mg | Freq: Once | INTRAMUSCULAR | Status: AC
  Administered 2013-12-04: 1 mg via INTRAVENOUS
  Filled 2013-12-04: qty 1

## 2013-12-04 MED ORDER — ONDANSETRON HCL 4 MG/2ML IJ SOLN
4.0000 mg | Freq: Once | INTRAMUSCULAR | Status: AC
  Administered 2013-12-04: 4 mg via INTRAVENOUS

## 2013-12-04 MED ORDER — SODIUM CHLORIDE 0.9 % IV SOLN
INTRAVENOUS | Status: DC
  Administered 2013-12-04: 17:00:00 via INTRAVENOUS

## 2013-12-04 MED ORDER — ONDANSETRON HCL 4 MG/2ML IJ SOLN
4.0000 mg | Freq: Once | INTRAMUSCULAR | Status: AC
  Administered 2013-12-04: 4 mg via INTRAVENOUS
  Filled 2013-12-04: qty 2

## 2013-12-04 MED ORDER — ONDANSETRON 8 MG PO TBDP: 8.0000 mg | ORAL_TABLET | Freq: Three times a day (TID) | ORAL | Status: DC | PRN

## 2013-12-04 MED ORDER — OXYCODONE-ACETAMINOPHEN 7.5-325 MG PO TABS: 1.0000 | ORAL_TABLET | ORAL | Status: DC | PRN

## 2013-12-04 MED ORDER — ONDANSETRON HCL 4 MG/2ML IJ SOLN
4.0000 mg | Freq: Once | INTRAMUSCULAR | Status: DC
  Filled 2013-12-04: qty 2

## 2013-12-04 MED ORDER — LORAZEPAM 2 MG/ML IJ SOLN
0.5000 mg | Freq: Once | INTRAMUSCULAR | Status: AC
  Administered 2013-12-04: 0.5 mg via INTRAVENOUS
  Filled 2013-12-04: qty 1

## 2013-12-04 NOTE — ED Notes (Signed)
Gave Patient Warm Blanket. Added some tape to IV that was coming lose.

## 2013-12-04 NOTE — Discharge Instructions (Signed)
Followup with the urologist as already scheduled Kidney Stones Kidney stones (urolithiasis) are deposits that form inside your kidneys. The intense pain is caused by the stone moving through the urinary tract. When the stone moves, the ureter goes into spasm around the stone. The stone is usually passed in the urine.  CAUSES   A disorder that makes certain neck glands produce too much parathyroid hormone (primary hyperparathyroidism).  A buildup of uric acid crystals, similar to gout in your joints.  Narrowing (stricture) of the ureter.  A kidney obstruction present at birth (congenital obstruction).  Previous surgery on the kidney or ureters.  Numerous kidney infections. SYMPTOMS   Feeling sick to your stomach (nauseous).  Throwing up (vomiting).  Blood in the urine (hematuria).  Pain that usually spreads (radiates) to the groin.  Frequency or urgency of urination. DIAGNOSIS   Taking a history and physical exam.  Blood or urine tests.  CT scan.  Occasionally, an examination of the inside of the urinary bladder (cystoscopy) is performed. TREATMENT   Observation.  Increasing your fluid intake.  Extracorporeal shock wave lithotripsy--This is a noninvasive procedure that uses shock waves to break up kidney stones.  Surgery may be needed if you have severe pain or persistent obstruction. There are various surgical procedures. Most of the procedures are performed with the use of small instruments. Only small incisions are needed to accommodate these instruments, so recovery time is minimized. The size, location, and chemical composition are all important variables that will determine the proper choice of action for you. Talk to your health care provider to better understand your situation so that you will minimize the risk of injury to yourself and your kidney.  HOME CARE INSTRUCTIONS   Drink enough water and fluids to keep your urine clear or pale yellow. This will help  you to pass the stone or stone fragments.  Strain all urine through the provided strainer. Keep all particulate matter and stones for your health care provider to see. The stone causing the pain may be as small as a grain of salt. It is very important to use the strainer each and every time you pass your urine. The collection of your stone will allow your health care provider to analyze it and verify that a stone has actually passed. The stone analysis will often identify what you can do to reduce the incidence of recurrences.  Only take over-the-counter or prescription medicines for pain, discomfort, or fever as directed by your health care provider.  Make a follow-up appointment with your health care provider as directed.  Get follow-up X-rays if required. The absence of pain does not always mean that the stone has passed. It may have only stopped moving. If the urine remains completely obstructed, it can cause loss of kidney function or even complete destruction of the kidney. It is your responsibility to make sure X-rays and follow-ups are completed. Ultrasounds of the kidney can show blockages and the status of the kidney. Ultrasounds are not associated with any radiation and can be performed easily in a matter of minutes. SEEK MEDICAL CARE IF:  You experience pain that is progressive and unresponsive to any pain medicine you have been prescribed. SEEK IMMEDIATE MEDICAL CARE IF:   Pain cannot be controlled with the prescribed medicine.  You have a fever or shaking chills.  The severity or intensity of pain increases over 18 hours and is not relieved by pain medicine.  You develop a new onset of abdominal pain.  You feel faint or pass out.  You are unable to urinate. MAKE SURE YOU:   Understand these instructions.  Will watch your condition.  Will get help right away if you are not doing well or get worse. Document Released: 05/08/2005 Document Revised: 01/08/2013 Document  Reviewed: 10/09/2012 [facility] Patient Information 2015 [place], [place]. This information is not intended to replace advice given to you by your health care provider. Make sure you discuss any questions you have with your health care provider. Urinary Tract Infection Urinary tract infections (UTIs) can develop anywhere along your urinary tract. Your urinary tract is your body's drainage system for removing wastes and extra water. Your urinary tract includes two kidneys, two ureters, a bladder, and a urethra. Your kidneys are a pair of bean-shaped organs. Each kidney is about the size of your fist. They are located below your ribs, one on each side of your spine. CAUSES Infections are caused by microbes, which are microscopic organisms, including fungi, viruses, and bacteria. These organisms are so small that they can only be seen through a microscope. Bacteria are the microbes that most commonly cause UTIs. SYMPTOMS  Symptoms of UTIs may vary by age and gender of the patient and by the location of the infection. Symptoms in young women typically include a frequent and intense urge to urinate and a painful, burning feeling in the bladder or urethra during urination. Older women and men are more likely to be tired, shaky, and weak and have muscle aches and abdominal pain. A fever may mean the infection is in your kidneys. Other symptoms of a kidney infection include pain in your back or sides below the ribs, nausea, and vomiting. DIAGNOSIS To diagnose a UTI, your caregiver will ask you about your symptoms. Your caregiver also will ask to provide a urine sample. The urine sample will be tested for bacteria and white blood cells. White blood cells are made by your body to help fight infection. TREATMENT  Typically, UTIs can be treated with medication. Because most UTIs are caused by a bacterial infection, they usually can be treated with the use of antibiotics. The choice of antibiotic and length of treatment  depend on your symptoms and the type of bacteria causing your infection. HOME CARE INSTRUCTIONS  If you were prescribed antibiotics, take them exactly as your caregiver instructs you. Finish the medication even if you feel better after you have only taken some of the medication.  Drink enough water and fluids to keep your urine clear or pale yellow.  Avoid caffeine, tea, and carbonated beverages. They tend to irritate your bladder.  Empty your bladder often. Avoid holding urine for long periods of time.  Empty your bladder before and after sexual intercourse.  After a bowel movement, women should cleanse from front to back. Use each tissue only once. SEEK MEDICAL CARE IF:   You have back pain.  You develop a fever.  Your symptoms do not begin to resolve within 3 days. SEEK IMMEDIATE MEDICAL CARE IF:   You have severe back pain or lower abdominal pain.  You develop chills.  You have nausea or vomiting.  You have continued burning or discomfort with urination. MAKE SURE YOU:   Understand these instructions.  Will watch your condition.  Will get help right away if you are not doing well or get worse. Document Released: 02/15/2005 Document Revised: 11/07/2011 Document Reviewed: 06/16/2011 [facility] Patient Information 2015 [place], [place]. This information is not intended to replace advice given to you  by your health care provider. Make sure you discuss any questions you have with your health care provider. ° °

## 2013-12-04 NOTE — ED Notes (Signed)
US at bedside

## 2013-12-04 NOTE — ED Notes (Signed)
Per patient, states diagnosed with kidney stone last week-increased pain, nausea, and left flank pain-urology appointment next week

## 2013-12-04 NOTE — ED Notes (Signed)
Pt rocking body on side of the bed, states she's still in pain and dry heaving, medication to be given before discharge.

## 2013-12-04 NOTE — ED Provider Notes (Signed)
CSN: [id number]     Arrival date & time 12/04/13  1530 History   First MD Initiated Contact with Patient 12/04/13 1555     Chief Complaint  Patient presents with  . Emesis     (Consider location/radiation/quality/duration/timing/severity/associated sxs/prior Treatment) Patient is a 43 y.o. female presenting with vomiting. The history is provided by the patient.  Emesis  patient here with worsening left-sided flank pain similar to her renal colic. Diagnosed with kidney stones here a week ago it continues with hematuria. She is status post hysterectomy. Denies any dysuria. No change in the sellar urine. She's had nausea and vomiting without fever or chills. Symptoms persisted. She has used her home meds without relief. Called her urologist and was told to come here  Past Medical History  Diagnosis Date  . Renal disorder   . Thyroid disease   . Hypertension   . Polycystic ovarian syndrome   . GERD (gastroesophageal reflux disease)   . Kidney stones   . History of kidney cancer   . Kidney stones   . Cancer     renal  . Thyroid cancer    Past Surgical History  Procedure Laterality Date  . Cesarean section    . Tonsillectomy    . Abdominal hysterectomy    . Partial nephrectomy     Family History  Problem Relation Age of Onset  . Hypertension Mother   . Diabetes Mother   . Hypertension Father   . Cancer Father    History  Substance Use Topics  . Smoking status: Never Smoker   . Smokeless tobacco: Never Used  . Alcohol Use: No   OB History   Grav Para Term Preterm Abortions TAB SAB Ect Mult Living                 Review of Systems  Gastrointestinal: Positive for vomiting.  All other systems reviewed and are negative.     Allergies  Demerol  Home Medications   Prior to Admission medications   Medication Sig Start Date End Date Taking? Authorizing Provider  hydrochlorothiazide (HYDRODIURIL) 25 MG tablet Take 25 mg by mouth daily.    Historical Provider, MD   HYDROcodone-acetaminophen (NORCO/VICODIN) 5-325 MG per tablet Take 1-2 tablets by mouth every 4 (four) hours as needed. 11/28/13   Dorie Rank, MD  ibuprofen (ADVIL,MOTRIN) 200 MG tablet Take 600 mg by mouth every 6 (six) hours as needed.    Historical Provider, MD  levothyroxine (SYNTHROID, LEVOTHROID) 200 MCG tablet Take 200 mcg by mouth daily before breakfast.    Historical Provider, MD  metoprolol tartrate (LOPRESSOR) 25 MG tablet Take 25 mg by mouth 2 (two) times daily.    Historical Provider, MD  Multiple Vitamin (MULTIVITAMIN WITH MINERALS) TABS tablet Take 1 tablet by mouth daily.    Historical Provider, MD  omeprazole (PRILOSEC) 20 MG capsule Take 20 mg by mouth daily.    Historical Provider, MD  ondansetron (ZOFRAN) 4 MG tablet Take 1 tablet (4 mg total) by mouth every 6 (six) hours. 11/28/13   Dorie Rank, MD  sertraline (ZOLOFT) 100 MG tablet Take 100 mg by mouth daily.    Historical Provider, MD   BP 158/83  Pulse 104  Temp(Src) 98.7 F (37.1 C) (Oral)  Resp 18  SpO2 98% Physical Exam  Nursing note and vitals reviewed. Constitutional: She is oriented to person, place, and time. She appears well-developed and well-nourished.  Non-toxic appearance. No distress.  HENT:  Head: Normocephalic and atraumatic.  Eyes: Conjunctivae, EOM and lids are normal. Pupils are equal, round, and reactive to light.  Neck: Normal range of motion. Neck supple. No tracheal deviation present. No mass present.  Cardiovascular: Normal rate, regular rhythm and normal heart sounds.  Exam reveals no gallop.   No murmur heard. Pulmonary/Chest: Effort normal and breath sounds normal. No stridor. No respiratory distress. She has no decreased breath sounds. She has no wheezes. She has no rhonchi. She has no rales.  Abdominal: Soft. Normal appearance and bowel sounds are normal. She exhibits no distension. There is no tenderness. There is CVA tenderness. There is no rebound.  Musculoskeletal: Normal range of motion.  She exhibits no edema and no tenderness.  Neurological: She is alert and oriented to person, place, and time. She has normal strength. No cranial nerve deficit or sensory deficit. GCS eye subscore is 4. GCS verbal subscore is 5. GCS motor subscore is 6.  Skin: Skin is warm and dry. No abrasion and no rash noted.  Psychiatric: She has a normal mood and affect. Her speech is normal and behavior is normal.    ED Course  Procedures (including critical care time) Labs Review Labs Reviewed  CBC WITH DIFFERENTIAL  URINALYSIS, ROUTINE W REFLEX MICROSCOPIC  BASIC METABOLIC PANEL    Imaging Review No results found.   EKG Interpretation None      MDM   Final diagnoses:  None    Patient given IV fluids and antibiotics for her UTI. Also given pain meds as well 2. Feels better at this time. Will be described Keflex for UTI and additional pain meds. She will followup with her urologist    Leota Jacobsen, MD 12/04/13 [phone]

## 2013-12-14 ENCOUNTER — Emergency Department (HOSPITAL_COMMUNITY)
Admission: EM | Admit: 2013-12-14 | Discharge: 2013-12-14 | Disposition: A | Discharge status: Home / Self Care | Payer: BC Managed Care – PPO | Attending: Emergency Medicine | Admitting: Emergency Medicine

## 2013-12-14 ENCOUNTER — Encounter (HOSPITAL_COMMUNITY): Payer: Self-pay | Admitting: Emergency Medicine

## 2013-12-14 DIAGNOSIS — Z87448 Personal history of other diseases of urinary system: Secondary | ICD-10-CM | POA: Insufficient documentation

## 2013-12-14 DIAGNOSIS — E079 Disorder of thyroid, unspecified: Secondary | ICD-10-CM | POA: Insufficient documentation

## 2013-12-14 DIAGNOSIS — K219 Gastro-esophageal reflux disease without esophagitis: Secondary | ICD-10-CM | POA: Insufficient documentation

## 2013-12-14 DIAGNOSIS — Z79899 Other long term (current) drug therapy: Secondary | ICD-10-CM | POA: Insufficient documentation

## 2013-12-14 DIAGNOSIS — Z90710 Acquired absence of both cervix and uterus: Secondary | ICD-10-CM | POA: Insufficient documentation

## 2013-12-14 DIAGNOSIS — Z8742 Personal history of other diseases of the female genital tract: Secondary | ICD-10-CM | POA: Insufficient documentation

## 2013-12-14 DIAGNOSIS — Z85850 Personal history of malignant neoplasm of thyroid: Secondary | ICD-10-CM | POA: Insufficient documentation

## 2013-12-14 DIAGNOSIS — R112 Nausea with vomiting, unspecified: Secondary | ICD-10-CM | POA: Insufficient documentation

## 2013-12-14 DIAGNOSIS — R300 Dysuria: Secondary | ICD-10-CM | POA: Insufficient documentation

## 2013-12-14 DIAGNOSIS — I10 Essential (primary) hypertension: Secondary | ICD-10-CM | POA: Insufficient documentation

## 2013-12-14 DIAGNOSIS — Z87442 Personal history of urinary calculi: Secondary | ICD-10-CM | POA: Insufficient documentation

## 2013-12-14 DIAGNOSIS — R109 Unspecified abdominal pain: Secondary | ICD-10-CM

## 2013-12-14 DIAGNOSIS — Z792 Long term (current) use of antibiotics: Secondary | ICD-10-CM | POA: Insufficient documentation

## 2013-12-14 DIAGNOSIS — Z85528 Personal history of other malignant neoplasm of kidney: Secondary | ICD-10-CM | POA: Insufficient documentation

## 2013-12-14 LAB — URINALYSIS, ROUTINE W REFLEX MICROSCOPIC
Bilirubin Urine: NEGATIVE
Glucose, UA: NEGATIVE mg/dL
Ketones, ur: NEGATIVE mg/dL
NITRITE: NEGATIVE
Protein, ur: NEGATIVE mg/dL
SPECIFIC GRAVITY, URINE: 1.023 (ref 1.005–1.030)
Urobilinogen, UA: 0.2 mg/dL (ref 0.0–1.0)
pH: 5.5 (ref 5.0–8.0)

## 2013-12-14 LAB — CBC WITH DIFFERENTIAL/PLATELET
BASOS PCT: 0 % (ref 0–1)
Basophils Absolute: 0.1 10*3/uL (ref 0.0–0.1)
Eosinophils Absolute: 0.2 10*3/uL (ref 0.0–0.7)
Eosinophils Relative: 1 % (ref 0–5)
HCT: 39.2 % (ref 36.0–46.0)
HEMOGLOBIN: 12.9 g/dL (ref 12.0–15.0)
LYMPHS ABS: 3.2 10*3/uL (ref 0.7–4.0)
Lymphocytes Relative: 24 % (ref 12–46)
MCH: 27.5 pg (ref 26.0–34.0)
MCHC: 32.9 g/dL (ref 30.0–36.0)
MCV: 83.6 fL (ref 78.0–100.0)
MONOS PCT: 6 % (ref 3–12)
Monocytes Absolute: 0.8 10*3/uL (ref 0.1–1.0)
NEUTROS ABS: 9 10*3/uL — AB (ref 1.7–7.7)
NEUTROS PCT: 69 % (ref 43–77)
Platelets: 324 10*3/uL (ref 150–400)
RBC: 4.69 MIL/uL (ref 3.87–5.11)
RDW: 13.4 % (ref 11.5–15.5)
WBC: 13.2 10*3/uL — ABNORMAL HIGH (ref 4.0–10.5)

## 2013-12-14 LAB — BASIC METABOLIC PANEL
Anion gap: 16 — ABNORMAL HIGH (ref 5–15)
BUN: 15 mg/dL (ref 6–23)
CHLORIDE: 99 meq/L (ref 96–112)
CO2: 24 mEq/L (ref 19–32)
Calcium: 9.9 mg/dL (ref 8.4–10.5)
Creatinine, Ser: 0.66 mg/dL (ref 0.50–1.10)
GFR calc non Af Amer: >90 mL/min (ref >90)
GLUCOSE: 98 mg/dL (ref 70–99)
POTASSIUM: 4.2 meq/L (ref 3.7–5.3)
Sodium: 139 mEq/L (ref 137–147)

## 2013-12-14 LAB — URINE MICROSCOPIC-ADD ON

## 2013-12-14 MED ORDER — ONDANSETRON HCL 4 MG/2ML IJ SOLN
4.0000 mg | Freq: Once | INTRAMUSCULAR | Status: AC
  Administered 2013-12-14: 4 mg via INTRAVENOUS
  Filled 2013-12-14: qty 2

## 2013-12-14 MED ORDER — ONDANSETRON 4 MG PO TBDP: 4.0000 mg | ORAL_TABLET | Freq: Three times a day (TID) | ORAL | Status: DC | PRN

## 2013-12-14 MED ORDER — CEPHALEXIN 500 MG PO CAPS: 500.0000 mg | ORAL_CAPSULE | Freq: Four times a day (QID) | ORAL | Status: DC

## 2013-12-14 MED ORDER — ONDANSETRON 8 MG PO TBDP
8.0000 mg | ORAL_TABLET | Freq: Once | ORAL | Status: AC
  Administered 2013-12-14: 8 mg via ORAL
  Filled 2013-12-14: qty 1

## 2013-12-14 MED ORDER — HYDROMORPHONE HCL PF 1 MG/ML IJ SOLN
1.0000 mg | INTRAMUSCULAR | Status: AC | PRN
  Administered 2013-12-14 (×2): 1 mg via INTRAVENOUS
  Filled 2013-12-14 (×2): qty 1

## 2013-12-14 MED ORDER — DEXTROSE 5 % IV SOLN
1.0000 g | Freq: Once | INTRAVENOUS | Status: AC
  Administered 2013-12-14: 1 g via INTRAVENOUS
  Filled 2013-12-14: qty 10

## 2013-12-14 MED ORDER — OXYCODONE-ACETAMINOPHEN 5-325 MG PO TABS: 2.0000 | ORAL_TABLET | ORAL | Status: DC | PRN

## 2013-12-14 NOTE — ED Notes (Signed)
Pt ambulatory to room.

## 2013-12-14 NOTE — ED Notes (Signed)
Pt seen on 7/16 here, treated for UTI. Completed 7 day round of Keflex. Today, began vomiting again, chills, suprapubic and bilateral flank pain returned.

## 2013-12-14 NOTE — ED Provider Notes (Signed)
CSN: [id number]     Arrival date & time 12/14/13  1802 History   First MD Initiated Contact with Patient 12/14/13 2016     Chief Complaint  Patient presents with  . Nausea  . Emesis  . Flank Pain  . Dysuria     HPI  Vision presents with right flank pain. History of multiple kidney stones. At one point many years ago had renal cell carcinoma diagnosed with an incidental finding on the CT scan obtained for a stone. Underwent partial resection of her right kidney. Has recovered well. Has intermittent episodes of flank pain is having an episode today. History of stones. History of infections. Her most recent studies have not shown passage of stone her most recent cultures have not shown significant bacterial growth. Seen last week and placed on antibiotics. No culture obtained. She improved for 48 hours. Had was able to stop taking her pain medication. Finish antibiotics 4 days ago. Within 48 hours had recurrence of symptoms. Worsening today with vomiting and presents here.  Past Medical History  Diagnosis Date  . Renal disorder   . Thyroid disease   . Hypertension   . Polycystic ovarian syndrome   . GERD (gastroesophageal reflux disease)   . Kidney stones   . History of kidney cancer   . Kidney stones   . Cancer     renal  . Thyroid cancer    Past Surgical History  Procedure Laterality Date  . Cesarean section    . Tonsillectomy    . Abdominal hysterectomy    . Partial nephrectomy     Family History  Problem Relation Age of Onset  . Hypertension Mother   . Diabetes Mother   . Hypertension Father   . Cancer Father    History  Substance Use Topics  . Smoking status: Never Smoker   . Smokeless tobacco: Never Used  . Alcohol Use: No   OB History   Grav Para Term Preterm Abortions TAB SAB Ect Mult Living                 Review of Systems  Constitutional: Negative for fever, chills, diaphoresis, appetite change and fatigue.  HENT: Negative for mouth sores, sore throat  and trouble swallowing.   Eyes: Negative for visual disturbance.  Respiratory: Negative for cough, chest tightness, shortness of breath and wheezing.   Cardiovascular: Negative for chest pain.  Gastrointestinal: Positive for nausea and vomiting. Negative for abdominal pain, diarrhea and abdominal distention.  Endocrine: Negative for polydipsia, polyphagia and polyuria.  Genitourinary: Positive for dysuria and flank pain. Negative for frequency and hematuria.  Musculoskeletal: Negative for gait problem.  Skin: Negative for color change, pallor and rash.  Neurological: Negative for dizziness, syncope, light-headedness and headaches.  Hematological: Does not bruise/bleed easily.  Psychiatric/Behavioral: Negative for behavioral problems and confusion.      Allergies  Demerol  Home Medications   Prior to Admission medications   Medication Sig Start Date End Date Taking? Authorizing Provider  hydrochlorothiazide (HYDRODIURIL) 25 MG tablet Take 25 mg by mouth daily.   Yes Historical Provider, MD  ibuprofen (ADVIL,MOTRIN) 200 MG tablet Take 600 mg by mouth every 6 (six) hours as needed for moderate pain.    Yes Historical Provider, MD  levothyroxine (SYNTHROID, LEVOTHROID) 200 MCG tablet Take 200 mcg by mouth daily before breakfast.   Yes Historical Provider, MD  metoprolol tartrate (LOPRESSOR) 25 MG tablet Take 25 mg by mouth 2 (two) times daily.   Yes Historical  Provider, MD  Multiple Vitamin (MULTIVITAMIN WITH MINERALS) TABS tablet Take 1 tablet by mouth daily.   Yes Historical Provider, MD  omeprazole (PRILOSEC) 20 MG capsule Take 20 mg by mouth daily.   Yes Historical Provider, MD  ondansetron (ZOFRAN ODT) 8 MG disintegrating tablet Take 1 tablet (8 mg total) by mouth every 8 (eight) hours as needed for nausea or vomiting. 12/04/13  Yes Leota Jacobsen, MD  oxyCODONE-acetaminophen (PERCOCET) 7.5-325 MG per tablet Take 1 tablet by mouth every 4 (four) hours as needed for pain. 12/04/13  Yes  Leota Jacobsen, MD  sertraline (ZOLOFT) 100 MG tablet Take 100 mg by mouth daily.   Yes Historical Provider, MD  cephALEXin (KEFLEX) 500 MG capsule Take 1 capsule (500 mg total) by mouth 4 (four) times daily. 12/04/13   Leota Jacobsen, MD  cephALEXin (KEFLEX) 500 MG capsule Take 1 capsule (500 mg total) by mouth 4 (four) times daily. 12/14/13   Tanna Furry, MD  ondansetron (ZOFRAN ODT) 4 MG disintegrating tablet Take 1 tablet (4 mg total) by mouth every 8 (eight) hours as needed for nausea. 12/14/13   Tanna Furry, MD  oxyCODONE-acetaminophen (PERCOCET/ROXICET) 5-325 MG per tablet Take 2 tablets by mouth every 4 (four) hours as needed. 12/14/13   Tanna Furry, MD   BP 138/92  Pulse 88  Temp(Src) 98.6 F (37 C) (Oral)  Resp 20  SpO2 99% Physical Exam  Constitutional: She is oriented to person, place, and time. She appears well-developed and well-nourished. No distress.  HENT:  Head: Normocephalic.  Eyes: Conjunctivae are normal. Pupils are equal, round, and reactive to light. No scleral icterus.  Neck: Normal range of motion. Neck supple. No thyromegaly present.  Cardiovascular: Normal rate and regular rhythm.  Exam reveals no gallop and no friction rub.   No murmur heard. Pulmonary/Chest: Effort normal and breath sounds normal. No respiratory distress. She has no wheezes. She has no rales.  Abdominal: Soft. Bowel sounds are normal. She exhibits no distension. There is no tenderness. There is no rebound.  Musculoskeletal: Normal range of motion.       Arms: Neurological: She is alert and oriented to person, place, and time.  Skin: Skin is warm and dry. No rash noted.  Psychiatric: She has a normal mood and affect. Her behavior is normal.    ED Course  Procedures (including critical care time) Labs Review Labs Reviewed  URINALYSIS, ROUTINE W REFLEX MICROSCOPIC - Abnormal; Notable for the following:    APPearance CLOUDY (*)    Hgb urine dipstick LARGE (*)    Leukocytes, UA SMALL (*)     All other components within normal limits  CBC WITH DIFFERENTIAL - Abnormal; Notable for the following:    WBC 13.2 (*)    Neutro Abs 9.0 (*)    All other components within normal limits  BASIC METABOLIC PANEL - Abnormal; Notable for the following:    Anion gap 16 (*)    All other components within normal limits  URINE CULTURE  URINE MICROSCOPIC-ADD ON    Imaging Review No results found.   EKG Interpretation None      MDM   Final diagnoses:  Flank pain    Again her urine shows signs of infection, cloudy with leukocytes blood and bacteria. Although nitrite negative. Culture pending. Her most recent cultures as shown no significant growth or polymicrobial. She was on Keflex last week it improved for several days. She said she took pain medicine for 2 days and  didn't finish because her symptoms improved. When her pain restarted today she states she too nauseated and vomiting to keep down her medications. She still has her pain medicine at home. On multiple recent study she has not shown passage of stones via renal ultrasound or CT scan. He follows with Dr. Tresa Moore urology. Her symptoms are well-controlled here. I think it is syncopal to discharge her and ask her to followup with her urologist. Plan symptom control antibiotics pending culture.    Tanna Furry, MD 12/14/13 2238

## 2013-12-14 NOTE — ED Notes (Signed)
Lab called regarding BMP results, should have them in the next 10 minutes.

## 2013-12-14 NOTE — Discharge Instructions (Signed)
Flank Pain °Flank pain refers to pain that is located on the side of the body between the upper abdomen and the back. The pain may occur over a short period of time (acute) or may be long-term or reoccurring (chronic). It may be mild or severe. Flank pain can be caused by many things. °CAUSES  °Some of the more common causes of flank pain include: °· Muscle strains.   °· Muscle spasms.   °· A disease of your spine (vertebral disk disease).   °· A lung infection (pneumonia).   °· Fluid around your lungs (pulmonary edema).   °· A kidney infection.   °· Kidney stones.   °· A very painful skin rash caused by the chickenpox virus (shingles).   °· Gallbladder disease.   °HOME CARE INSTRUCTIONS  °Home care will depend on the cause of your pain. In general, °· Rest as directed by your caregiver. °· Drink enough fluids to keep your urine clear or pale yellow. °· Only take over-the-counter or prescription medicines as directed by your caregiver. Some medicines may help relieve the pain. °· Tell your caregiver about any changes in your pain. °· Follow up with your caregiver as directed. °SEEK IMMEDIATE MEDICAL CARE IF:  °· Your pain is not controlled with medicine.   °· You have new or worsening symptoms. °· Your pain increases.   °· You have abdominal pain.   °· You have shortness of breath.   °· You have persistent nausea or vomiting.   °· You have swelling in your abdomen.   °· You feel faint or pass out.   °· You have blood in your urine. °· You have a fever or persistent symptoms for more than 2-3 days. °· You have a fever and your symptoms suddenly get worse. °MAKE SURE YOU:  °· Understand these instructions. °· Will watch your condition. °· Will get help right away if you are not doing well or get worse. °Document Released: 06/29/2005 Document Revised: 01/31/2012 Document Reviewed: 12/21/2011 °[employer]® Patient Information ©2015 [employer]. This information is not intended to replace advice given to you by your  health care provider. Make sure you discuss any questions you have with your health care provider. ° °

## 2013-12-16 LAB — URINE CULTURE: Colony Count: >=100000

## 2013-12-30 ENCOUNTER — Emergency Department: Payer: Self-pay | Admitting: Emergency Medicine

## 2013-12-30 LAB — URINALYSIS, COMPLETE
Bilirubin,UR: NEGATIVE
GLUCOSE, UR: NEGATIVE mg/dL (ref 0–75)
Ketone: NEGATIVE
Nitrite: NEGATIVE
Ph: 5 (ref 4.5–8.0)
Protein: 30
RBC,UR: 2156 /HPF (ref 0–5)
Specific Gravity: 1.029 (ref 1.003–1.030)
Squamous Epithelial: 4
WBC UR: 9 /HPF (ref 0–5)

## 2013-12-30 LAB — COMPREHENSIVE METABOLIC PANEL
Albumin: 4 g/dL (ref 3.4–5.0)
Alkaline Phosphatase: 111 U/L
Anion Gap: 8 (ref 7–16)
BUN: 14 mg/dL (ref 7–18)
Bilirubin,Total: 0.2 mg/dL (ref 0.2–1.0)
CHLORIDE: 102 mmol/L (ref 98–107)
Calcium, Total: 9.4 mg/dL (ref 8.5–10.1)
Co2: 28 mmol/L (ref 21–32)
Creatinine: 0.9 mg/dL (ref 0.60–1.30)
EGFR (African American): >60
EGFR (Non-African Amer.): >60
GLUCOSE: 121 mg/dL — AB (ref 65–99)
Osmolality: 277 (ref 275–301)
Potassium: 3.8 mmol/L (ref 3.5–5.1)
SGOT(AST): 72 U/L — ABNORMAL HIGH (ref 15–37)
SGPT (ALT): 106 U/L — ABNORMAL HIGH
Sodium: 138 mmol/L (ref 136–145)
Total Protein: 8.1 g/dL (ref 6.4–8.2)

## 2013-12-30 LAB — CBC
HCT: 39.9 % (ref 35.0–47.0)
HGB: 13.2 g/dL (ref 12.0–16.0)
MCH: 27.3 pg (ref 26.0–34.0)
MCHC: 32.9 g/dL (ref 32.0–36.0)
MCV: 83 fL (ref 80–100)
Platelet: 323 10*3/uL (ref 150–440)
RBC: 4.82 10*6/uL (ref 3.80–5.20)
RDW: 13.9 % (ref 11.5–14.5)
WBC: 10.2 10*3/uL (ref 3.6–11.0)

## 2014-01-07 ENCOUNTER — Emergency Department: Payer: Self-pay | Admitting: Emergency Medicine

## 2014-01-07 LAB — COMPREHENSIVE METABOLIC PANEL
ALK PHOS: 119 U/L — AB
ANION GAP: 11 (ref 7–16)
Albumin: 3.7 g/dL (ref 3.4–5.0)
BUN: 16 mg/dL (ref 7–18)
Bilirubin,Total: 0.2 mg/dL (ref 0.2–1.0)
CALCIUM: 9.1 mg/dL (ref 8.5–10.1)
CHLORIDE: 103 mmol/L (ref 98–107)
CO2: 26 mmol/L (ref 21–32)
CREATININE: 0.76 mg/dL (ref 0.60–1.30)
GLUCOSE: 97 mg/dL (ref 65–99)
Osmolality: 281 (ref 275–301)
POTASSIUM: 3.8 mmol/L (ref 3.5–5.1)
SGOT(AST): 68 U/L — ABNORMAL HIGH (ref 15–37)
SGPT (ALT): 82 U/L — ABNORMAL HIGH
Sodium: 140 mmol/L (ref 136–145)
TOTAL PROTEIN: 7.8 g/dL (ref 6.4–8.2)

## 2014-01-07 LAB — URINALYSIS, COMPLETE
Bilirubin,UR: NEGATIVE
Glucose,UR: NEGATIVE mg/dL (ref 0–75)
Ketone: NEGATIVE
NITRITE: NEGATIVE
Ph: 5 (ref 4.5–8.0)
RBC,UR: 2249 /HPF (ref 0–5)
Specific Gravity: 1.026 (ref 1.003–1.030)

## 2014-01-07 LAB — CBC
HCT: 40.8 % (ref 35.0–47.0)
HGB: 13.6 g/dL (ref 12.0–16.0)
MCH: 27.5 pg (ref 26.0–34.0)
MCHC: 33.3 g/dL (ref 32.0–36.0)
MCV: 83 fL (ref 80–100)
Platelet: 319 10*3/uL (ref 150–440)
RBC: 4.94 10*6/uL (ref 3.80–5.20)
RDW: 13.9 % (ref 11.5–14.5)
WBC: 13.4 10*3/uL — ABNORMAL HIGH (ref 3.6–11.0)

## 2014-03-18 ENCOUNTER — Emergency Department (HOSPITAL_COMMUNITY)
Admission: EM | Admit: 2014-03-18 | Discharge: 2014-03-18 | Disposition: A | Discharge status: Home / Self Care | Payer: BC Managed Care – PPO | Attending: Emergency Medicine | Admitting: Emergency Medicine

## 2014-03-18 ENCOUNTER — Encounter (HOSPITAL_COMMUNITY): Payer: Self-pay | Admitting: Emergency Medicine

## 2014-03-18 DIAGNOSIS — Z85528 Personal history of other malignant neoplasm of kidney: Secondary | ICD-10-CM | POA: Insufficient documentation

## 2014-03-18 DIAGNOSIS — N23 Unspecified renal colic: Secondary | ICD-10-CM

## 2014-03-18 DIAGNOSIS — Z85850 Personal history of malignant neoplasm of thyroid: Secondary | ICD-10-CM | POA: Insufficient documentation

## 2014-03-18 DIAGNOSIS — Z79899 Other long term (current) drug therapy: Secondary | ICD-10-CM | POA: Insufficient documentation

## 2014-03-18 DIAGNOSIS — E079 Disorder of thyroid, unspecified: Secondary | ICD-10-CM | POA: Insufficient documentation

## 2014-03-18 DIAGNOSIS — Z90710 Acquired absence of both cervix and uterus: Secondary | ICD-10-CM | POA: Insufficient documentation

## 2014-03-18 DIAGNOSIS — I10 Essential (primary) hypertension: Secondary | ICD-10-CM | POA: Insufficient documentation

## 2014-03-18 DIAGNOSIS — K219 Gastro-esophageal reflux disease without esophagitis: Secondary | ICD-10-CM | POA: Insufficient documentation

## 2014-03-18 DIAGNOSIS — Z9889 Other specified postprocedural states: Secondary | ICD-10-CM | POA: Insufficient documentation

## 2014-03-18 DIAGNOSIS — Z87442 Personal history of urinary calculi: Secondary | ICD-10-CM | POA: Insufficient documentation

## 2014-03-18 LAB — CBC WITH DIFFERENTIAL/PLATELET
Basophils Absolute: 0 10*3/uL (ref 0.0–0.1)
Basophils Relative: 0 % (ref 0–1)
Eosinophils Absolute: 0.2 10*3/uL (ref 0.0–0.7)
Eosinophils Relative: 2 % (ref 0–5)
HCT: 39.7 % (ref 36.0–46.0)
HEMOGLOBIN: 13.1 g/dL (ref 12.0–15.0)
LYMPHS ABS: 3.1 10*3/uL (ref 0.7–4.0)
LYMPHS PCT: 26 % (ref 12–46)
MCH: 27 pg (ref 26.0–34.0)
MCHC: 33 g/dL (ref 30.0–36.0)
MCV: 81.7 fL (ref 78.0–100.0)
Monocytes Absolute: 0.8 10*3/uL (ref 0.1–1.0)
Monocytes Relative: 6 % (ref 3–12)
NEUTROS PCT: 66 % (ref 43–77)
Neutro Abs: 7.9 10*3/uL — ABNORMAL HIGH (ref 1.7–7.7)
Platelets: 319 10*3/uL (ref 150–400)
RBC: 4.86 MIL/uL (ref 3.87–5.11)
RDW: 13.5 % (ref 11.5–15.5)
WBC: 12 10*3/uL — ABNORMAL HIGH (ref 4.0–10.5)

## 2014-03-18 LAB — COMPREHENSIVE METABOLIC PANEL
ALK PHOS: 108 U/L (ref 39–117)
ALT: 71 U/L — AB (ref 0–35)
AST: 53 U/L — ABNORMAL HIGH (ref 0–37)
Albumin: 4 g/dL (ref 3.5–5.2)
Anion gap: 13 (ref 5–15)
BUN: 15 mg/dL (ref 6–23)
CO2: 25 meq/L (ref 19–32)
Calcium: 9.2 mg/dL (ref 8.4–10.5)
Chloride: 99 mEq/L (ref 96–112)
Creatinine, Ser: 0.6 mg/dL (ref 0.50–1.10)
GFR calc non Af Amer: >90 mL/min (ref >90)
GLUCOSE: 104 mg/dL — AB (ref 70–99)
POTASSIUM: 3.9 meq/L (ref 3.7–5.3)
Sodium: 137 mEq/L (ref 137–147)
Total Protein: 8 g/dL (ref 6.0–8.3)

## 2014-03-18 LAB — URINALYSIS, ROUTINE W REFLEX MICROSCOPIC
Bilirubin Urine: NEGATIVE
GLUCOSE, UA: NEGATIVE mg/dL
KETONES UR: NEGATIVE mg/dL
Nitrite: NEGATIVE
Protein, ur: NEGATIVE mg/dL
Specific Gravity, Urine: 1.022 (ref 1.005–1.030)
UROBILINOGEN UA: 0.2 mg/dL (ref 0.0–1.0)
pH: 6.5 (ref 5.0–8.0)

## 2014-03-18 LAB — LIPASE, BLOOD: Lipase: 34 U/L (ref 11–59)

## 2014-03-18 LAB — URINE MICROSCOPIC-ADD ON

## 2014-03-18 MED ORDER — SODIUM CHLORIDE 0.9 % IV SOLN: 1000.0000 mL | INTRAVENOUS | Status: DC

## 2014-03-18 MED ORDER — HYDROMORPHONE HCL 1 MG/ML IJ SOLN
1.0000 mg | INTRAMUSCULAR | Status: DC | PRN
  Administered 2014-03-18 (×2): 1 mg via INTRAVENOUS
  Filled 2014-03-18 (×2): qty 1

## 2014-03-18 MED ORDER — ONDANSETRON HCL 4 MG/2ML IJ SOLN
4.0000 mg | Freq: Once | INTRAMUSCULAR | Status: AC
  Administered 2014-03-18: 4 mg via INTRAVENOUS
  Filled 2014-03-18: qty 2

## 2014-03-18 MED ORDER — SODIUM CHLORIDE 0.9 % IV SOLN
1000.0000 mL | Freq: Once | INTRAVENOUS | Status: AC
  Administered 2014-03-18: 1000 mL via INTRAVENOUS

## 2014-03-18 MED ORDER — CEPHALEXIN 500 MG PO CAPS: 500.0000 mg | ORAL_CAPSULE | Freq: Three times a day (TID) | ORAL | Status: DC

## 2014-03-18 MED ORDER — HYDROCODONE-ACETAMINOPHEN 5-325 MG PO TABS: 1.0000 | ORAL_TABLET | ORAL | Status: DC | PRN

## 2014-03-18 MED ORDER — ONDANSETRON 8 MG PO TBDP: 8.0000 mg | ORAL_TABLET | Freq: Three times a day (TID) | ORAL | Status: DC | PRN

## 2014-03-18 NOTE — Discharge Instructions (Signed)
Ureteral Colic Ureteral colic is spasm-like pain from the kidney or the ureter. This is often caused by a kidney stone. The pain is caused by the stone trying to get through the tubes that pass your pee. HOME CARE   Drink enough fluids to keep your pee (urine) clear or pale yellow.  Strain all your pee. A strainer will be provided. Keep anything caught in the strainer and bring it to your doctor. The stone causing the pain may be very small.  Only take medicine as told by your doctor.  Follow up with your doctor as told. GET HELP RIGHT AWAY IF:   Pain is not controlled with medicine.  Pain continues or gets worse.  The pain changes and there is chest or belly (abdominal) pain.  You pass out (faint).  You cannot pee.  You keep throwing up (vomiting).  You have a temperature by mouth above 102 F (38.9 C), not controlled by medicine. MAKE SURE YOU:   Understand these instructions.  Will watch this condition.  Will get help right away if you are not doing well or get worse. Document Released: 10/25/2007 Document Revised: 07/31/2011 Document Reviewed: 10/25/2007 [employer] Patient Information 2015 [employer]. This information is not intended to replace advice given to you by your health care provider. Make sure you discuss any questions you have with your health care provider.  

## 2014-03-18 NOTE — ED Notes (Signed)
Pt reports onset "discomfort" to R flank last night, went to sleep fine. Today around noon pt started having intermittent sharp pain to R flank. Hx of kidney stones and partial nephrectomy R side. Pt states felt "sandy" when she was urinating. Nausea, emesis x 1 today.

## 2014-03-18 NOTE — ED Provider Notes (Signed)
CSN: [id number]     Arrival date & time 03/18/14  1658 History   First MD Initiated Contact with Patient 03/18/14 1703     Chief Complaint  Patient presents with  . Flank Pain   HPI Patient presents to the emergency room with complaints of right-sided flank pain. symptoms started last night but resolved and she was able to go to bed. Around noon she started having increasing right sharp flank pain. The pain is intermittently severe. Pain does not radiate. She has noted some gritty sensation when she is urinating. She denies any diarrhea. No trouble with her appetite. She did have an episode of nausea and vomiting today when the pain was more severe. Patient does have a history of renal cell carcinoma as well as multiple kidney stones in the past. The symptoms do feel similar to prior kidney stone problems. Past Medical History  Diagnosis Date  . Renal disorder   . Thyroid disease   . Hypertension   . Polycystic ovarian syndrome   . GERD (gastroesophageal reflux disease)   . Kidney stones   . History of kidney cancer   . Kidney stones   . Cancer     renal  . Thyroid cancer    Past Surgical History  Procedure Laterality Date  . Cesarean section    . Tonsillectomy    . Abdominal hysterectomy    . Partial nephrectomy     Family History  Problem Relation Age of Onset  . Hypertension Mother   . Diabetes Mother   . Hypertension Father   . Cancer Father    History  Substance Use Topics  . Smoking status: Never Smoker   . Smokeless tobacco: Never Used  . Alcohol Use: No   OB History   Grav Para Term Preterm Abortions TAB SAB Ect Mult Living                 Review of Systems  All other systems reviewed and are negative.     Allergies  Demerol  Home Medications   Prior to Admission medications   Medication Sig Start Date End Date Taking? Authorizing Provider  hydrochlorothiazide (HYDRODIURIL) 25 MG tablet Take 25 mg by mouth daily.   Yes Historical Provider, MD   ibuprofen (ADVIL,MOTRIN) 200 MG tablet Take 600 mg by mouth every 6 (six) hours as needed for moderate pain.    Yes Historical Provider, MD  levothyroxine (SYNTHROID, LEVOTHROID) 200 MCG tablet Take 200 mcg by mouth daily before breakfast.   Yes Historical Provider, MD  metoprolol tartrate (LOPRESSOR) 25 MG tablet Take 25 mg by mouth 2 (two) times daily.   Yes Historical Provider, MD  Multiple Vitamin (MULTIVITAMIN WITH MINERALS) TABS tablet Take 1 tablet by mouth daily.   Yes Historical Provider, MD  Omega-3 Fatty Acids (FISH OIL PO) Take 1 tablet by mouth daily.   Yes Historical Provider, MD  omeprazole (PRILOSEC) 20 MG capsule Take 20 mg by mouth daily.   Yes Historical Provider, MD  POTASSIUM CHLORIDE PO Take 1 tablet by mouth daily.   Yes Historical Provider, MD  sertraline (ZOLOFT) 100 MG tablet Take 50 mg by mouth daily.    Yes Historical Provider, MD  VITAMIN E PO Take 1 tablet by mouth daily.   Yes Historical Provider, MD  cephALEXin (KEFLEX) 500 MG capsule Take 1 capsule (500 mg total) by mouth 3 (three) times daily. 03/18/14   Dorie Rank, MD  HYDROcodone-acetaminophen (NORCO/VICODIN) 5-325 MG per tablet Take 1-2 tablets  by mouth every 4 (four) hours as needed. 03/18/14   Dorie Rank, MD  ondansetron (ZOFRAN ODT) 8 MG disintegrating tablet Take 1 tablet (8 mg total) by mouth every 8 (eight) hours as needed for nausea or vomiting. 03/18/14   Dorie Rank, MD   BP 166/108  Pulse 111  Temp(Src) 98.3 F (36.8 C) (Oral)  Resp 22  Ht 5\' 10"  (1.778 m)  Wt 290 lb (131.543 kg)  BMI 41.61 kg/m2  SpO2 98% Physical Exam  Nursing note and vitals reviewed. Constitutional: She appears well-developed and well-nourished. No distress.  HENT:  Head: Normocephalic and atraumatic.  Right Ear: External ear normal.  Left Ear: External ear normal.  Eyes: Conjunctivae are normal. Right eye exhibits no discharge. Left eye exhibits no discharge. No scleral icterus.  Neck: Neck supple. No tracheal deviation  present.  Cardiovascular: Normal rate, regular rhythm and intact distal pulses.   Pulmonary/Chest: Effort normal and breath sounds normal. No stridor. No respiratory distress. She has no wheezes. She has no rales.  Abdominal: Soft. Bowel sounds are normal. She exhibits no distension. There is no tenderness. There is no rebound and no guarding.  Musculoskeletal: She exhibits no edema and no tenderness.  Neurological: She is alert. She has normal strength. No cranial nerve deficit (no facial droop, extraocular movements intact, no slurred speech) or sensory deficit. She exhibits normal muscle tone. She displays no seizure activity. Coordination normal.  Skin: Skin is warm and dry. No rash noted.  Psychiatric: She has a normal mood and affect.    ED Course  Procedures (including critical care time) Labs Review Labs Reviewed  CBC WITH DIFFERENTIAL - Abnormal; Notable for the following:    WBC 12.0 (*)    Neutro Abs 7.9 (*)    All other components within normal limits  COMPREHENSIVE METABOLIC PANEL - Abnormal; Notable for the following:    Glucose, Bld 104 (*)    AST 53 (*)    ALT 71 (*)    Total Bilirubin <0.2 (*)    All other components within normal limits  URINALYSIS, ROUTINE W REFLEX MICROSCOPIC - Abnormal; Notable for the following:    APPearance CLOUDY (*)    Hgb urine dipstick LARGE (*)    Leukocytes, UA SMALL (*)    All other components within normal limits  URINE CULTURE  LIPASE, BLOOD  URINE MICROSCOPIC-ADD ON    Medications  0.9 %  sodium chloride infusion (1,000 mLs Intravenous New Bag/Given 03/18/14 1735)    Followed by  0.9 %  sodium chloride infusion (not administered)  HYDROmorphone (DILAUDID) injection 1 mg (1 mg Intravenous Given 03/18/14 1833)  ondansetron (ZOFRAN) injection 4 mg (4 mg Intravenous Given 03/18/14 1736)  ondansetron (ZOFRAN) injection 4 mg (4 mg Intravenous Given 03/18/14 1829)     MDM   Final diagnoses:  Ureteral colic    Patient's  symptoms are more suggestive of ureteral colic as opposed to pyelonephritis. She does have white blood cells in her urine however and I will start her on oral antibiotics to cover for the possibility of infection. Patient's symptoms have been well controlled with treatment. Comfortable going home. We discussed expectant management and avoidance of CT scan at this time. However, if her symptoms do not improve this may be necessary.  Patient also understands to follow-up to make sure the urinalysis clears with treatment. She does have plans to see her urologist in the next couple of weeks   Dorie Rank, MD 03/18/14 2376

## 2014-03-19 LAB — URINE CULTURE: Colony Count: 50000

## 2014-04-07 ENCOUNTER — Emergency Department (HOSPITAL_COMMUNITY)
Admission: EM | Admit: 2014-04-07 | Discharge: 2014-04-08 | Disposition: A | Discharge status: Home / Self Care | Payer: Self-pay | Attending: Emergency Medicine | Admitting: Emergency Medicine

## 2014-04-07 ENCOUNTER — Emergency Department (HOSPITAL_COMMUNITY): Payer: BC Managed Care – PPO

## 2014-04-07 ENCOUNTER — Encounter (HOSPITAL_COMMUNITY): Payer: Self-pay | Admitting: Emergency Medicine

## 2014-04-07 DIAGNOSIS — N23 Unspecified renal colic: Secondary | ICD-10-CM | POA: Insufficient documentation

## 2014-04-07 DIAGNOSIS — Z85850 Personal history of malignant neoplasm of thyroid: Secondary | ICD-10-CM | POA: Insufficient documentation

## 2014-04-07 DIAGNOSIS — E039 Hypothyroidism, unspecified: Secondary | ICD-10-CM | POA: Insufficient documentation

## 2014-04-07 DIAGNOSIS — K219 Gastro-esophageal reflux disease without esophagitis: Secondary | ICD-10-CM | POA: Insufficient documentation

## 2014-04-07 DIAGNOSIS — I10 Essential (primary) hypertension: Secondary | ICD-10-CM | POA: Insufficient documentation

## 2014-04-07 DIAGNOSIS — Z85528 Personal history of other malignant neoplasm of kidney: Secondary | ICD-10-CM | POA: Insufficient documentation

## 2014-04-07 DIAGNOSIS — Z79899 Other long term (current) drug therapy: Secondary | ICD-10-CM | POA: Insufficient documentation

## 2014-04-07 DIAGNOSIS — Z87442 Personal history of urinary calculi: Secondary | ICD-10-CM

## 2014-04-07 DIAGNOSIS — Z792 Long term (current) use of antibiotics: Secondary | ICD-10-CM | POA: Insufficient documentation

## 2014-04-07 DIAGNOSIS — N200 Calculus of kidney: Secondary | ICD-10-CM | POA: Insufficient documentation

## 2014-04-07 LAB — URINALYSIS, ROUTINE W REFLEX MICROSCOPIC
Bilirubin Urine: NEGATIVE
Glucose, UA: NEGATIVE mg/dL
KETONES UR: NEGATIVE mg/dL
NITRITE: NEGATIVE
PH: 6.5 (ref 5.0–8.0)
Protein, ur: NEGATIVE mg/dL
Specific Gravity, Urine: 1.026 (ref 1.005–1.030)
Urobilinogen, UA: 0.2 mg/dL (ref 0.0–1.0)

## 2014-04-07 LAB — COMPREHENSIVE METABOLIC PANEL
ALBUMIN: 4 g/dL (ref 3.5–5.2)
ALT: 44 U/L — ABNORMAL HIGH (ref 0–35)
AST: 36 U/L (ref 0–37)
Alkaline Phosphatase: 103 U/L (ref 39–117)
Anion gap: 13 (ref 5–15)
BUN: 13 mg/dL (ref 6–23)
CALCIUM: 9.5 mg/dL (ref 8.4–10.5)
CO2: 25 mEq/L (ref 19–32)
CREATININE: 0.61 mg/dL (ref 0.50–1.10)
Chloride: 98 mEq/L (ref 96–112)
GFR calc Af Amer: >90 mL/min (ref >90)
GFR calc non Af Amer: >90 mL/min (ref >90)
Glucose, Bld: 116 mg/dL — ABNORMAL HIGH (ref 70–99)
Potassium: 4.2 mEq/L (ref 3.7–5.3)
Sodium: 136 mEq/L — ABNORMAL LOW (ref 137–147)
TOTAL PROTEIN: 8 g/dL (ref 6.0–8.3)
Total Bilirubin: <0.2 mg/dL — ABNORMAL LOW (ref 0.3–1.2)

## 2014-04-07 LAB — LIPASE, BLOOD: LIPASE: 28 U/L (ref 11–59)

## 2014-04-07 LAB — CBC WITH DIFFERENTIAL/PLATELET
BASOS PCT: 0 % (ref 0–1)
Basophils Absolute: 0 10*3/uL (ref 0.0–0.1)
EOS ABS: 0.3 10*3/uL (ref 0.0–0.7)
EOS PCT: 2 % (ref 0–5)
HCT: 38.6 % (ref 36.0–46.0)
Hemoglobin: 12.5 g/dL (ref 12.0–15.0)
Lymphocytes Relative: 24 % (ref 12–46)
Lymphs Abs: 2.8 10*3/uL (ref 0.7–4.0)
MCH: 26.6 pg (ref 26.0–34.0)
MCHC: 32.4 g/dL (ref 30.0–36.0)
MCV: 82.1 fL (ref 78.0–100.0)
MONOS PCT: 8 % (ref 3–12)
Monocytes Absolute: 0.9 10*3/uL (ref 0.1–1.0)
NEUTROS PCT: 66 % (ref 43–77)
Neutro Abs: 7.7 10*3/uL (ref 1.7–7.7)
PLATELETS: 318 10*3/uL (ref 150–400)
RBC: 4.7 MIL/uL (ref 3.87–5.11)
RDW: 13.5 % (ref 11.5–15.5)
WBC: 11.6 10*3/uL — ABNORMAL HIGH (ref 4.0–10.5)

## 2014-04-07 LAB — URINE MICROSCOPIC-ADD ON

## 2014-04-07 MED ORDER — ONDANSETRON 4 MG PO TBDP: 4.0000 mg | ORAL_TABLET | Freq: Once | ORAL | Status: DC

## 2014-04-07 MED ORDER — MORPHINE SULFATE 4 MG/ML IJ SOLN
4.0000 mg | Freq: Once | INTRAMUSCULAR | Status: AC
  Administered 2014-04-07: 4 mg via INTRAVENOUS
  Filled 2014-04-07: qty 1

## 2014-04-07 MED ORDER — DIPHENHYDRAMINE HCL 50 MG/ML IJ SOLN
25.0000 mg | Freq: Once | INTRAMUSCULAR | Status: AC
  Administered 2014-04-07: 25 mg via INTRAVENOUS
  Filled 2014-04-07: qty 1

## 2014-04-07 MED ORDER — HYDROMORPHONE HCL 1 MG/ML IJ SOLN
1.0000 mg | Freq: Once | INTRAMUSCULAR | Status: AC
  Administered 2014-04-07: 1 mg via INTRAVENOUS
  Filled 2014-04-07: qty 1

## 2014-04-07 MED ORDER — ONDANSETRON 8 MG PO TBDP
8.0000 mg | ORAL_TABLET | Freq: Once | ORAL | Status: AC
  Administered 2014-04-07: 8 mg via ORAL
  Filled 2014-04-07: qty 1

## 2014-04-07 NOTE — ED Provider Notes (Signed)
CSN: [id number]     Arrival date & time 04/07/14  1832 History   First MD Initiated Contact with Patient 04/07/14 2004     Chief Complaint  Patient presents with  . Flank Pain     (Consider location/radiation/quality/duration/timing/severity/associated sxs/prior Treatment) HPI  Yolanda Matthews is a 43 y.o. female with PMH of kidneys stones, renal cell carcinoma presenting with sharp right sided flank pain that radiates into the groin that started today. Pain is intermittent.  Patient noticed some blood in her urine and noted a gritty sensation after she urinated. Patient will sign complaint 1 month ago and she presented to the ED with unremarkable ultrasound. Patient given pain meds and Flomax with improvement of her pain. She also notes she believes she passed a kidney stone one week ago. Patient denies fever, she endorses chills. She feels nauseated but no emesis. no other abdominal pain. No urinary symptoms other than hematuria and no vaginal complaints. Patient states pain feels like prior kidney stones. Pt with follow up with urology 04/13/14.    Past Medical History  Diagnosis Date  . Renal disorder   . Thyroid disease   . Hypertension   . Polycystic ovarian syndrome   . GERD (gastroesophageal reflux disease)   . Kidney stones   . History of kidney cancer   . Kidney stones   . Cancer     renal  . Thyroid cancer    Past Surgical History  Procedure Laterality Date  . Cesarean section    . Tonsillectomy    . Abdominal hysterectomy    . Partial nephrectomy     Family History  Problem Relation Age of Onset  . Hypertension Mother   . Diabetes Mother   . Hypertension Father   . Cancer Father    History  Substance Use Topics  . Smoking status: Never Smoker   . Smokeless tobacco: Never Used  . Alcohol Use: No   OB History    No data available     Review of Systems  Constitutional: Negative for fever and chills.  HENT: Negative for congestion and rhinorrhea.   Eyes:  Negative for visual disturbance.  Respiratory: Negative for cough and shortness of breath.   Cardiovascular: Negative for chest pain and palpitations.  Gastrointestinal: Positive for nausea. Negative for vomiting and diarrhea.  Genitourinary: Positive for flank pain. Negative for dysuria and hematuria.  Musculoskeletal: Negative for gait problem.  Skin: Negative for rash.  Neurological: Negative for weakness and headaches.      Allergies  Demerol  Home Medications   Prior to Admission medications   Medication Sig Start Date End Date Taking? Authorizing Provider  acetaminophen (TYLENOL) 500 MG tablet Take 1,000 mg by mouth every 6 (six) hours as needed for moderate pain (pain).   Yes Historical Provider, MD  furosemide (LASIX) 40 MG tablet Take 40 mg by mouth daily.   Yes Historical Provider, MD  hydrochlorothiazide (HYDRODIURIL) 25 MG tablet Take 25 mg by mouth daily.   Yes Historical Provider, MD  ibuprofen (ADVIL,MOTRIN) 200 MG tablet Take 600 mg by mouth every 6 (six) hours as needed for moderate pain (pain).    Yes Historical Provider, MD  levothyroxine (SYNTHROID, LEVOTHROID) 200 MCG tablet Take 200 mcg by mouth daily before breakfast.   Yes Historical Provider, MD  metoprolol tartrate (LOPRESSOR) 25 MG tablet Take 25 mg by mouth 2 (two) times daily.   Yes Historical Provider, MD  Multiple Vitamin (MULTIVITAMIN WITH MINERALS) TABS tablet Take 1 tablet  by mouth daily.   Yes Historical Provider, MD  Omega-3 Fatty Acids (FISH OIL PO) Take 1 tablet by mouth daily.   Yes Historical Provider, MD  omeprazole (PRILOSEC) 20 MG capsule Take 20 mg by mouth daily.   Yes Historical Provider, MD  POTASSIUM CHLORIDE PO Take 1 tablet by mouth daily.   Yes Historical Provider, MD  sertraline (ZOLOFT) 100 MG tablet Take 50 mg by mouth daily.    Yes Historical Provider, MD  VITAMIN E PO Take 1 tablet by mouth daily.   Yes Historical Provider, MD  cephALEXin (KEFLEX) 500 MG capsule Take 1 capsule  (500 mg total) by mouth 3 (three) times daily. 03/18/14   Dorie Rank, MD  HYDROcodone-acetaminophen (NORCO/VICODIN) 5-325 MG per tablet Take 1-2 tablets by mouth every 4 (four) hours as needed. 03/18/14   Dorie Rank, MD  HYDROcodone-acetaminophen (NORCO/VICODIN) 5-325 MG per tablet Take 2 tablets by mouth every 4 (four) hours as needed. 04/08/14   Pura Spice, PA-C  ondansetron (ZOFRAN ODT) 8 MG disintegrating tablet Take 1 tablet (8 mg total) by mouth every 8 (eight) hours as needed for nausea or vomiting. 03/18/14   Dorie Rank, MD  ondansetron (ZOFRAN) 4 MG tablet Take 1 tablet (4 mg total) by mouth every 6 (six) hours. 04/08/14   Pura Spice, PA-C  tamsulosin (FLOMAX) 0.4 MG CAPS capsule Take 1 capsule (0.4 mg total) by mouth daily after supper. 04/08/14   [place], PA-C   BP 156/103 mmHg  Pulse 109  Temp(Src) 98.4 F (36.9 C) (Oral)  Resp 18  Ht 5\' 10"  (1.778 m)  Wt 270 lb (122.471 kg)  BMI 38.74 kg/m2  SpO2 94% Physical Exam  Constitutional: She appears well-developed and well-nourished. No distress.  HENT:  Head: Normocephalic and atraumatic.  Eyes: Conjunctivae and EOM are normal. Right eye exhibits no discharge. Left eye exhibits no discharge.  Cardiovascular: Normal rate, regular rhythm and normal heart sounds.   Pulmonary/Chest: Effort normal and breath sounds normal. No respiratory distress. She has no wheezes.  Abdominal: Soft. Bowel sounds are normal. She exhibits no distension. There is no tenderness.  Right CVA tenderness.  Neurological: She is alert. She exhibits normal muscle tone. Coordination normal.  Skin: Skin is warm and dry. She is not diaphoretic.  Nursing note and vitals reviewed.   ED Course  Procedures (including critical care time) Labs Review Labs Reviewed  CBC WITH DIFFERENTIAL - Abnormal; Notable for the following:    WBC 11.6 (*)    All other components within normal limits  COMPREHENSIVE METABOLIC PANEL - Abnormal; Notable for  the following:    Sodium 136 (*)    Glucose, Bld 116 (*)    ALT 44 (*)    Total Bilirubin <0.2 (*)    All other components within normal limits  URINALYSIS, ROUTINE W REFLEX MICROSCOPIC - Abnormal; Notable for the following:    APPearance TURBID (*)    Hgb urine dipstick LARGE (*)    Leukocytes, UA SMALL (*)    All other components within normal limits  URINE MICROSCOPIC-ADD ON - Abnormal; Notable for the following:    Squamous Epithelial / LPF FEW (*)    Bacteria, UA FEW (*)    All other components within normal limits  LIPASE, BLOOD    Imaging Review US Renal  04/07/2014   CLINICAL DATA:  Right flank pain  EXAM: RENAL/URINARY TRACT ULTRASOUND COMPLETE  COMPARISON:  12/04/2013  FINDINGS: Right Kidney:  Length: 13.7 cm. Calcification  is noted measuring 5 mm in the lower pole of the right kidney consistent with a nonobstructing stone.  Left Kidney:  Length: 14.9 cm. Echogenicity within normal limits. No mass or hydronephrosis visualized.  Bladder:  Appears normal for degree of bladder distention. Bilateral ureteral jets are noted.  IMPRESSION: Nonobstructing right renal stone of the lower pole. No other focal abnormality is seen.   Electronically Signed   By: Inez Catalina M.D.   On: 04/07/2014 23:14     EKG Interpretation None      Meds given in ED:  Medications  ondansetron (ZOFRAN-ODT) disintegrating tablet 8 mg (8 mg Oral Given 04/07/14 1942)  morphine 4 MG/ML injection 4 mg (4 mg Intravenous Given 04/07/14 2116)  morphine 4 MG/ML injection 4 mg (4 mg Intravenous Given 04/07/14 2238)  diphenhydrAMINE (BENADRYL) injection 25 mg (25 mg Intravenous Given 04/07/14 2237)  HYDROmorphone (DILAUDID) injection 1 mg (1 mg Intravenous Given 04/07/14 2326)    Discharge Medication List as of 04/08/2014 12:10 AM    START taking these medications   Details  !! HYDROcodone-acetaminophen (NORCO/VICODIN) 5-325 MG per tablet Take 2 tablets by mouth every 4 (four) hours as needed., Starting  04/08/2014, Until Discontinued, Print    ondansetron (ZOFRAN) 4 MG tablet Take 1 tablet (4 mg total) by mouth every 6 (six) hours., Starting 04/08/2014, Until Discontinued, Print    tamsulosin (FLOMAX) 0.4 MG CAPS capsule Take 1 capsule (0.4 mg total) by mouth daily after supper., Starting 04/08/2014, Until Discontinued, Print     !! - Potential duplicate medications found. Please discuss with provider.        MDM   Final diagnoses:  History of nephrolithiasis  Nephrolithiasis  Ureteral colic   Pt with history of nephrolithiasis and renal cell carcinoma presenting with right flank pain with hematuria. Pain is similar to prior episodes of kidney stones. There is no evidence of  Hydronephrosis on [place], serum creatine WNL and the pt does not have intractable vomiting. She is afebrile. No urinary symptoms and urine with TNTC RBC and 3-6WBC, few bacteria and few squamous. Likely contaminant. I doubt pyelonephritis. Pt pain managed in ED. Pt wants to go home. Pt will be dc home with pain medications and flomax. Driving and sedation precautions provided.  Pt to follow up with urology 04/13/14.   Discussed return precautions with patient. Discussed all results and patient verbalizes understanding and agrees with plan.  Case has been discussed with Dr. Tawnya Crook who agrees with the above plan and to discharge.       Pura Spice, PA-C 04/08/14 [place], MD 04/09/14 [phone]

## 2014-04-07 NOTE — ED Notes (Signed)
Pt states that she has a hx of kidney stones and renal cell carcinoma. Pt is c/o rt flank pain that radiates to her rt lower ABD. Pt also states she has had nausea and hematuria but denies vomiting or burning with urination.Marland Kitchen

## 2014-04-08 MED ORDER — HYDROCODONE-ACETAMINOPHEN 5-325 MG PO TABS: 2.0000 | ORAL_TABLET | ORAL | Status: DC | PRN

## 2014-04-08 MED ORDER — TAMSULOSIN HCL 0.4 MG PO CAPS: 0.4000 mg | ORAL_CAPSULE | Freq: Every day | ORAL | Status: DC

## 2014-04-08 MED ORDER — ONDANSETRON HCL 4 MG PO TABS: 4.0000 mg | ORAL_TABLET | Freq: Four times a day (QID) | ORAL | Status: DC

## 2014-04-08 NOTE — Discharge Instructions (Signed)
Return to the emergency room with worsening of symptoms, new symptoms or with symptoms that are concerning, fevers, chills, nausea, vomiting, unable to tolerate fluids, unable to urinate, abdominal pain.  Follow up with Urology 04/13/14 as scheduled.  Dietary Guidelines to Help Prevent Kidney Stones Your risk of kidney stones can be decreased by adjusting the foods you eat. The most important thing you can do is drink enough fluid. You should drink enough fluid to keep your urine clear or pale yellow. The following guidelines provide specific information for the type of kidney stone you have had. GUIDELINES ACCORDING TO TYPE OF KIDNEY STONE Calcium Oxalate Kidney Stones  Reduce the amount of salt you eat. Foods that have a lot of salt cause your body to release excess calcium into your urine. The excess calcium can combine with a substance called oxalate to form kidney stones.  Reduce the amount of animal protein you eat if the amount you eat is excessive. Animal protein causes your body to release excess calcium into your urine. Ask your dietitian how much protein from animal sources you should be eating.  Avoid foods that are high in oxalates. If you take vitamins, they should have less than 500 mg of vitamin C. Your body turns vitamin C into oxalates. You do not need to avoid fruits and vegetables high in vitamin C. Calcium Phosphate Kidney Stones  Reduce the amount of salt you eat to help prevent the release of excess calcium into your urine.  Reduce the amount of animal protein you eat if the amount you eat is excessive. Animal protein causes your body to release excess calcium into your urine. Ask your dietitian how much protein from animal sources you should be eating.  Get enough calcium from food or take a calcium supplement (ask your dietitian for recommendations). Food sources of calcium that do not increase your risk of kidney stones include:  Broccoli.  Dairy products, such as  cheese and yogurt.  Pudding. Uric Acid Kidney Stones  Do not have more than 6 oz of animal protein per day. FOOD SOURCES Animal Protein Sources  Meat (all types).  Poultry.  Eggs.  Fish, seafood. Foods High in [employer] seasonings.  Soy sauce.  Teriyaki sauce.  Cured and processed meats.  Salted crackers and snack foods.  Fast food.  Canned soups and most canned foods. Foods High in Oxalates  Grains:  Amaranth.  Barley.  Grits.  Wheat germ.  Bran.  Buckwheat flour.  All bran cereals.  Pretzels.  Whole wheat bread.  Vegetables:  Beans (wax).  Beets and beet greens.  Collard greens.  Eggplant.  [place].  Leeks.  Okra.  Parsley.  R[place]abagas.  Spinach.  Swiss chard.  Tomato paste.  Fried potatoes.  Sweet potatoes.  Fruits:  Red currants.  Figs.  Kiwi.  Rhubarb.  Meat and Other Protein Sources:  Beans (dried).  Soy burgers and other soybean products.  Miso.  Nuts (peanuts, almonds, pecans, cashews, hazelnuts).  Nut butters.  Sesame seeds and tahini (paste made of sesame seeds).  Poppy seeds.  Beverages:  Chocolate drink mixes.  Soy milk.  Instant iced tea.  Juices made from high-oxalate fruits or vegetables.  Other:  Carob.  Chocolate.  Fruitcake.  Marmalades. Document Released: 09/02/2010 Document Revised: 05/13/2013 Document Reviewed: 04/04/2013 [facility] Patient Information 2015 [place], [place]. This information is not intended to replace advice given to you by your health care provider. Make sure you discuss any questions you have with your health  care provider.   Kidney Stones Kidney stones (urolithiasis) are deposits that form inside your kidneys. The intense pain is caused by the stone moving through the urinary tract. When the stone moves, the ureter goes into spasm around the stone. The stone is usually passed in the urine.  CAUSES   A disorder that makes certain neck glands  produce too much parathyroid hormone (primary hyperparathyroidism).  A buildup of uric acid crystals, similar to gout in your joints.  Narrowing (stricture) of the ureter.  A kidney obstruction present at birth (congenital obstruction).  Previous surgery on the kidney or ureters.  Numerous kidney infections. SYMPTOMS   Feeling sick to your stomach (nauseous).  Throwing up (vomiting).  Blood in the urine (hematuria).  Pain that usually spreads (radiates) to the groin.  Frequency or urgency of urination. DIAGNOSIS   Taking a history and physical exam.  Blood or urine tests.  CT scan.  Occasionally, an examination of the inside of the urinary bladder (cystoscopy) is performed. TREATMENT   Observation.  Increasing your fluid intake.  Extracorporeal shock wave lithotripsy--This is a noninvasive procedure that uses shock waves to break up kidney stones.  Surgery may be needed if you have severe pain or persistent obstruction. There are various surgical procedures. Most of the procedures are performed with the use of small instruments. Only small incisions are needed to accommodate these instruments, so recovery time is minimized. The size, location, and chemical composition are all important variables that will determine the proper choice of action for you. Talk to your health care provider to better understand your situation so that you will minimize the risk of injury to yourself and your kidney.  HOME CARE INSTRUCTIONS   Drink enough water and fluids to keep your urine clear or pale yellow. This will help you to pass the stone or stone fragments.  Strain all urine through the provided strainer. Keep all particulate matter and stones for your health care provider to see. The stone causing the pain may be as small as a grain of salt. It is very important to use the strainer each and every time you pass your urine. The collection of your stone will allow your health care  provider to analyze it and verify that a stone has actually passed. The stone analysis will often identify what you can do to reduce the incidence of recurrences.  Only take over-the-counter or prescription medicines for pain, discomfort, or fever as directed by your health care provider.  Make a follow-up appointment with your health care provider as directed.  Get follow-up X-rays if required. The absence of pain does not always mean that the stone has passed. It may have only stopped moving. If the urine remains completely obstructed, it can cause loss of kidney function or even complete destruction of the kidney. It is your responsibility to make sure X-rays and follow-ups are completed. Ultrasounds of the kidney can show blockages and the status of the kidney. Ultrasounds are not associated with any radiation and can be performed easily in a matter of minutes. SEEK MEDICAL CARE IF:  You experience pain that is progressive and unresponsive to any pain medicine you have been prescribed. SEEK IMMEDIATE MEDICAL CARE IF:   Pain cannot be controlled with the prescribed medicine.  You have a fever or shaking chills.  The severity or intensity of pain increases over 18 hours and is not relieved by pain medicine.  You develop a new onset of abdominal pain.  You  feel faint or pass out.  You are unable to urinate. MAKE SURE YOU:   Understand these instructions.  Will watch your condition.  Will get help right away if you are not doing well or get worse. Document Released: 05/08/2005 Document Revised: 01/08/2013 Document Reviewed: 10/09/2012 [facility] Patient Information 2015 [place], [place]. This information is not intended to replace advice given to you by your health care provider. Make sure you discuss any questions you have with your health care provider.

## 2014-06-26 ENCOUNTER — Encounter (HOSPITAL_COMMUNITY): Payer: Self-pay | Admitting: Emergency Medicine

## 2014-06-26 ENCOUNTER — Emergency Department (HOSPITAL_COMMUNITY): Payer: 59

## 2014-06-26 ENCOUNTER — Emergency Department (HOSPITAL_COMMUNITY)
Admission: EM | Admit: 2014-06-26 | Discharge: 2014-06-26 | Disposition: A | Discharge status: Home / Self Care | Payer: Self-pay | Attending: Emergency Medicine | Admitting: Emergency Medicine

## 2014-06-26 DIAGNOSIS — R109 Unspecified abdominal pain: Secondary | ICD-10-CM

## 2014-06-26 DIAGNOSIS — Z85850 Personal history of malignant neoplasm of thyroid: Secondary | ICD-10-CM | POA: Insufficient documentation

## 2014-06-26 DIAGNOSIS — Z87442 Personal history of urinary calculi: Secondary | ICD-10-CM | POA: Insufficient documentation

## 2014-06-26 DIAGNOSIS — N390 Urinary tract infection, site not specified: Secondary | ICD-10-CM | POA: Insufficient documentation

## 2014-06-26 DIAGNOSIS — Z79899 Other long term (current) drug therapy: Secondary | ICD-10-CM | POA: Insufficient documentation

## 2014-06-26 DIAGNOSIS — E079 Disorder of thyroid, unspecified: Secondary | ICD-10-CM | POA: Insufficient documentation

## 2014-06-26 DIAGNOSIS — K219 Gastro-esophageal reflux disease without esophagitis: Secondary | ICD-10-CM | POA: Insufficient documentation

## 2014-06-26 DIAGNOSIS — Z85528 Personal history of other malignant neoplasm of kidney: Secondary | ICD-10-CM | POA: Insufficient documentation

## 2014-06-26 DIAGNOSIS — Z905 Acquired absence of kidney: Secondary | ICD-10-CM | POA: Insufficient documentation

## 2014-06-26 LAB — CBC WITH DIFFERENTIAL/PLATELET
Basophils Absolute: 0 10*3/uL (ref 0.0–0.1)
Basophils Relative: 0 % (ref 0–1)
EOS ABS: 0.3 10*3/uL (ref 0.0–0.7)
Eosinophils Relative: 3 % (ref 0–5)
HEMATOCRIT: 39.2 % (ref 36.0–46.0)
HEMOGLOBIN: 12.7 g/dL (ref 12.0–15.0)
LYMPHS PCT: 27 % (ref 12–46)
Lymphs Abs: 2.8 10*3/uL (ref 0.7–4.0)
MCH: 26.4 pg (ref 26.0–34.0)
MCHC: 32.4 g/dL (ref 30.0–36.0)
MCV: 81.5 fL (ref 78.0–100.0)
MONO ABS: 0.7 10*3/uL (ref 0.1–1.0)
Monocytes Relative: 7 % (ref 3–12)
NEUTROS ABS: 6.4 10*3/uL (ref 1.7–7.7)
Neutrophils Relative %: 63 % (ref 43–77)
PLATELETS: 333 10*3/uL (ref 150–400)
RBC: 4.81 MIL/uL (ref 3.87–5.11)
RDW: 13.7 % (ref 11.5–15.5)
WBC: 10.2 10*3/uL (ref 4.0–10.5)

## 2014-06-26 LAB — URINALYSIS, ROUTINE W REFLEX MICROSCOPIC
Bilirubin Urine: NEGATIVE
Glucose, UA: NEGATIVE mg/dL
Ketones, ur: NEGATIVE mg/dL
Nitrite: NEGATIVE
Protein, ur: NEGATIVE mg/dL
SPECIFIC GRAVITY, URINE: 1.025 (ref 1.005–1.030)
Urobilinogen, UA: 0.2 mg/dL (ref 0.0–1.0)
pH: 6 (ref 5.0–8.0)

## 2014-06-26 LAB — COMPREHENSIVE METABOLIC PANEL
ALBUMIN: 4.2 g/dL (ref 3.5–5.2)
ALT: 31 U/L (ref 0–35)
AST: 30 U/L (ref 0–37)
Alkaline Phosphatase: 96 U/L (ref 39–117)
Anion gap: 7 (ref 5–15)
BUN: 12 mg/dL (ref 6–23)
CALCIUM: 8.9 mg/dL (ref 8.4–10.5)
CO2: 27 mmol/L (ref 19–32)
CREATININE: 0.51 mg/dL (ref 0.50–1.10)
Chloride: 104 mmol/L (ref 96–112)
GFR calc Af Amer: >90 mL/min (ref >90)
GFR calc non Af Amer: >90 mL/min (ref >90)
Glucose, Bld: 90 mg/dL (ref 70–99)
Potassium: 3.8 mmol/L (ref 3.5–5.1)
Sodium: 138 mmol/L (ref 135–145)
Total Bilirubin: 0.3 mg/dL (ref 0.3–1.2)
Total Protein: 7.4 g/dL (ref 6.0–8.3)

## 2014-06-26 LAB — URINE MICROSCOPIC-ADD ON

## 2014-06-26 LAB — LIPASE, BLOOD: Lipase: 24 U/L (ref 11–59)

## 2014-06-26 MED ORDER — CEPHALEXIN 500 MG PO CAPS: 500.0000 mg | ORAL_CAPSULE | Freq: Four times a day (QID) | ORAL | Status: DC

## 2014-06-26 MED ORDER — OXYCODONE-ACETAMINOPHEN 5-325 MG PO TABS
2.0000 | ORAL_TABLET | Freq: Once | ORAL | Status: AC
  Administered 2014-06-26: 2 via ORAL
  Filled 2014-06-26: qty 2

## 2014-06-26 MED ORDER — ONDANSETRON 8 MG PO TBDP: 8.0000 mg | ORAL_TABLET | Freq: Three times a day (TID) | ORAL | Status: DC | PRN

## 2014-06-26 MED ORDER — ONDANSETRON 4 MG PO TBDP
4.0000 mg | ORAL_TABLET | Freq: Once | ORAL | Status: AC
  Administered 2014-06-26: 4 mg via ORAL
  Filled 2014-06-26: qty 1

## 2014-06-26 MED ORDER — OXYCODONE-ACETAMINOPHEN 5-325 MG PO TABS: 2.0000 | ORAL_TABLET | ORAL | Status: DC | PRN

## 2014-06-26 NOTE — ED Notes (Signed)
Pt c/o sharp intermittent LLQ pain x 3 hours, nausea, denies emesis, denies diarrhea.

## 2014-06-26 NOTE — ED Provider Notes (Signed)
CSN: [id number]     Arrival date & time 06/26/14  1621 History   First MD Initiated Contact with Patient 06/26/14 1716     Chief Complaint  Patient presents with  . Abdominal Pain     (Consider location/radiation/quality/duration/timing/severity/associated sxs/prior Treatment) HPI Yolanda Matthews is a 44 y.o. female with a history of recurrent nephrolithiasis, partial right nephrectomy in 2011 comes in for evaluation of left-sided abdominal discomfort. Patient states approximately 3 hours ago she began to experience a "sharp and dull" sensation in her left groin and abdomen. She says the pain comes and goes like her kidney stone pain. She took ibuprofen at home which offered some relief. She rates her discomfort as a 6/10 right now. She denies any overt urinary symptoms, but does report there may have been some blood in her urine when she gave a sample in the ED. She does report associated nausea without emesis. She denies fevers, other abdominal pain, diarrhea or constipation, vaginal bleeding or discharge, numbness or weakness, syncope, rash. Patient is established with Urology at [facility].  Past Medical History  Diagnosis Date  . Renal disorder   . Thyroid disease   . Hypertension   . Polycystic ovarian syndrome   . GERD (gastroesophageal reflux disease)   . Kidney stones   . History of kidney cancer   . Kidney stones   . Cancer     renal  . Thyroid cancer    Past Surgical History  Procedure Laterality Date  . Cesarean section    . Tonsillectomy    . Abdominal hysterectomy    . Partial nephrectomy     Family History  Problem Relation Age of Onset  . Hypertension Mother   . Diabetes Mother   . Hypertension Father   . Cancer Father    History  Substance Use Topics  . Smoking status: Never Smoker   . Smokeless tobacco: Never Used  . Alcohol Use: No   OB History    No data available     Review of Systems  All other systems reviewed and are negative.  A 10 point review  of systems was completed and was negative except for pertinent positives and negatives as mentioned in the history of present illness     Allergies  Demerol  Home Medications   Prior to Admission medications   Medication Sig Start Date End Date Taking? Authorizing Provider  acetaminophen (TYLENOL) 500 MG tablet Take 1,000 mg by mouth every 6 (six) hours as needed for moderate pain (pain).   Yes Historical Provider, MD  amLODipine (NORVASC) 5 MG tablet Take 5 mg by mouth daily.   Yes Historical Provider, MD  Cholecalciferol (VITAMIN D PO) Take 1 tablet by mouth daily.   Yes Historical Provider, MD  furosemide (LASIX) 40 MG tablet Take 40 mg by mouth daily.   Yes Historical Provider, MD  ibuprofen (ADVIL,MOTRIN) 200 MG tablet Take 600 mg by mouth every 6 (six) hours as needed for moderate pain (pain).    Yes Historical Provider, MD  levothyroxine (SYNTHROID, LEVOTHROID) 200 MCG tablet Take 200 mcg by mouth daily before breakfast.   Yes Historical Provider, MD  metoprolol tartrate (LOPRESSOR) 25 MG tablet Take 25 mg by mouth 2 (two) times daily.   Yes Historical Provider, MD  Multiple Vitamin (MULTIVITAMIN WITH MINERALS) TABS tablet Take 1 tablet by mouth daily.   Yes Historical Provider, MD  Omega-3 Fatty Acids (FISH OIL PO) Take 1 tablet by mouth daily.   Yes Historical  Provider, MD  omeprazole (PRILOSEC) 20 MG capsule Take 20 mg by mouth daily.   Yes Historical Provider, MD  POTASSIUM CHLORIDE PO Take 1 tablet by mouth daily.   Yes Historical Provider, MD  sertraline (ZOLOFT) 100 MG tablet Take 50 mg by mouth daily.    Yes Historical Provider, MD  VITAMIN E PO Take 1 tablet by mouth daily.   Yes Historical Provider, MD  cephALEXin (KEFLEX) 500 MG capsule Take 1 capsule (500 mg total) by mouth 4 (four) times daily. 06/26/14   Viona Gilmore Kenyatta Gloeckner, PA-C  hydrochlorothiazide (HYDRODIURIL) 25 MG tablet Take 25 mg by mouth daily.    Historical Provider, MD  HYDROcodone-acetaminophen (NORCO/VICODIN)  5-325 MG per tablet Take 1-2 tablets by mouth every 4 (four) hours as needed. Patient not taking: Reported on 04/09/2014 03/18/14   Dorie Rank, MD  HYDROcodone-acetaminophen (NORCO/VICODIN) 5-325 MG per tablet Take 2 tablets by mouth every 4 (four) hours as needed. Patient not taking: Reported on 06/26/2014 04/08/14   Pura Spice, PA-C  ondansetron (ZOFRAN ODT) 8 MG disintegrating tablet Take 1 tablet (8 mg total) by mouth every 8 (eight) hours as needed for nausea or vomiting. 06/26/14   E[place]orado, PA-C  ondansetron (ZOFRAN) 4 MG tablet Take 1 tablet (4 mg total) by mouth every 6 (six) hours. Patient not taking: Reported on 06/26/2014 04/08/14   Pura Spice, PA-C  oxyCODONE-acetaminophen (PERCOCET/ROXICET) 5-325 MG per tablet Take 2 tablets by mouth every 4 (four) hours as needed for moderate pain or severe pain. 06/26/14   Verl Dicker, PA-C  tamsulosin (FLOMAX) 0.4 MG CAPS capsule Take 1 capsule (0.4 mg total) by mouth daily after supper. Patient not taking: Reported on 06/26/2014 04/08/14   [place] L Creech, PA-C   BP 152/136 mmHg  Pulse 86  Temp(Src) 98.3 F (36.8 C) (Oral)  Resp 20  SpO2 94% Physical Exam  Constitutional: She is oriented to person, place, and time. She appears well-developed and well-nourished.  HENT:  Head: Normocephalic and atraumatic.  Mouth/Throat: Oropharynx is clear and moist.  Eyes: Conjunctivae are normal. Pupils are equal, round, and reactive to light. Right eye exhibits no discharge. Left eye exhibits no discharge. No scleral icterus.  Neck: Neck supple.  Cardiovascular: Normal rate, regular rhythm and normal heart sounds.   Pulmonary/Chest: Effort normal and breath sounds normal. No respiratory distress. She has no wheezes. She has no rales.  Abdominal: Soft. There is no tenderness.  Right CVA tenderness that is baseline for her due to partial nephrectomy. No left-sided CVA tenderness. Discomfort is reproduced upon palpation of lower left  quadrant and suprapubicly  Musculoskeletal: She exhibits no tenderness.  Neurological: She is alert and oriented to person, place, and time.  Cranial Nerves II-XII grossly intact  Skin: Skin is warm and dry. No rash noted.  Psychiatric: She has a normal mood and affect.  Nursing note and vitals reviewed.   ED Course  Procedures (including critical care time) Labs Review Labs Reviewed  URINALYSIS, ROUTINE W REFLEX MICROSCOPIC - Abnormal; Notable for the following:    APPearance CLOUDY (*)    Hgb urine dipstick LARGE (*)    Leukocytes, UA MODERATE (*)    All other components within normal limits  URINE MICROSCOPIC-ADD ON - Abnormal; Notable for the following:    Squamous Epithelial / LPF FEW (*)    All other components within normal limits  URINE CULTURE  CBC WITH DIFFERENTIAL/PLATELET  COMPREHENSIVE METABOLIC PANEL  LIPASE, BLOOD  Imaging Review US Renal  06/26/2014   CLINICAL DATA:  Abdominal pain  EXAM: RENAL/URINARY TRACT ULTRASOUND COMPLETE  COMPARISON:  CT abdomen/ pelvis 06/04/2014  FINDINGS: Right Kidney:  Length: 12.0 cm. Not well visualized due to patient body habitus. Echogenicity within normal limits. No mass or hydronephrosis visualized.  Left Kidney:  Length: 13 cm. Not well visualized due to patient body habitus. Echogenicity within normal limits. No mass or hydronephrosis visualized.  Bladder:  Appears normal for degree of bladder distention.  IMPRESSION: Grossly normal exam, suboptimal visualization of both kidneys due to body habitus.   Electronically Signed   By: Conchita Paris M.D.   On: 06/26/2014 19:56     EKG Interpretation None     Meds given in ED:  Medications  ondansetron (ZOFRAN-ODT) disintegrating tablet 4 mg (4 mg Oral Given 06/26/14 1830)  oxyCODONE-acetaminophen (PERCOCET/ROXICET) 5-325 MG per tablet 2 tablet (2 tablets Oral Given 06/26/14 1830)    Discharge Medication List as of 06/26/2014  8:32 PM    START taking these medications   Details   oxyCODONE-acetaminophen (PERCOCET/ROXICET) 5-325 MG per tablet Take 2 tablets by mouth every 4 (four) hours as needed for moderate pain or severe pain., Starting 06/26/2014, Until Discontinued, Print       Filed Vitals:   06/26/14 1620 06/26/14 1825 06/26/14 1948 06/26/14 2034  BP: 132/97 158/74 105/80 152/136  Pulse: 93 94 89 86  Temp: 97.8 F (36.6 C)   98.3 F (36.8 C)  TempSrc: Oral   Oral  Resp: 18 20 18 20   SpO2: 98% 99% 96% 94%      MDM  Vitals stable  -afebrile Pt resting comfortably in ED. PE--no evidence of acute or surgical abdomen. Labwork--evidence of UTI on urinalysis. Urine culture obtained Imaging--renal ultrasound shows no hydronephrosis and was a grossly normal exam  DDX--patient likely suffering from UTI with potential non obstructing stone. Will provide antibiotics, pain medicines, antiemetics and instructions to follow-up with her urologist. Well established pt with [facility].  I discussed all relevant lab findings and imaging results with pt and they verbalized understanding. Discussed f/u with PCP within 48 hrs and return precautions, pt very amenable to plan.  Final diagnoses:  UTI (lower urinary tract infection)  Abdominal discomfort        Verl Dicker, PA-C 06/27/14 1104  Charlesetta Shanks, MD 06/28/14 [phone]

## 2014-06-26 NOTE — Discharge Instructions (Signed)
Abdominal Pain, Women °Abdominal (stomach, pelvic, or belly) pain can be caused by many things. It is important to tell your doctor: °· The location of the pain. °· Does it come and go or is it present all the time? °· Are there things that start the pain (eating certain foods, exercise)? °· Are there other symptoms associated with the pain (fever, nausea, vomiting, diarrhea)? °All of this is helpful to know when trying to find the cause of the pain. °CAUSES  °· Stomach: virus or bacteria infection, or ulcer. °· Intestine: appendicitis (inflamed appendix), regional ileitis (Crohn's disease), ulcerative colitis (inflamed colon), irritable bowel syndrome, diverticulitis (inflamed diverticulum of the colon), or cancer of the stomach or intestine. °· Gallbladder disease or stones in the gallbladder. °· Kidney disease, kidney stones, or infection. °· Pancreas infection or cancer. °· Fibromyalgia (pain disorder). °· Diseases of the female organs: °¨ Uterus: fibroid (non-cancerous) tumors or infection. °¨ Fallopian tubes: infection or tubal pregnancy. °¨ Ovary: cysts or tumors. °¨ Pelvic adhesions (scar tissue). °¨ Endometriosis (uterus lining tissue growing in the pelvis and on the pelvic organs). °¨ Pelvic congestion syndrome (female organs filling up with blood just before the menstrual period). °¨ Pain with the menstrual period. °¨ Pain with ovulation (producing an egg). °¨ Pain with an IUD (intrauterine device, birth control) in the uterus. °¨ Cancer of the female organs. °· Functional pain (pain not caused by a disease, may improve without treatment). °· Psychological pain. °· Depression. °DIAGNOSIS  °Your doctor will decide the seriousness of your pain by doing an examination. °· Blood tests. °· X-rays. °· Ultrasound. °· CT scan (computed tomography, special type of X-ray). °· MRI (magnetic resonance imaging). °· Cultures, for infection. °· Barium enema (dye inserted in the large intestine, to better view it with  X-rays). °· Colonoscopy (looking in intestine with a lighted tube). °· Laparoscopy (minor surgery, looking in abdomen with a lighted tube). °· Major abdominal exploratory surgery (looking in abdomen with a large incision). °TREATMENT  °The treatment will depend on the cause of the pain.  °· Many cases can be observed and treated at home. °· Over-the-counter medicines recommended by your caregiver. °· Prescription medicine. °· Antibiotics, for infection. °· Birth control pills, for painful periods or for ovulation pain. °· Hormone treatment, for endometriosis. °· Nerve blocking injections. °· Physical therapy. °· Antidepressants. °· Counseling with a psychologist or psychiatrist. °· Minor or major surgery. °HOME CARE INSTRUCTIONS  °· Do not take laxatives, unless directed by your caregiver. °· Take over-the-counter pain medicine only if ordered by your caregiver. Do not take aspirin because it can cause an upset stomach or bleeding. °· Try a clear liquid diet (broth or water) as ordered by your caregiver. Slowly move to a bland diet, as tolerated, if the pain is related to the stomach or intestine. °· Have a thermometer and take your temperature several times a day, and record it. °· Bed rest and sleep, if it helps the pain. °· Avoid sexual intercourse, if it causes pain. °· Avoid stressful situations. °· Keep your follow-up appointments and tests, as your caregiver orders. °· If the pain does not go away with medicine or surgery, you may try: °¨ Acupuncture. °¨ Relaxation exercises (yoga, meditation). °¨ Group therapy. °¨ Counseling. °SEEK MEDICAL CARE IF:  °· You notice certain foods cause stomach pain. °· Your home care treatment is not helping your pain. °· You need stronger pain medicine. °· You want your IUD removed. °· You feel faint or   lightheaded.  You develop nausea and vomiting.  You develop a rash.  You are having side effects or an allergy to your medicine. SEEK IMMEDIATE MEDICAL CARE IF:   Your  pain does not go away or gets worse.  You have a fever.  Your pain is felt only in portions of the abdomen. The right side could possibly be appendicitis. The left lower portion of the abdomen could be colitis or diverticulitis.  You are passing blood in your stools (bright red or black tarry stools, with or without vomiting).  You have blood in your urine.  You develop chills, with or without a fever.  You pass out. MAKE SURE YOU:   Understand these instructions.  Will watch your condition.  Will get help right away if you are not doing well or get worse. Document Released: 03/05/2007 Document Revised: 09/22/2013 Document Reviewed: 03/25/2009 [facility] Patient Information 2015 [place], [place]. This information is not intended to replace advice given to you by your health care provider. Make sure you discuss any questions you have with your health care provider.   You were evaluated in the ED today for your abdominal discomfort. He was found to have a UTI on exam. There is no evidence of obstructing stone. He will be important for you to follow-up with your PCP and urologist for further evaluation and management of your symptoms. Please take your antibiotics and pain medicines as directed. Return to ED for new or worsening symptoms.

## 2014-06-28 LAB — URINE CULTURE
Colony Count: >=100000
Special Requests: NORMAL

## 2014-08-17 ENCOUNTER — Emergency Department (HOSPITAL_COMMUNITY)
Admission: EM | Admit: 2014-08-17 | Discharge: 2014-08-18 | Disposition: A | Discharge status: Home / Self Care | Payer: 59 | Attending: Emergency Medicine | Admitting: Emergency Medicine

## 2014-08-17 ENCOUNTER — Encounter (HOSPITAL_COMMUNITY): Payer: Self-pay | Admitting: Emergency Medicine

## 2014-08-17 DIAGNOSIS — R109 Unspecified abdominal pain: Secondary | ICD-10-CM

## 2014-08-17 DIAGNOSIS — R1031 Right lower quadrant pain: Secondary | ICD-10-CM | POA: Insufficient documentation

## 2014-08-17 DIAGNOSIS — Z85528 Personal history of other malignant neoplasm of kidney: Secondary | ICD-10-CM | POA: Insufficient documentation

## 2014-08-17 DIAGNOSIS — I10 Essential (primary) hypertension: Secondary | ICD-10-CM | POA: Insufficient documentation

## 2014-08-17 DIAGNOSIS — Z87448 Personal history of other diseases of urinary system: Secondary | ICD-10-CM | POA: Insufficient documentation

## 2014-08-17 DIAGNOSIS — Z9889 Other specified postprocedural states: Secondary | ICD-10-CM | POA: Insufficient documentation

## 2014-08-17 DIAGNOSIS — Z79899 Other long term (current) drug therapy: Secondary | ICD-10-CM | POA: Insufficient documentation

## 2014-08-17 DIAGNOSIS — Z87442 Personal history of urinary calculi: Secondary | ICD-10-CM | POA: Insufficient documentation

## 2014-08-17 DIAGNOSIS — Z85850 Personal history of malignant neoplasm of thyroid: Secondary | ICD-10-CM | POA: Insufficient documentation

## 2014-08-17 DIAGNOSIS — K219 Gastro-esophageal reflux disease without esophagitis: Secondary | ICD-10-CM | POA: Insufficient documentation

## 2014-08-17 DIAGNOSIS — E079 Disorder of thyroid, unspecified: Secondary | ICD-10-CM | POA: Insufficient documentation

## 2014-08-17 LAB — URINALYSIS, ROUTINE W REFLEX MICROSCOPIC
Bilirubin Urine: NEGATIVE
GLUCOSE, UA: NEGATIVE mg/dL
KETONES UR: NEGATIVE mg/dL
NITRITE: NEGATIVE
PH: 6 (ref 5.0–8.0)
Protein, ur: NEGATIVE mg/dL
SPECIFIC GRAVITY, URINE: 1.03 (ref 1.005–1.030)
Urobilinogen, UA: 0.2 mg/dL (ref 0.0–1.0)

## 2014-08-17 LAB — URINE MICROSCOPIC-ADD ON

## 2014-08-17 MED ORDER — HYDROMORPHONE HCL 1 MG/ML IJ SOLN
1.0000 mg | Freq: Once | INTRAMUSCULAR | Status: AC
  Administered 2014-08-18: 1 mg via INTRAVENOUS
  Filled 2014-08-17: qty 1

## 2014-08-17 MED ORDER — SODIUM CHLORIDE 0.9 % IV BOLUS (SEPSIS)
1000.0000 mL | Freq: Once | INTRAVENOUS | Status: AC
  Administered 2014-08-18: 1000 mL via INTRAVENOUS

## 2014-08-17 MED ORDER — ONDANSETRON HCL 4 MG/2ML IJ SOLN
4.0000 mg | Freq: Once | INTRAMUSCULAR | Status: AC
  Administered 2014-08-18: 4 mg via INTRAVENOUS
  Filled 2014-08-17: qty 2

## 2014-08-17 NOTE — ED Notes (Signed)
Pt states she has had abdominal pain, flank pain and hematuria x 4 days off and on. Hx of renal cell carcinoma in 2011. 1/2 of R kidney removed. Alert and oriented.

## 2014-08-18 ENCOUNTER — Emergency Department (HOSPITAL_COMMUNITY): Payer: 59

## 2014-08-18 LAB — CBC WITH DIFFERENTIAL/PLATELET
BASOS ABS: 0 10*3/uL (ref 0.0–0.1)
BASOS PCT: 0 % (ref 0–1)
EOS ABS: 0.2 10*3/uL (ref 0.0–0.7)
Eosinophils Relative: 2 % (ref 0–5)
HCT: 38 % (ref 36.0–46.0)
Hemoglobin: 12.4 g/dL (ref 12.0–15.0)
LYMPHS ABS: 2.7 10*3/uL (ref 0.7–4.0)
Lymphocytes Relative: 20 % (ref 12–46)
MCH: 26.8 pg (ref 26.0–34.0)
MCHC: 32.6 g/dL (ref 30.0–36.0)
MCV: 82.3 fL (ref 78.0–100.0)
MONO ABS: 0.8 10*3/uL (ref 0.1–1.0)
Monocytes Relative: 6 % (ref 3–12)
NEUTROS ABS: 10 10*3/uL — AB (ref 1.7–7.7)
Neutrophils Relative %: 72 % (ref 43–77)
Platelets: 310 10*3/uL (ref 150–400)
RBC: 4.62 MIL/uL (ref 3.87–5.11)
RDW: 14 % (ref 11.5–15.5)
WBC: 13.8 10*3/uL — AB (ref 4.0–10.5)

## 2014-08-18 LAB — LIPASE, BLOOD: LIPASE: 24 U/L (ref 11–59)

## 2014-08-18 LAB — COMPREHENSIVE METABOLIC PANEL
ALT: 24 U/L (ref 0–35)
AST: 28 U/L (ref 0–37)
Albumin: 4.4 g/dL (ref 3.5–5.2)
Alkaline Phosphatase: 103 U/L (ref 39–117)
Anion gap: 9 (ref 5–15)
BILIRUBIN TOTAL: 0.2 mg/dL — AB (ref 0.3–1.2)
BUN: 11 mg/dL (ref 6–23)
CALCIUM: 9.2 mg/dL (ref 8.4–10.5)
CO2: 24 mmol/L (ref 19–32)
CREATININE: 0.52 mg/dL (ref 0.50–1.10)
Chloride: 106 mmol/L (ref 96–112)
GFR calc non Af Amer: >90 mL/min (ref >90)
Glucose, Bld: 110 mg/dL — ABNORMAL HIGH (ref 70–99)
Potassium: 3.7 mmol/L (ref 3.5–5.1)
Sodium: 139 mmol/L (ref 135–145)
Total Protein: 7.7 g/dL (ref 6.0–8.3)

## 2014-08-18 MED ORDER — ONDANSETRON HCL 4 MG/2ML IJ SOLN
4.0000 mg | Freq: Once | INTRAMUSCULAR | Status: AC
  Administered 2014-08-18: 4 mg via INTRAVENOUS
  Filled 2014-08-18: qty 2

## 2014-08-18 MED ORDER — TAMSULOSIN HCL 0.4 MG PO CAPS: 0.4000 mg | ORAL_CAPSULE | Freq: Every day | ORAL | Status: DC

## 2014-08-18 MED ORDER — OXYCODONE-ACETAMINOPHEN 5-325 MG PO TABS: 1.0000 | ORAL_TABLET | Freq: Four times a day (QID) | ORAL | Status: DC | PRN

## 2014-08-18 MED ORDER — ONDANSETRON HCL 4 MG PO TABS: 4.0000 mg | ORAL_TABLET | Freq: Three times a day (TID) | ORAL | Status: DC | PRN

## 2014-08-18 MED ORDER — HYDROMORPHONE HCL 1 MG/ML IJ SOLN
1.0000 mg | Freq: Once | INTRAMUSCULAR | Status: AC
  Administered 2014-08-18: 1 mg via INTRAVENOUS
  Filled 2014-08-18: qty 1

## 2014-08-18 NOTE — ED Provider Notes (Signed)
CSN: [id number]     Arrival date & time 08/17/14  2031 History   First MD Initiated Contact with Patient 08/17/14 2219     Chief Complaint  Patient presents with  . Flank Pain  . Abdominal Pain  . Hematuria     (Consider location/radiation/quality/duration/timing/severity/associated sxs/prior Treatment) HPI Yolanda Matthews is a 44 year old female with past medical history of renal cell carcinoma, partial nephrectomy, kidney stones who presents the ER complaining of right-sided flank pain, nausea, vomiting. Patient states her flank pain began gradually 4 days ago. She states the pain initially was a waxing and waning pain. Patient states yesterday her pain became acutely worse, and she is now experiencing pain that radiates into her lower abdomen along with some mild dysuria. Patient reports associated nausea and vomiting today. Patient denies diarrhea, vaginal discomfort, vaginal bleeding, headache, blurred vision, weakness, dizziness, chest pain, shortness of breath. Patient states her signs and symptoms are consistent with previous kidney stones she has had in the past.  Past Medical History  Diagnosis Date  . Renal disorder   . Thyroid disease   . Hypertension   . Polycystic ovarian syndrome   . GERD (gastroesophageal reflux disease)   . Kidney stones   . History of kidney cancer   . Kidney stones   . Cancer     renal  . Thyroid cancer    Past Surgical History  Procedure Laterality Date  . Cesarean section    . Tonsillectomy    . Abdominal hysterectomy    . Partial nephrectomy     Family History  Problem Relation Age of Onset  . Hypertension Mother   . Diabetes Mother   . Hypertension Father   . Cancer Father    History  Substance Use Topics  . Smoking status: Never Smoker   . Smokeless tobacco: Never Used  . Alcohol Use: No   OB History    No data available     Review of Systems  Constitutional: Negative for fever.  HENT: Negative for trouble swallowing.    Eyes: Negative for visual disturbance.  Respiratory: Negative for shortness of breath.   Cardiovascular: Negative for chest pain.  Gastrointestinal: Positive for nausea, vomiting and abdominal pain.  Genitourinary: Positive for flank pain. Negative for dysuria.  Musculoskeletal: Negative for neck pain.  Skin: Negative for rash.  Neurological: Negative for dizziness, weakness and numbness.  Psychiatric/Behavioral: Negative.       Allergies  Demerol  Home Medications   Prior to Admission medications   Medication Sig Start Date End Date Taking? Authorizing Provider  acetaminophen (TYLENOL) 500 MG tablet Take 1,000 mg by mouth every 6 (six) hours as needed for moderate pain or fever (pain and fever).    Yes Historical Provider, MD  amLODipine (NORVASC) 5 MG tablet Take 5 mg by mouth daily.   Yes Historical Provider, MD  Cholecalciferol (VITAMIN D PO) Take 1 tablet by mouth daily.   Yes Historical Provider, MD  furosemide (LASIX) 40 MG tablet Take 40 mg by mouth daily.   Yes Historical Provider, MD  ibuprofen (ADVIL,MOTRIN) 200 MG tablet Take 600 mg by mouth every 6 (six) hours as needed for fever or moderate pain (pain and fever).    Yes Historical Provider, MD  levothyroxine (SYNTHROID, LEVOTHROID) 200 MCG tablet Take 200 mcg by mouth daily before breakfast.   Yes Historical Provider, MD  metoprolol tartrate (LOPRESSOR) 25 MG tablet Take 25 mg by mouth 2 (two) times daily.   Yes Historical Provider,  MD  Multiple Vitamin (MULTIVITAMIN WITH MINERALS) TABS tablet Take 1 tablet by mouth daily.   Yes Historical Provider, MD  Omega-3 Fatty Acids (FISH OIL PO) Take 1 tablet by mouth daily.   Yes Historical Provider, MD  omeprazole (PRILOSEC) 20 MG capsule Take 20 mg by mouth daily.   Yes Historical Provider, MD  sertraline (ZOLOFT) 100 MG tablet Take 50 mg by mouth daily.    Yes Historical Provider, MD  VITAMIN E PO Take 1 tablet by mouth daily.   Yes Historical Provider, MD  cephALEXin  (KEFLEX) 500 MG capsule Take 1 capsule (500 mg total) by mouth 4 (four) times daily. Patient not taking: Reported on 08/17/2014 06/26/14   Comer Locket, PA-C  HYDROcodone-acetaminophen (NORCO/VICODIN) 5-325 MG per tablet Take 1-2 tablets by mouth every 4 (four) hours as needed. Patient not taking: Reported on 04/09/2014 03/18/14   Dorie Rank, MD  HYDROcodone-acetaminophen (NORCO/VICODIN) 5-325 MG per tablet Take 2 tablets by mouth every 4 (four) hours as needed. Patient not taking: Reported on 06/26/2014 04/08/14   Al Corpus, PA-C  ondansetron (ZOFRAN ODT) 8 MG disintegrating tablet Take 1 tablet (8 mg total) by mouth every 8 (eight) hours as needed for nausea or vomiting. Patient not taking: Reported on 08/17/2014 06/26/14   Comer Locket, PA-C  ondansetron (ZOFRAN) 4 MG tablet Take 1 tablet (4 mg total) by mouth every 8 (eight) hours as needed for nausea or vomiting. 08/18/14   Dahlia Bailiff, PA-C  oxyCODONE-acetaminophen (PERCOCET) 5-325 MG per tablet Take 1-2 tablets by mouth every 6 (six) hours as needed. 08/18/14   Dahlia Bailiff, PA-C  tamsulosin (FLOMAX) 0.4 MG CAPS capsule Take 1 capsule (0.4 mg total) by mouth daily. 08/18/14   Dahlia Bailiff, PA-C   BP 116/99 mmHg  Pulse 85  Temp(Src) 98.9 F (37.2 C) (Oral)  Resp 16  SpO2 95% Physical Exam  Constitutional: She is oriented to person, place, and time. She appears well-developed and well-nourished. No distress.  HENT:  Head: Normocephalic and atraumatic.  Mouth/Throat: Oropharynx is clear and moist. No oropharyngeal exudate.  Eyes: Right eye exhibits no discharge. Left eye exhibits no discharge. No scleral icterus.  Neck: Normal range of motion.  Cardiovascular: Normal rate, regular rhythm and normal heart sounds.   No murmur heard. Pulmonary/Chest: Effort normal and breath sounds normal. No respiratory distress.  Abdominal: Soft. There is no tenderness.  Musculoskeletal: Normal range of motion. She exhibits no edema or tenderness.   Neurological: She is alert and oriented to person, place, and time. No cranial nerve deficit. Coordination normal.  Skin: Skin is warm and dry. No rash noted. She is not diaphoretic.  Psychiatric: She has a normal mood and affect.    ED Course  Procedures (including critical care time) Labs Review Labs Reviewed  URINALYSIS, ROUTINE W REFLEX MICROSCOPIC - Abnormal; Notable for the following:    APPearance CLOUDY (*)    Hgb urine dipstick LARGE (*)    Leukocytes, UA SMALL (*)    All other components within normal limits  URINE MICROSCOPIC-ADD ON - Abnormal; Notable for the following:    Squamous Epithelial / LPF FEW (*)    Bacteria, UA FEW (*)    Crystals CA OXALATE CRYSTALS (*)    All other components within normal limits  CBC WITH DIFFERENTIAL/PLATELET - Abnormal; Notable for the following:    WBC 13.8 (*)    Neutro Abs 10.0 (*)    All other components within normal limits  COMPREHENSIVE METABOLIC PANEL - Abnormal;  Notable for the following:    Glucose, Bld 110 (*)    Total Bilirubin 0.2 (*)    All other components within normal limits  URINE CULTURE  LIPASE, BLOOD    Imaging Review US Abdomen Complete  08/18/2014   CLINICAL DATA:  Acute onset of right lower quadrant pain and right flank pain. Initial encounter.  EXAM: ULTRASOUND ABDOMEN COMPLETE  COMPARISON:  Renal ultrasound performed 06/26/2014, and CT of the abdomen and pelvis performed 06/29/2014  FINDINGS: Gallbladder: No gallstones or wall thickening visualized. No sonographic Murphy sign noted.  Common bile duct: Diameter: 0.5 cm, within normal limits in caliber.  Liver: No focal lesion identified. Diffusely increased parenchymal echogenicity and coarsened echotexture, compatible with fatty infiltration.  IVC: No abnormality visualized.  Pancreas: Visualized portion unremarkable.  Spleen: Size and appearance within normal limits.  Right Kidney: Length: 12.0 cm. Echogenicity within normal limits. Status post resection of the  lower pole of the right kidney. No mass or hydronephrosis visualized.  Left Kidney: Length: 15.2 cm. Echogenicity within normal limits. No mass or hydronephrosis visualized.  Abdominal aorta: No aneurysm visualized.  Other findings: None.  IMPRESSION: 1. No acute abnormality seen within the abdomen. 2. Diffuse fatty infiltration within the liver.   Electronically Signed   By: Garald Balding M.D.   On: 08/18/2014 02:44     EKG Interpretation None      MDM   Final diagnoses:  Right flank pain  RLQ abdominal pain    S/s effectively controlled here.  Pt reports signs and symptoms are consistent with previous kidney stones. With bacteria noted on UA, we'll send culture, and treat UTI with ciprofloxacin to cover for UTI versus pyelonephritis. We'll follow up with renal ultrasound. Patient is afebrile, non-tachycardic, nontachypneic, non-hypoxic, well-appearing and in no acute distress. Abdominal exam is benign. No concern for acute abdomen.  Plan to discharge patient if renal/US abdomen complete returns as unremarkable. Patient signed out to Charlann Lange, PA-C with plan to follow-up on imaging.  BP 116/99 mmHg  Pulse 85  Temp(Src) 98.9 F (37.2 C) (Oral)  Resp 16  SpO2 95%  Signed,  Dahlia Bailiff, PA-C 2:56 AM  Patient discussed with Dr. Orvilla Cornwall, MD    Dahlia Bailiff, PA-C 08/18/14 7824  Shanon Rosser, MD 08/18/14 [phone]

## 2014-08-18 NOTE — ED Notes (Addendum)
Pt drank 12 oz cup of ginger ale.  Pt had no discomfort.

## 2014-08-18 NOTE — Discharge Instructions (Signed)
Ureteral Colic (Kidney Stones) °Ureteral colic is the result of a condition when kidney stones form inside the kidney. Once kidney stones are formed they may move into the tube that connects the kidney with the bladder (ureter). If this occurs, this condition may cause pain (colic) in the ureter.  °CAUSES  °Pain is caused by stone movement in the ureter and the obstruction caused by the stone. °SYMPTOMS  °The pain comes and goes as the ureter contracts around the stone. The pain is usually intense, sharp, and stabbing in character. The location of the pain may move as the stone moves through the ureter. When the stone is near the kidney the pain is usually located in the back and radiates to the belly (abdomen). When the stone is ready to pass into the bladder the pain is often located in the lower abdomen on the side the stone is located. At this location, the symptoms may mimic those of a urinary tract infection with urinary frequency. Once the stone is located here it often passes into the bladder and the pain disappears completely. °TREATMENT  °· Your caregiver will provide you with medicine for pain relief. °· You may require specialized follow-up X-rays. °· The absence of pain does not always mean that the stone has passed. It may have just stopped moving. If the urine remains completely obstructed, it can cause loss of kidney function or even complete destruction of the involved kidney. It is your responsibility and in your interest that X-rays and follow-ups as suggested by your caregiver are completed. Relief of pain without passage of the stone can be associated with severe damage to the kidney, including loss of kidney function on that side. °· If your stone does not pass on its own, additional measures may be taken by your caregiver to ensure its removal. °HOME CARE INSTRUCTIONS  °· Increase your fluid intake. Water is the preferred fluid since juices containing vitamin C may acidify the urine making it  less likely for certain stones (uric acid stones) to pass. °· Strain all urine. A strainer will be provided. Keep all particulate matter or stones for your caregiver to inspect. °· Take your pain medicine as directed. °· Make a follow-up appointment with your caregiver as directed. °· Remember that the goal is passage of your stone. The absence of pain does not mean the stone is gone. Follow your caregiver's instructions. °· Only take over-the-counter or prescription medicines for pain, discomfort, or fever as directed by your caregiver. °SEEK MEDICAL CARE IF:  °· Pain cannot be controlled with the prescribed medicine. °· You have a fever. °· Pain continues for longer than your caregiver advises it should. °· There is a change in the pain, and you develop chest discomfort or constant abdominal pain. °· You feel faint or pass out. °MAKE SURE YOU:  °· Understand these instructions. °· Will watch your condition. °· Will get help right away if you are not doing well or get worse. °Document Released: 02/15/2005 Document Revised: 09/02/2012 Document Reviewed: 11/02/2010 °[employer]® Patient Information ©2015 [employer]. This information is not intended to replace advice given to you by your health care provider. Make sure you discuss any questions you have with your health care provider. ° °

## 2014-08-18 NOTE — ED Provider Notes (Signed)
Frequent kidney stones, h/o renal CA with partial right nephrectomy. Here with flank pain x 2 days, worse today, radiating to lower abdomen, mild dysuria. N, V - "feels like kidney stone". No fever.  Pending renal ultrasound - r/o hydronephrosis.  No hydronephrosis on renal US. Pain is controlled. No further vomiting. No infection in urine. She has pending urology appointment this week. Feel she is stable for discharge home with return precautions.   Charlann Lange, PA-C 08/18/14 [phone]

## 2014-08-19 LAB — URINE CULTURE: Colony Count: 50000

## 2014-08-23 ENCOUNTER — Emergency Department (HOSPITAL_COMMUNITY): Payer: Self-pay

## 2014-08-23 ENCOUNTER — Encounter (HOSPITAL_COMMUNITY): Payer: Self-pay | Admitting: Emergency Medicine

## 2014-08-23 ENCOUNTER — Inpatient Hospital Stay (HOSPITAL_COMMUNITY)
Admission: EM | Admit: 2014-08-23 | Discharge: 2014-08-26 | DRG: 690 | Disposition: A | Discharge status: Home / Self Care | Payer: Self-pay | Attending: Internal Medicine | Admitting: Internal Medicine

## 2014-08-23 DIAGNOSIS — Z79899 Other long term (current) drug therapy: Secondary | ICD-10-CM

## 2014-08-23 DIAGNOSIS — R6883 Chills (without fever): Secondary | ICD-10-CM | POA: Diagnosis present

## 2014-08-23 DIAGNOSIS — E038 Other specified hypothyroidism: Secondary | ICD-10-CM | POA: Insufficient documentation

## 2014-08-23 DIAGNOSIS — R319 Hematuria, unspecified: Secondary | ICD-10-CM | POA: Diagnosis present

## 2014-08-23 DIAGNOSIS — I10 Essential (primary) hypertension: Secondary | ICD-10-CM | POA: Diagnosis present

## 2014-08-23 DIAGNOSIS — C73 Malignant neoplasm of thyroid gland: Secondary | ICD-10-CM | POA: Diagnosis present

## 2014-08-23 DIAGNOSIS — Z885 Allergy status to narcotic agent status: Secondary | ICD-10-CM

## 2014-08-23 DIAGNOSIS — Z833 Family history of diabetes mellitus: Secondary | ICD-10-CM

## 2014-08-23 DIAGNOSIS — Z905 Acquired absence of kidney: Secondary | ICD-10-CM | POA: Diagnosis present

## 2014-08-23 DIAGNOSIS — E039 Hypothyroidism, unspecified: Secondary | ICD-10-CM | POA: Diagnosis present

## 2014-08-23 DIAGNOSIS — Z87440 Personal history of urinary (tract) infections: Secondary | ICD-10-CM

## 2014-08-23 DIAGNOSIS — Z809 Family history of malignant neoplasm, unspecified: Secondary | ICD-10-CM

## 2014-08-23 DIAGNOSIS — R109 Unspecified abdominal pain: Secondary | ICD-10-CM

## 2014-08-23 DIAGNOSIS — N200 Calculus of kidney: Secondary | ICD-10-CM | POA: Diagnosis present

## 2014-08-23 DIAGNOSIS — Z791 Long term (current) use of non-steroidal anti-inflammatories (NSAID): Secondary | ICD-10-CM

## 2014-08-23 DIAGNOSIS — Z8249 Family history of ischemic heart disease and other diseases of the circulatory system: Secondary | ICD-10-CM

## 2014-08-23 DIAGNOSIS — Z79891 Long term (current) use of opiate analgesic: Secondary | ICD-10-CM

## 2014-08-23 DIAGNOSIS — N390 Urinary tract infection, site not specified: Principal | ICD-10-CM | POA: Diagnosis present

## 2014-08-23 DIAGNOSIS — E890 Postprocedural hypothyroidism: Secondary | ICD-10-CM | POA: Diagnosis present

## 2014-08-23 DIAGNOSIS — Z87442 Personal history of urinary calculi: Secondary | ICD-10-CM

## 2014-08-23 DIAGNOSIS — K219 Gastro-esophageal reflux disease without esophagitis: Secondary | ICD-10-CM | POA: Diagnosis present

## 2014-08-23 DIAGNOSIS — C649 Malignant neoplasm of unspecified kidney, except renal pelvis: Secondary | ICD-10-CM | POA: Diagnosis present

## 2014-08-23 DIAGNOSIS — E282 Polycystic ovarian syndrome: Secondary | ICD-10-CM | POA: Diagnosis present

## 2014-08-23 DIAGNOSIS — Z85850 Personal history of malignant neoplasm of thyroid: Secondary | ICD-10-CM

## 2014-08-23 LAB — URINALYSIS, ROUTINE W REFLEX MICROSCOPIC
BILIRUBIN URINE: NEGATIVE
Glucose, UA: NEGATIVE mg/dL
Ketones, ur: NEGATIVE mg/dL
Nitrite: NEGATIVE
Protein, ur: NEGATIVE mg/dL
Specific Gravity, Urine: 1.022 (ref 1.005–1.030)
Urobilinogen, UA: 0.2 mg/dL (ref 0.0–1.0)
pH: 8 (ref 5.0–8.0)

## 2014-08-23 LAB — PREGNANCY, URINE: PREG TEST UR: NEGATIVE

## 2014-08-23 LAB — BASIC METABOLIC PANEL
Anion gap: 11 (ref 5–15)
BUN: 12 mg/dL (ref 6–23)
CHLORIDE: 105 mmol/L (ref 96–112)
CO2: 23 mmol/L (ref 19–32)
Calcium: 9.4 mg/dL (ref 8.4–10.5)
Creatinine, Ser: 0.58 mg/dL (ref 0.50–1.10)
GFR calc non Af Amer: >90 mL/min (ref >90)
GLUCOSE: 107 mg/dL — AB (ref 70–99)
Potassium: 4.1 mmol/L (ref 3.5–5.1)
Sodium: 139 mmol/L (ref 135–145)

## 2014-08-23 LAB — CBC WITH DIFFERENTIAL/PLATELET
BASOS PCT: 0 % (ref 0–1)
Basophils Absolute: 0 10*3/uL (ref 0.0–0.1)
Eosinophils Absolute: 0.2 10*3/uL (ref 0.0–0.7)
Eosinophils Relative: 2 % (ref 0–5)
HEMATOCRIT: 40.4 % (ref 36.0–46.0)
Hemoglobin: 12.8 g/dL (ref 12.0–15.0)
LYMPHS PCT: 21 % (ref 12–46)
Lymphs Abs: 2.2 10*3/uL (ref 0.7–4.0)
MCH: 26.2 pg (ref 26.0–34.0)
MCHC: 31.7 g/dL (ref 30.0–36.0)
MCV: 82.8 fL (ref 78.0–100.0)
MONO ABS: 0.6 10*3/uL (ref 0.1–1.0)
Monocytes Relative: 6 % (ref 3–12)
NEUTROS PCT: 71 % (ref 43–77)
Neutro Abs: 7.3 10*3/uL (ref 1.7–7.7)
Platelets: 384 10*3/uL (ref 150–400)
RBC: 4.88 MIL/uL (ref 3.87–5.11)
RDW: 14 % (ref 11.5–15.5)
WBC: 10.2 10*3/uL (ref 4.0–10.5)

## 2014-08-23 LAB — URINE MICROSCOPIC-ADD ON

## 2014-08-23 MED ORDER — ONDANSETRON HCL 4 MG/2ML IJ SOLN
4.0000 mg | Freq: Once | INTRAMUSCULAR | Status: AC
  Administered 2014-08-23: 4 mg via INTRAVENOUS
  Filled 2014-08-23: qty 2

## 2014-08-23 MED ORDER — HYDROMORPHONE HCL 1 MG/ML IJ SOLN
1.0000 mg | Freq: Once | INTRAMUSCULAR | Status: AC
  Administered 2014-08-23: 1 mg via INTRAVENOUS
  Filled 2014-08-23: qty 1

## 2014-08-23 MED ORDER — HYDROCODONE-ACETAMINOPHEN 5-325 MG PO TABS: 1.0000 | ORAL_TABLET | ORAL | Status: DC | PRN

## 2014-08-23 MED ORDER — SODIUM CHLORIDE 0.9 % IV BOLUS (SEPSIS)
1000.0000 mL | Freq: Once | INTRAVENOUS | Status: AC
  Administered 2014-08-23: 1000 mL via INTRAVENOUS

## 2014-08-23 MED ORDER — ONDANSETRON 4 MG PO TBDP: ORAL_TABLET | ORAL | Status: DC

## 2014-08-23 MED ORDER — HYDROCODONE-ACETAMINOPHEN 5-325 MG PO TABS
2.0000 | ORAL_TABLET | Freq: Once | ORAL | Status: DC
  Filled 2014-08-23: qty 2

## 2014-08-23 MED ORDER — DEXTROSE 5 % IV SOLN
1.0000 g | Freq: Once | INTRAVENOUS | Status: AC
  Administered 2014-08-23: 1 g via INTRAVENOUS
  Filled 2014-08-23: qty 10

## 2014-08-23 MED ORDER — KETOROLAC TROMETHAMINE 15 MG/ML IJ SOLN
15.0000 mg | Freq: Once | INTRAMUSCULAR | Status: AC
  Administered 2014-08-23: 15 mg via INTRAVENOUS
  Filled 2014-08-23: qty 1

## 2014-08-23 MED ORDER — KETOROLAC TROMETHAMINE 15 MG/ML IJ SOLN
15.0000 mg | Freq: Once | INTRAMUSCULAR | Status: AC
  Administered 2014-08-24: 15 mg via INTRAVENOUS
  Filled 2014-08-23: qty 1

## 2014-08-23 NOTE — ED Notes (Addendum)
Pt from home c/o right flank pain that radiates to right lower abdomen. Nausea and vomiting present. She also endorses chills and hematuria. Long HX of kidney stones and kidney issues.

## 2014-08-23 NOTE — ED Notes (Signed)
Pt actively vomiting, giving Zofran 4mg  per protocol EDP notified

## 2014-08-23 NOTE — ED Provider Notes (Signed)
CSN: [id number]     Arrival date & time 08/23/14  1813 History   First MD Initiated Contact with Patient 08/23/14 1834     Chief Complaint  Patient presents with  . Flank Pain     (Consider location/radiation/quality/duration/timing/severity/associated sxs/prior Treatment) HPI Comments: 45 year old female with history of urine infection, multiple kidney stones 2 of which required assistance from urology majority are 4 mm are under pass on their own, recent visit one week prior for similar symptoms improved with supportive care presents with right flank pain radiating to the groin, similar to previous kidney stone history, mild hematuria and mild chills. No dysuria or fevers. Pain intermittent. Patient still has her appendix. Nothing specifically improves the pain except for time.  Patient is a 44 y.o. female presenting with flank pain. The history is provided by the patient.  Flank Pain Pertinent negatives include no chest pain, no abdominal pain, no headaches and no shortness of breath.    Past Medical History  Diagnosis Date  . Renal disorder   . Thyroid disease   . Hypertension   . Polycystic ovarian syndrome   . GERD (gastroesophageal reflux disease)   . Kidney stones   . History of kidney cancer   . Kidney stones   . Cancer     renal  . Thyroid cancer    Past Surgical History  Procedure Laterality Date  . Cesarean section    . Tonsillectomy    . Abdominal hysterectomy    . Partial nephrectomy     Family History  Problem Relation Age of Onset  . Hypertension Mother   . Diabetes Mother   . Hypertension Father   . Cancer Father    History  Substance Use Topics  . Smoking status: Never Smoker   . Smokeless tobacco: Never Used  . Alcohol Use: No   OB History    No data available     Review of Systems  Constitutional: Negative for fever and chills.  HENT: Negative for congestion.   Eyes: Negative for visual disturbance.  Respiratory: Negative for shortness of  breath.   Cardiovascular: Negative for chest pain.  Gastrointestinal: Positive for nausea and vomiting. Negative for abdominal pain.  Genitourinary: Positive for flank pain. Negative for dysuria.  Musculoskeletal: Negative for back pain, neck pain and neck stiffness.  Skin: Negative for rash.  Neurological: Negative for light-headedness and headaches.      Allergies  Demerol  Home Medications   Prior to Admission medications   Medication Sig Start Date End Date Taking? Authorizing Provider  acetaminophen (TYLENOL) 500 MG tablet Take 1,000 mg by mouth every 6 (six) hours as needed for moderate pain or fever (pain and fever).    Yes Historical Provider, MD  amLODipine (NORVASC) 5 MG tablet Take 5 mg by mouth daily.   Yes Historical Provider, MD  Cholecalciferol (VITAMIN D PO) Take 1 tablet by mouth daily.   Yes Historical Provider, MD  furosemide (LASIX) 40 MG tablet Take 40 mg by mouth daily.   Yes Historical Provider, MD  ibuprofen (ADVIL,MOTRIN) 200 MG tablet Take 600 mg by mouth every 6 (six) hours as needed for fever or moderate pain (pain and fever).    Yes Historical Provider, MD  levothyroxine (SYNTHROID, LEVOTHROID) 200 MCG tablet Take 200 mcg by mouth daily before breakfast.   Yes Historical Provider, MD  metoprolol tartrate (LOPRESSOR) 25 MG tablet Take 25 mg by mouth 2 (two) times daily.   Yes Historical Provider, MD  Multiple  Vitamin (MULTIVITAMIN WITH MINERALS) TABS tablet Take 1 tablet by mouth daily.   Yes Historical Provider, MD  Omega-3 Fatty Acids (FISH OIL PO) Take 1 tablet by mouth daily.   Yes Historical Provider, MD  omeprazole (PRILOSEC) 20 MG capsule Take 20 mg by mouth daily.   Yes Historical Provider, MD  ondansetron (ZOFRAN ODT) 8 MG disintegrating tablet Take 1 tablet (8 mg total) by mouth every 8 (eight) hours as needed for nausea or vomiting. 06/26/14  Yes Comer Locket, PA-C  oxyCODONE-acetaminophen (PERCOCET) 5-325 MG per tablet Take 1-2 tablets by mouth  every 6 (six) hours as needed. 08/18/14  Yes Dahlia Bailiff, PA-C  sertraline (ZOLOFT) 50 MG tablet Take 50 mg by mouth daily.   Yes Historical Provider, MD  VITAMIN E PO Take 1 tablet by mouth daily.   Yes Historical Provider, MD  cephALEXin (KEFLEX) 500 MG capsule Take 1 capsule (500 mg total) by mouth 4 (four) times daily. Patient not taking: Reported on 08/17/2014 06/26/14   Comer Locket, PA-C  HYDROcodone-acetaminophen [facility]) 5-325 MG per tablet Take 1-2 tablets by mouth every 4 (four) hours as needed. 08/23/14   Elnora Morrison, MD  HYDROcodone-acetaminophen (NORCO/VICODIN) 5-325 MG per tablet Take 2 tablets by mouth every 4 (four) hours as needed. Patient not taking: Reported on 06/26/2014 04/08/14   Al Corpus, PA-C  ondansetron (ZOFRAN ODT) 4 MG disintegrating tablet 4mg  ODT q4 hours prn nausea/vomit 08/23/14   Elnora Morrison, MD  ondansetron (ZOFRAN) 4 MG tablet Take 1 tablet (4 mg total) by mouth every 8 (eight) hours as needed for nausea or vomiting. Patient not taking: Reported on 08/23/2014 08/18/14   Dahlia Bailiff, PA-C  tamsulosin (FLOMAX) 0.4 MG CAPS capsule Take 1 capsule (0.4 mg total) by mouth daily. Patient not taking: Reported on 08/23/2014 08/18/14   Dahlia Bailiff, PA-C   BP 126/73 mmHg  Pulse 81  Temp(Src) 98.1 F (36.7 C) (Oral)  Resp 16  SpO2 95% Physical Exam  Constitutional: She is oriented to person, place, and time. She appears well-developed and well-nourished.  HENT:  Head: Normocephalic and atraumatic.  Mild dry mucous membranes  Eyes: Conjunctivae are normal. Right eye exhibits no discharge. Left eye exhibits no discharge.  Neck: Normal range of motion. Neck supple. No tracheal deviation present.  Cardiovascular: Regular rhythm.  Tachycardia present.   Pulmonary/Chest: Effort normal and breath sounds normal.  Abdominal: Soft. She exhibits no distension. There is no tenderness. There is no guarding.  Musculoskeletal: She exhibits tenderness (mild right flank). She exhibits no  edema.  Neurological: She is alert and oriented to person, place, and time.  Skin: Skin is warm. No rash noted.  Psychiatric: She has a normal mood and affect.  Nursing note and vitals reviewed.   ED Course  Procedures (including critical care time) Emergency Focused Ultrasound Exam Limited retroperitoneal ultrasound of kidneys  Performed and interpreted by Dr. Reather Converse Indication: flank pain Focused abdominal ultrasound with both kidneys imaged in transverse and longitudinal planes in real-time. Interpretation: possible mild right hydronephrosis visualized.  Difficult due to body habitus Images archived electronically  CPT Code: [phone] (limited retroperitoneal)  Labs Review Labs Reviewed  BASIC METABOLIC PANEL - Abnormal; Notable for the following:    Glucose, Bld 107 (*)    All other components within normal limits  URINALYSIS, ROUTINE W REFLEX MICROSCOPIC - Abnormal; Notable for the following:    Color, Urine RED (*)    APPearance TURBID (*)    Hgb urine dipstick LARGE (*)  Leukocytes, UA SMALL (*)    All other components within normal limits  URINE MICROSCOPIC-ADD ON - Abnormal; Notable for the following:    Bacteria, UA MANY (*)    All other components within normal limits  URINE CULTURE  CBC WITH DIFFERENTIAL/PLATELET  PREGNANCY, URINE    Imaging Review No results found.   EKG Interpretation None      MDM   Final diagnoses:  Acute right flank pain  Hematuria  UTI  Patient clinically presents with recurrent kidney stone, flank pain no focal abdominal pain at this time mild clinical dehydration. Plan for IV fluids pain meds, bedside ultrasound, patient has follow-up appointment with urology. Plan to check for signs of kidney infection. Patient has a history of renal cell carcinoma with partial nephrectomy.   Medications  0.9 %  sodium chloride infusion (not administered)  ondansetron (ZOFRAN) injection 4 mg (not administered)  HYDROmorphone (DILAUDID)  injection 0.5 mg (not administered)  sodium chloride 0.9 % bolus 1,000 mL (0 mLs Intravenous Stopped 08/23/14 2121)  ondansetron (ZOFRAN) injection 4 mg (4 mg Intravenous Given 08/23/14 2007)  HYDROmorphone (DILAUDID) injection 1 mg (1 mg Intravenous Given 08/23/14 2007)  cefTRIAXone (ROCEPHIN) 1 g in dextrose 5 % 50 mL IVPB (0 g Intravenous Stopped 08/23/14 2151)  ketorolac (TORADOL) 15 MG/ML injection 15 mg (15 mg Intravenous Given 08/23/14 2119)  ondansetron (ZOFRAN) injection 4 mg (4 mg Intravenous Given 08/23/14 2311)  HYDROmorphone (DILAUDID) injection 1 mg (1 mg Intravenous Given 08/23/14 2324)  sodium chloride 0.9 % bolus 1,000 mL (1,000 mLs Intravenous New Bag/Given 08/23/14 2324)  ketorolac (TORADOL) 15 MG/ML injection 15 mg (15 mg Intravenous Given 08/24/14 0020)    Filed Vitals:   08/23/14 1836 08/23/14 2100 08/23/14 2332  BP: 137/95 145/85 126/73  Pulse: 106 81 81  Temp: 98.1 F (36.7 C)    TempSrc: Oral    Resp: 18 16 16   SpO2: 100% 96% 95%    Final diagnoses:  Acute right flank pain  Hematuria   Patient improved in ER and comfortable discharge however on discharge pain worsened and vomiting in the room. Repeat fluids and pain meds. Discussed with hospitalist for observation and symptom control, likely plan for urology consult the morning if no improvement. Patient requesting to hold on CT scan as she has had multiple the past and risk of radiation discussed. Rocephin given for possible urine infection.      Elnora Morrison, MD 08/24/14 [phone]

## 2014-08-24 ENCOUNTER — Observation Stay (HOSPITAL_COMMUNITY): Payer: Self-pay

## 2014-08-24 ENCOUNTER — Emergency Department (HOSPITAL_COMMUNITY): Payer: Self-pay

## 2014-08-24 DIAGNOSIS — N390 Urinary tract infection, site not specified: Principal | ICD-10-CM | POA: Insufficient documentation

## 2014-08-24 DIAGNOSIS — E038 Other specified hypothyroidism: Secondary | ICD-10-CM | POA: Insufficient documentation

## 2014-08-24 DIAGNOSIS — C649 Malignant neoplasm of unspecified kidney, except renal pelvis: Secondary | ICD-10-CM | POA: Diagnosis present

## 2014-08-24 DIAGNOSIS — E039 Hypothyroidism, unspecified: Secondary | ICD-10-CM | POA: Diagnosis present

## 2014-08-24 DIAGNOSIS — C73 Malignant neoplasm of thyroid gland: Secondary | ICD-10-CM | POA: Diagnosis present

## 2014-08-24 DIAGNOSIS — E282 Polycystic ovarian syndrome: Secondary | ICD-10-CM | POA: Diagnosis present

## 2014-08-24 DIAGNOSIS — I10 Essential (primary) hypertension: Secondary | ICD-10-CM | POA: Diagnosis present

## 2014-08-24 DIAGNOSIS — R1011 Right upper quadrant pain: Secondary | ICD-10-CM

## 2014-08-24 DIAGNOSIS — N200 Calculus of kidney: Secondary | ICD-10-CM

## 2014-08-24 DIAGNOSIS — R109 Unspecified abdominal pain: Secondary | ICD-10-CM | POA: Diagnosis present

## 2014-08-24 LAB — CBC WITH DIFFERENTIAL/PLATELET
Basophils Absolute: 0 10*3/uL (ref 0.0–0.1)
Basophils Relative: 0 % (ref 0–1)
EOS PCT: 3 % (ref 0–5)
Eosinophils Absolute: 0.3 10*3/uL (ref 0.0–0.7)
HEMATOCRIT: 33.2 % — AB (ref 36.0–46.0)
HEMOGLOBIN: 10.8 g/dL — AB (ref 12.0–15.0)
LYMPHS ABS: 2.9 10*3/uL (ref 0.7–4.0)
LYMPHS PCT: 30 % (ref 12–46)
MCH: 26.9 pg (ref 26.0–34.0)
MCHC: 32.5 g/dL (ref 30.0–36.0)
MCV: 82.8 fL (ref 78.0–100.0)
MONO ABS: 0.7 10*3/uL (ref 0.1–1.0)
MONOS PCT: 7 % (ref 3–12)
NEUTROS ABS: 5.9 10*3/uL (ref 1.7–7.7)
Neutrophils Relative %: 60 % (ref 43–77)
Platelets: 297 10*3/uL (ref 150–400)
RBC: 4.01 MIL/uL (ref 3.87–5.11)
RDW: 14 % (ref 11.5–15.5)
WBC: 9.8 10*3/uL (ref 4.0–10.5)

## 2014-08-24 LAB — URINE CULTURE
Colony Count: NO GROWTH
Culture: NO GROWTH

## 2014-08-24 LAB — COMPREHENSIVE METABOLIC PANEL
ALT: 26 U/L (ref 0–35)
AST: 24 U/L (ref 0–37)
Albumin: 3.7 g/dL (ref 3.5–5.2)
Alkaline Phosphatase: 85 U/L (ref 39–117)
Anion gap: 7 (ref 5–15)
BILIRUBIN TOTAL: 0.2 mg/dL — AB (ref 0.3–1.2)
BUN: 12 mg/dL (ref 6–23)
CHLORIDE: 108 mmol/L (ref 96–112)
CO2: 24 mmol/L (ref 19–32)
Calcium: 8.4 mg/dL (ref 8.4–10.5)
Creatinine, Ser: 0.53 mg/dL (ref 0.50–1.10)
GLUCOSE: 99 mg/dL (ref 70–99)
POTASSIUM: 4.2 mmol/L (ref 3.5–5.1)
Sodium: 139 mmol/L (ref 135–145)
Total Protein: 6.5 g/dL (ref 6.0–8.3)

## 2014-08-24 LAB — MAGNESIUM: MAGNESIUM: 1.9 mg/dL (ref 1.5–2.5)

## 2014-08-24 MED ORDER — ONDANSETRON HCL 4 MG/2ML IJ SOLN
4.0000 mg | Freq: Four times a day (QID) | INTRAMUSCULAR | Status: DC | PRN
  Administered 2014-08-24 – 2014-08-25 (×3): 4 mg via INTRAVENOUS
  Filled 2014-08-24 (×3): qty 2

## 2014-08-24 MED ORDER — HEPARIN SODIUM (PORCINE) 5000 UNIT/ML IJ SOLN
5000.0000 [IU] | Freq: Three times a day (TID) | INTRAMUSCULAR | Status: DC
  Administered 2014-08-24 – 2014-08-26 (×7): 5000 [IU] via SUBCUTANEOUS
  Filled 2014-08-24 (×12): qty 1

## 2014-08-24 MED ORDER — AMLODIPINE BESYLATE 5 MG PO TABS
5.0000 mg | ORAL_TABLET | Freq: Every day | ORAL | Status: DC
  Administered 2014-08-24 – 2014-08-26 (×3): 5 mg via ORAL
  Filled 2014-08-24 (×4): qty 1

## 2014-08-24 MED ORDER — SODIUM CHLORIDE 0.9 % IV SOLN
INTRAVENOUS | Status: AC
  Administered 2014-08-24: 01:00:00 via INTRAVENOUS

## 2014-08-24 MED ORDER — ACETAMINOPHEN 325 MG PO TABS
650.0000 mg | ORAL_TABLET | Freq: Four times a day (QID) | ORAL | Status: DC | PRN
  Administered 2014-08-26: 650 mg via ORAL
  Filled 2014-08-24: qty 2

## 2014-08-24 MED ORDER — BISACODYL 5 MG PO TBEC: 5.0000 mg | DELAYED_RELEASE_TABLET | Freq: Every day | ORAL | Status: DC | PRN

## 2014-08-24 MED ORDER — MORPHINE SULFATE 2 MG/ML IJ SOLN
1.0000 mg | INTRAMUSCULAR | Status: DC | PRN
  Administered 2014-08-24 – 2014-08-26 (×9): 2 mg via INTRAVENOUS
  Filled 2014-08-24 (×10): qty 1

## 2014-08-24 MED ORDER — SODIUM CHLORIDE 0.9 % IV SOLN
INTRAVENOUS | Status: DC
  Administered 2014-08-24: 16:00:00 via INTRAVENOUS
  Administered 2014-08-25: 75 mL/h via INTRAVENOUS

## 2014-08-24 MED ORDER — ASPIRIN EC 81 MG PO TBEC
81.0000 mg | DELAYED_RELEASE_TABLET | Freq: Every day | ORAL | Status: DC
  Administered 2014-08-24 – 2014-08-26 (×3): 81 mg via ORAL
  Filled 2014-08-24 (×4): qty 1

## 2014-08-24 MED ORDER — PANTOPRAZOLE SODIUM 40 MG PO TBEC
40.0000 mg | DELAYED_RELEASE_TABLET | Freq: Every day | ORAL | Status: DC
  Administered 2014-08-24 – 2014-08-26 (×3): 40 mg via ORAL
  Filled 2014-08-24 (×3): qty 1

## 2014-08-24 MED ORDER — ONDANSETRON HCL 4 MG/2ML IJ SOLN
4.0000 mg | Freq: Three times a day (TID) | INTRAMUSCULAR | Status: AC | PRN
  Administered 2014-08-24 (×2): 4 mg via INTRAVENOUS
  Filled 2014-08-24 (×2): qty 2

## 2014-08-24 MED ORDER — SERTRALINE HCL 50 MG PO TABS
50.0000 mg | ORAL_TABLET | Freq: Every day | ORAL | Status: DC
  Administered 2014-08-24 – 2014-08-26 (×3): 50 mg via ORAL
  Filled 2014-08-24 (×4): qty 1

## 2014-08-24 MED ORDER — HYDROMORPHONE HCL 1 MG/ML IJ SOLN
0.5000 mg | INTRAMUSCULAR | Status: AC | PRN
  Administered 2014-08-24 (×3): 0.5 mg via INTRAVENOUS
  Filled 2014-08-24 (×3): qty 1

## 2014-08-24 MED ORDER — ZOLPIDEM TARTRATE 5 MG PO TABS: 5.0000 mg | ORAL_TABLET | Freq: Every evening | ORAL | Status: DC | PRN

## 2014-08-24 MED ORDER — SODIUM CHLORIDE 0.9 % IV SOLN
INTRAVENOUS | Status: DC
  Administered 2014-08-24: 06:00:00 via INTRAVENOUS

## 2014-08-24 MED ORDER — DEXTROSE 5 % IV SOLN
1.0000 g | INTRAVENOUS | Status: DC
  Administered 2014-08-24 – 2014-08-25 (×2): 1 g via INTRAVENOUS
  Filled 2014-08-24 (×3): qty 10

## 2014-08-24 MED ORDER — ACETAMINOPHEN 650 MG RE SUPP: 650.0000 mg | Freq: Four times a day (QID) | RECTAL | Status: DC | PRN

## 2014-08-24 MED ORDER — DOCUSATE SODIUM 100 MG PO CAPS
100.0000 mg | ORAL_CAPSULE | Freq: Two times a day (BID) | ORAL | Status: DC
  Administered 2014-08-24 – 2014-08-26 (×5): 100 mg via ORAL
  Filled 2014-08-24 (×6): qty 1

## 2014-08-24 MED ORDER — LEVOTHYROXINE SODIUM 200 MCG PO TABS
200.0000 ug | ORAL_TABLET | Freq: Every day | ORAL | Status: DC
  Administered 2014-08-24 – 2014-08-26 (×3): 200 ug via ORAL
  Filled 2014-08-24 (×6): qty 1

## 2014-08-24 NOTE — H&P (Signed)
[facility] History and Physical  Yolanda Matthews KNL:[id number] DOB: [birth year] DOA: 08/23/2014  Referring physician:  PCP: Lorenza Chick, MD  Specialists:   Chief Complaint: 44 year old WF PMHx depression, recurrent urine infection, essential HTN, polycystic ovarian syndrome, right renal cell cancer S/P right partial nephrectomy , thyroid cancer unknown type S/P total thyroidectomy (hypothyroidism), multiple episodes of nephrolithiasis;required assistance from urology majority are 4 mm are under pass on their own, recent visit one week prior for similar symptoms improved with supportive care presents with right flank pain radiating to the groin, similar to previous kidney stone history, mild hematuria and mild chills. No dysuria or fevers. Pain intermittent. Patient still has her appendix. Nothing specifically improves the pain except for time.   Review of Systems: The patient denies anorexia, fever, weight loss,, vision loss, decreased hearing, hoarseness, chest pain, syncope, dyspnea on exertion, peripheral edema, balance deficits, hemoptysis, melena, hematochezia, severe indigestion/heartburn, incontinence, genital sores, muscle weakness, suspicious skin lesions, transient blindness, difficulty walking, depression, unusual weight change, abnormal bleeding, enlarged lymph nodes, angioedema, and breast masses.    TRAVEL HISTORY:    Consultants:    Procedure/Significant Events:      Culture  4/4 urine pending 4/4 blood pending   Antibiotics:  Ceftriaxone 4/4>>   DVT prophylaxis:  Heparin subcutaneous  Devices     LINES / TUBES:     Past Medical History  Diagnosis Date  . Renal disorder   . Thyroid disease   . Hypertension   . Polycystic ovarian syndrome   . GERD (gastroesophageal reflux disease)   . Kidney stones   . History of kidney cancer   . Kidney stones   . Cancer     renal  . Thyroid cancer    Past Surgical History  Procedure Laterality Date   . Cesarean section    . Tonsillectomy    . Abdominal hysterectomy    . Partial nephrectomy     Social History:  reports that she has never smoked. She has never used smokeless tobacco. She reports that she does not drink alcohol or use illicit drugs.     Allergies  Allergen Reactions  . Demerol [Meperidine Hcl] Hives, Itching and Palpitations    Family History  Problem Relation Age of Onset  . Hypertension Mother   . Diabetes Mother   . Hypertension Father   . Cancer Father     Prior to Admission medications   Medication Sig Start Date End Date Taking? Authorizing Provider  acetaminophen (TYLENOL) 500 MG tablet Take 1,000 mg by mouth every 6 (six) hours as needed for moderate pain or fever (pain and fever).    Yes Historical Provider, MD  amLODipine (NORVASC) 5 MG tablet Take 5 mg by mouth daily.   Yes Historical Provider, MD  Cholecalciferol (VITAMIN D PO) Take 1 tablet by mouth daily.   Yes Historical Provider, MD  furosemide (LASIX) 40 MG tablet Take 40 mg by mouth daily.   Yes Historical Provider, MD  ibuprofen (ADVIL,MOTRIN) 200 MG tablet Take 600 mg by mouth every 6 (six) hours as needed for fever or moderate pain (pain and fever).    Yes Historical Provider, MD  levothyroxine (SYNTHROID, LEVOTHROID) 200 MCG tablet Take 200 mcg by mouth daily before breakfast.   Yes Historical Provider, MD  metoprolol tartrate (LOPRESSOR) 25 MG tablet Take 25 mg by mouth 2 (two) times daily.   Yes Historical Provider, MD  Multiple Vitamin (MULTIVITAMIN WITH MINERALS) TABS tablet Take 1 tablet by mouth daily.  Yes Historical Provider, MD  Omega-3 Fatty Acids (FISH OIL PO) Take 1 tablet by mouth daily.   Yes Historical Provider, MD  omeprazole (PRILOSEC) 20 MG capsule Take 20 mg by mouth daily.   Yes Historical Provider, MD  ondansetron (ZOFRAN ODT) 8 MG disintegrating tablet Take 1 tablet (8 mg total) by mouth every 8 (eight) hours as needed for nausea or vomiting. 06/26/14  Yes Comer Locket, PA-C  oxyCODONE-acetaminophen (PERCOCET) 5-325 MG per tablet Take 1-2 tablets by mouth every 6 (six) hours as needed. 08/18/14  Yes Dahlia Bailiff, PA-C  sertraline (ZOLOFT) 50 MG tablet Take 50 mg by mouth daily.   Yes Historical Provider, MD  VITAMIN E PO Take 1 tablet by mouth daily.   Yes Historical Provider, MD  cephALEXin (KEFLEX) 500 MG capsule Take 1 capsule (500 mg total) by mouth 4 (four) times daily. Patient not taking: Reported on 08/17/2014 06/26/14   Comer Locket, PA-C  HYDROcodone-acetaminophen [facility]) 5-325 MG per tablet Take 1-2 tablets by mouth every 4 (four) hours as needed. 08/23/14   Elnora Morrison, MD  HYDROcodone-acetaminophen (NORCO/VICODIN) 5-325 MG per tablet Take 2 tablets by mouth every 4 (four) hours as needed. Patient not taking: Reported on 06/26/2014 04/08/14   Al Corpus, PA-C  ondansetron (ZOFRAN ODT) 4 MG disintegrating tablet 4mg  ODT q4 hours prn nausea/vomit 08/23/14   Elnora Morrison, MD  ondansetron (ZOFRAN) 4 MG tablet Take 1 tablet (4 mg total) by mouth every 8 (eight) hours as needed for nausea or vomiting. Patient not taking: Reported on 08/23/2014 08/18/14   Dahlia Bailiff, PA-C  tamsulosin (FLOMAX) 0.4 MG CAPS capsule Take 1 capsule (0.4 mg total) by mouth daily. Patient not taking: Reported on 08/23/2014 08/18/14   Dahlia Bailiff, PA-C   Physical Exam: Filed Vitals:   08/23/14 1836 08/23/14 2100 08/23/14 2332  BP: 137/95 145/85 126/73  Pulse: 106 81 81  Temp: 98.1 F (36.7 C)    TempSrc: Oral    Resp: 18 16 16   SpO2: 100% 96% 95%     General:  A/O 4, mild to moderate waxing and waning distress secondary to N/V  Eyes: Pupils equal round reactive to light and accommodation  Neck: Negative JVD, negative lymphadenopathy  Cardiovascular: Regular in rate, negative murmurs rubs or gallops, normal S1/S2  Respiratory: Clear to auscultation bilateral  Abdomen: Morbidly obese, Pain to palpation suprapubic, right CVA tenderness  Musculoskeletal: Negative  pedal edema    Labs on Admission:  Basic Metabolic Panel:  Recent Labs Lab 08/18/14 0012 08/23/14 1930  NA 139 139  K 3.7 4.1  CL 106 105  CO2 24 23  GLUCOSE 110* 107*  BUN 11 12  CREATININE 0.52 0.58  CALCIUM 9.2 9.4   Liver Function Tests:  Recent Labs Lab 08/18/14 0012  AST 28  ALT 24  ALKPHOS 103  BILITOT 0.2*  PROT 7.7  ALBUMIN 4.4    Recent Labs Lab 08/18/14 0012  LIPASE 24   No results for input(s): AMMONIA in the last 168 hours. CBC:  Recent Labs Lab 08/18/14 0012 08/23/14 1930  WBC 13.8* 10.2  NEUTROABS 10.0* 7.3  HGB 12.4 12.8  HCT 38.0 40.4  MCV 82.3 82.8  PLT 310 384   Cardiac Enzymes: No results for input(s): CKTOTAL, CKMB, CKMBINDEX, TROPONINI in the last 168 hours.  BNP (last 3 results) No results for input(s): BNP in the last 8760 hours.  ProBNP (last 3 results) No results for input(s): PROBNP in the last 8760 hours.  CBG:  No results for input(s): GLUCAP in the last 168 hours.  Radiological Exams on Admission: No results found.  EKG: N/A  Assessment/Plan Principal Problem:   Urinary tract infection Active Problems:   Acute right flank pain   Hypothyroidism   Recurrent nephrolithiasis   Essential hypertension   Polycystic ovarian syndrome   Renal cell cancer   Thyroid cancer  Urinary tract infection with hematuria -Continue ceftriaxone -Normal saline 173ml/hr -Right CVA tenderness; patient would like to hold off on CT scan to rule in/rule out new kidney stone at this time secondary to multiple episodes of radiation because of her recurrent nephrolithiasis. - Recurrent nephrolithiasis -See urinary tract infection -If no resolution with hydration and antibiotics obtain CT scan and possible urology consult  Renal cell carcinoma -S/P partial right nephrectomy stable  Polycystic ovarian syndrome -Stable; renal function within normal limit  Essential hypertension -Currently controlled without BP medication,  monitor closely while patient rehydrated  Hypothyroidism -Continue levothyroxine on 200 g daily -TSH pending     Code Status: Full Family Communication: Young daughter present  Disposition Plan:  Resolution UTI  Time spent: 55 minutes  Allie Bossier [facility] Pager [phone]  If 7PM-7AM, please contact night-coverage [email or web address] Password [facility] 08/24/2014, 12:35 AM

## 2014-08-24 NOTE — Progress Notes (Signed)
[facility] PROGRESS NOTE  Yolanda Matthews GQB:[id number] DOB: Oct 06, 1970 DOA: 08/23/2014 PCP: Lorenza Chick, MD  Assessment/Plan: 1. Nephrolithiasis -Patient with a history of nephrolithiasis presenting with right flank pain with radiation to her groin. -Will continue IV fluid hydration, narcotic analgesics for pain control, empiric IV antimicrobial therapy for urinary tract infection. -Will check bilateral kidney ultrasound  2.  Urinary tract infection. -UA showing the presence of many bacteria along with white blood cells. -Continue empiric IV antibiotic therapy with ceftriaxone 1 g IV every 24 hours, follow-up on urine cultures.  3.  Renal cell carcinoma -Status post partial right nephrectomy  Code Status: Full code Family Communication:  Disposition Plan: Continue empiric IV and robotic therapy, IV fluids, narcotic analgesics  Antibiotics:  Ceftriaxone 1 g IV every 24 hours  HPI/Subjective: Patient is a pleasant 44 year old female with a past medical history of recurrent UTIs, history of right renal cell carcinoma status post partial nephrectomy, history of kidney stones, admitted to the medicine service on 08/24/2014 when she presented with complaints of right flank pain. She reported that symptoms were similar to prior is episodes of kidney stones. Patient also complained of mild hematuria along with subjective fevers and chills. Urinalysis revealed the presence of many bacteria along with WBC's. Patient was started on ceftriaxone 1 g IV every 24 hours.  Objective: Filed Vitals:   08/24/14 1400  BP: 135/86  Pulse: 82  Temp: 98.2 F (36.8 C)  Resp: 18    Intake/Output Summary (Last 24 hours) at 08/24/14 1604 Last data filed at 08/24/14 1400  Gross per 24 hour  Intake   1140 ml  Output   1300 ml  Net   -160 ml   Filed Weights   08/24/14 0127  Weight: 137.576 kg (303 lb 4.8 oz)    Exam:   General:  Patient states feeling little better, she is awake, alert,  tolerating by mouth intake  Cardiovascular: Regular rate and rhythm normal S1-S2 no murmurs rubs or gallops  Respiratory: Normal sensorium effort, lungs are clear to auscultation bilaterally  Abdomen: Soft nontender nontender positive bowel sounds  Musculoskeletal: Positive costovertebral tenderness on right side, there is right flank pain with palpation  Data Reviewed: Basic Metabolic Panel:  Recent Labs Lab 08/18/14 0012 08/23/14 1930 08/24/14 0512  NA 139 139 139  K 3.7 4.1 4.2  CL 106 105 108  CO2 24 23 24   GLUCOSE 110* 107* 99  BUN 11 12 12   CREATININE 0.52 0.58 0.53  CALCIUM 9.2 9.4 8.4  MG  --   --  1.9   Liver Function Tests:  Recent Labs Lab 08/18/14 0012 08/24/14 0512  AST 28 24  ALT 24 26  ALKPHOS 103 85  BILITOT 0.2* 0.2*  PROT 7.7 6.5  ALBUMIN 4.4 3.7    Recent Labs Lab 08/18/14 0012  LIPASE 24   No results for input(s): AMMONIA in the last 168 hours. CBC:  Recent Labs Lab 08/18/14 0012 08/23/14 1930 08/24/14 0512  WBC 13.8* 10.2 9.8  NEUTROABS 10.0* 7.3 5.9  HGB 12.4 12.8 10.8*  HCT 38.0 40.4 33.2*  MCV 82.3 82.8 82.8  PLT 310 384 297   Cardiac Enzymes: No results for input(s): CKTOTAL, CKMB, CKMBINDEX, TROPONINI in the last 168 hours. BNP (last 3 results) No results for input(s): BNP in the last 8760 hours.  ProBNP (last 3 results) No results for input(s): PROBNP in the last 8760 hours.  CBG: No results for input(s): GLUCAP in the last 168 hours.  Recent Results (from the past 240 hour(s))  Urine culture     Status: None   Collection Time: 08/17/14 10:31 PM  Result Value Ref Range Status   Specimen Description URINE, RANDOM  Final   Special Requests NONE  Final   Colony Count   Final    50,000 COLONIES/ML Performed at [facility]    Culture   Final    Multiple bacterial morphotypes present, none predominant. Suggest appropriate recollection if clinically indicated. Performed at [facility]     Report Status 08/19/2014 FINAL  Final     Studies: No results found.  Scheduled Meds: . amLODipine  5 mg Oral Daily  . aspirin EC  81 mg Oral Daily  . cefTRIAXone (ROCEPHIN)  IV  1 g Intravenous Q24H  . docusate sodium  100 mg Oral BID  . heparin  5,000 Units Subcutaneous 3 times per day  . levothyroxine  200 mcg Oral QAC breakfast  . pantoprazole  40 mg Oral Daily  . sertraline  50 mg Oral Daily   Continuous Infusions: . sodium chloride 75 mL/hr at 08/24/14 1430    Principal Problem:   Urinary tract infection Active Problems:   Acute right flank pain   Hypothyroidism   Recurrent nephrolithiasis   Essential hypertension   Polycystic ovarian syndrome   Renal cell cancer   Thyroid cancer   Urinary tract infectious disease   Renal cell carcinoma   Other specified hypothyroidism    Time spent:     Kelvin Cellar  [facility] Pager [phone]. If 7PM-7AM, please contact night-coverage at [email or web address], password [facility] 08/24/2014, 4:04 PM  LOS: 0 days

## 2014-08-24 NOTE — ED Notes (Signed)
CT Renal discontinued, pt currently rating 4/10.

## 2014-08-24 NOTE — Progress Notes (Signed)
UR completed 

## 2014-08-25 DIAGNOSIS — N3000 Acute cystitis without hematuria: Secondary | ICD-10-CM

## 2014-08-25 DIAGNOSIS — C641 Malignant neoplasm of right kidney, except renal pelvis: Secondary | ICD-10-CM

## 2014-08-25 LAB — TSH: TSH: 1.561 u[IU]/mL (ref 0.350–4.500)

## 2014-08-25 MED ORDER — DIPHENHYDRAMINE HCL 50 MG/ML IJ SOLN
25.0000 mg | Freq: Once | INTRAMUSCULAR | Status: AC
Start: 2014-08-25 — End: 2014-08-25
  Administered 2014-08-25: 25 mg via INTRAVENOUS
  Filled 2014-08-25: qty 1

## 2014-08-25 MED ORDER — DIPHENHYDRAMINE HCL 25 MG PO CAPS
25.0000 mg | ORAL_CAPSULE | ORAL | Status: DC | PRN
  Administered 2014-08-26: 25 mg via ORAL
  Filled 2014-08-25: qty 1

## 2014-08-25 NOTE — Progress Notes (Signed)
[facility] PROGRESS NOTE  Yolanda Matthews ACZ:[id number] DOB: Sep 08, 1970 DOA: 08/23/2014 PCP: Lorenza Chick, MD  Interim Summary Patient is a pleasant 44 year old female with a past medical history of recurrent UTIs, history of right renal cell carcinoma status post partial nephrectomy, history of kidney stones, admitted to the medicine service on 08/24/2014 when she presented with complaints of right flank pain. She reported that symptoms were similar to prior is episodes of kidney stones. Patient also complained of mild hematuria along with subjective fevers and chills. Urinalysis revealed the presence of many bacteria along with WBC's. Patient was started on ceftriaxone 1 g IV every 24 hours.   Assessment/Plan: 1. Nephrolithiasis -Patient with a history of nephrolithiasis presenting with right flank pain with radiation to her groin. -Will continue IV fluid hydration, narcotic analgesics for pain control, empiric IV antimicrobial therapy for urinary tract infection. -Renal ultrasound unremarkable  2.  Urinary tract infection. -UA showing the presence of many bacteria along with white blood cells. -Continue empiric IV antibiotic therapy with ceftriaxone 1 g IV every 24 hours -Anticipate transitioning to oral antibiotic therapy in a.m.  3.  Renal cell carcinoma -Status post partial right nephrectomy  Code Status: Full code Family Communication:  Disposition Plan: Continue empiric IV and robotic therapy, IV fluids, narcotic analgesics  Antibiotics:  Ceftriaxone 1 g IV every 24 hours  HPI/Subjective: Patient reports feeling better better today, she is tolerating by mouth intake, states pain symptoms have not resolved however much improved from yesterday.  Objective: Filed Vitals:   08/25/14 1412  BP: 120/75  Pulse: 87  Temp: 98.5 F (36.9 C)  Resp: 18    Intake/Output Summary (Last 24 hours) at 08/25/14 1809 Last data filed at 08/25/14 1531  Gross per 24 hour  Intake    3300 ml  Output   2600 ml  Net    700 ml   Filed Weights   08/24/14 0127  Weight: 137.576 kg (303 lb 4.8 oz)    Exam:   General:  Patient states feeling little better, she is awake, alert, tolerating by mouth intake  Cardiovascular: Regular rate and rhythm normal S1-S2 no murmurs rubs or gallops  Respiratory: Normal inspiratory effort, lungs are clear to auscultation bilaterally  Abdomen: Soft nontender nontender positive bowel sounds  Musculoskeletal: Improved costovertebral tenderness on right side, along with improvement to right flank tenderness with palpation  Data Reviewed: Basic Metabolic Panel:  Recent Labs Lab 08/23/14 1930 08/24/14 0512  NA 139 139  K 4.1 4.2  CL 105 108  CO2 23 24  GLUCOSE 107* 99  BUN 12 12  CREATININE 0.58 0.53  CALCIUM 9.4 8.4  MG  --  1.9   Liver Function Tests:  Recent Labs Lab 08/24/14 0512  AST 24  ALT 26  ALKPHOS 85  BILITOT 0.2*  PROT 6.5  ALBUMIN 3.7   No results for input(s): LIPASE, AMYLASE in the last 168 hours. No results for input(s): AMMONIA in the last 168 hours. CBC:  Recent Labs Lab 08/23/14 1930 08/24/14 0512  WBC 10.2 9.8  NEUTROABS 7.3 5.9  HGB 12.8 10.8*  HCT 40.4 33.2*  MCV 82.8 82.8  PLT 384 297   Cardiac Enzymes: No results for input(s): CKTOTAL, CKMB, CKMBINDEX, TROPONINI in the last 168 hours. BNP (last 3 results) No results for input(s): BNP in the last 8760 hours.  ProBNP (last 3 results) No results for input(s): PROBNP in the last 8760 hours.  CBG: No results for input(s): GLUCAP in the last  168 hours.  Recent Results (from the past 240 hour(s))  Urine culture     Status: None   Collection Time: 08/17/14 10:31 PM  Result Value Ref Range Status   Specimen Description URINE, RANDOM  Final   Special Requests NONE  Final   Colony Count   Final    50,000 COLONIES/ML Performed at [facility]    Culture   Final    Multiple bacterial morphotypes present, none  predominant. Suggest appropriate recollection if clinically indicated. Performed at [facility]    Report Status 08/19/2014 FINAL  Final  Urine culture     Status: None   Collection Time: 08/23/14  9:05 PM  Result Value Ref Range Status   Specimen Description URINE, CLEAN CATCH  Final   Special Requests NONE  Final   Colony Count NO GROWTH Performed at [facility]   Final   Culture NO GROWTH Performed at [facility]   Final   Report Status 08/24/2014 FINAL  Final     Studies: US Renal  08/24/2014   CLINICAL DATA:  Right flank pain and hematuria for 1 week. History of a partial right nephrectomy in 2011.  EXAM: RENAL/URINARY TRACT ULTRASOUND COMPLETE  COMPARISON:  CT scan 06/29/2014  FINDINGS: Right Kidney:  Length: 12.1 cm. Normal renal cortical thickness and echogenicity without focal lesions or hydronephrosis.  Left Kidney:  Length: 13.9 cm. Normal renal cortical thickness and echogenicity without focal lesions or hydronephrosis.  Bladder:  Normal.  IMPRESSION: Normal renal ultrasound examination.   Electronically Signed   By: Marijo Sanes M.D.   On: 08/24/2014 16:21    Scheduled Meds: . amLODipine  5 mg Oral Daily  . aspirin EC  81 mg Oral Daily  . cefTRIAXone (ROCEPHIN)  IV  1 g Intravenous Q24H  . docusate sodium  100 mg Oral BID  . heparin  5,000 Units Subcutaneous 3 times per day  . levothyroxine  200 mcg Oral QAC breakfast  . pantoprazole  40 mg Oral Daily  . sertraline  50 mg Oral Daily   Continuous Infusions: . sodium chloride 75 mL/hr (08/25/14 0341)    Principal Problem:   Urinary tract infection Active Problems:   Acute right flank pain   Hypothyroidism   Recurrent nephrolithiasis   Essential hypertension   Polycystic ovarian syndrome   Renal cell cancer   Thyroid cancer   Urinary tract infectious disease   Renal cell carcinoma   Other specified hypothyroidism    Time spent: 25 minutes    Kelvin Cellar  [facility] Pager [phone]. If 7PM-7AM, please contact night-coverage at [email or web address], password [facility] 08/25/2014, 6:09 PM  LOS: 1 day

## 2014-08-26 ENCOUNTER — Inpatient Hospital Stay (HOSPITAL_COMMUNITY): Payer: 59

## 2014-08-26 DIAGNOSIS — N3001 Acute cystitis with hematuria: Secondary | ICD-10-CM

## 2014-08-26 DIAGNOSIS — R319 Hematuria, unspecified: Secondary | ICD-10-CM

## 2014-08-26 DIAGNOSIS — N200 Calculus of kidney: Secondary | ICD-10-CM

## 2014-08-26 MED ORDER — OXYCODONE-ACETAMINOPHEN 5-325 MG PO TABS: 1.0000 | ORAL_TABLET | Freq: Four times a day (QID) | ORAL | Status: DC | PRN

## 2014-08-26 MED ORDER — IBUPROFEN 200 MG PO TABS: 600.0000 mg | ORAL_TABLET | Freq: Four times a day (QID) | ORAL | Status: DC | PRN

## 2014-08-26 MED ORDER — SENNA 8.6 MG PO TABS
2.0000 | ORAL_TABLET | Freq: Every day | ORAL | Status: DC
Start: 2014-08-26 — End: 2020-09-04

## 2014-08-26 MED ORDER — CEFUROXIME AXETIL 500 MG PO TABS: 500.0000 mg | ORAL_TABLET | Freq: Two times a day (BID) | ORAL | Status: DC

## 2014-08-26 MED ORDER — POLYETHYLENE GLYCOL 3350 17 G PO PACK: 17.0000 g | PACK | Freq: Two times a day (BID) | ORAL | Status: DC

## 2014-08-26 MED ORDER — CEFUROXIME AXETIL 500 MG PO TABS
500.0000 mg | ORAL_TABLET | Freq: Two times a day (BID) | ORAL | Status: DC
Start: 2014-08-26 — End: 2014-08-26
  Administered 2014-08-26: 500 mg via ORAL
  Filled 2014-08-26 (×4): qty 1

## 2014-08-26 NOTE — Discharge Summary (Signed)
Physician Discharge Summary  Yolanda Matthews WLN:[id number] DOB: 11-Jun-1970 DOA: 08/23/2014  PCP: Lorenza Chick, MD  Admit date: 08/23/2014 Discharge date: 08/26/2014  Time spent: Less than 30 minutes  Recommendations for Outpatient Follow-up:  1. Dr. Bethann Goo, Urology: Patient has an appointment to be seen on 09/03/14. Advised patient to call for an earlier appointment if possible. 2. Dr. Gwenlyn Fudge, PCP in 3-5 days with repeat labs (CBC).  Discharge Diagnoses:  Principal Problem:   Urinary tract infection Active Problems:   Acute right flank pain   Hypothyroidism   Recurrent nephrolithiasis   Essential hypertension   Polycystic ovarian syndrome   Renal cell cancer   Thyroid cancer   Urinary tract infectious disease   Renal cell carcinoma   Other specified hypothyroidism   Hematuria   Kidney stones   Discharge Condition: Improved & Stable  Diet recommendation: Heart healthy diet.  Filed Weights   08/24/14 0127  Weight: 137.576 kg (303 lb 4.8 oz)    History of present illness:  44 year old female patient with history of right renal cell cancer, status post right partial nephrectomy, hypothyroidism, nephrolithiasis, recurrent UTIs, essential hypertension, depression, recently seen at [facility] for right flank pain and hematuria (KUB and abdominal ultrasound negative), presented to ED on 08/18/2014 with right flank pain radiating to groin similar to prior episodes of renal colic, hematuria, urinary frequency but no urgency or dysuria. She had been seen in the ED for same symptoms on 08/18/14. No fevers were reported. She was admitted for possible recurrent nephrolithiasis/possible UTI.  Hospital Course:   Renal colic/recurrent nephrolithiasis - Initially felt to have urinary tract infection. - Empirically started on IV Rocephin. - Subsequent urine culture negative. - Treated supportively with pain management and IV fluid hydration. - Recent Abdominal US 3/29 &  Renal ultrasound 4/4: No acute abnormality. Patient declined CT abdomen due to concern for radiation exposure (apparently has had multiple imaging studies for her recurrent nephrolithiasis) - KUB: No acute abnormality but showed constipation. - Improved. Hematuria and pain significantly decreased. - Patient has a follow-up appointment with her primary urologist and was advised to contact them for an earlier appointment. - She was discharged on oral Ceftin to complete total 7 days antibiotics. - Urine Pregnancy test: Negative.  Constipation - Offered Dulcolax suppository or Fleet enema prior to discharge which patient declined and wished to try MiraLAX and senna at home.  Essential hypertension - Mildly uncontrolled. - Continue amlodipine and metoprolol.  Hypothyroidism - Continue Synthroid - TSH: 1.561  History of renal cell carcinoma - Status post right partial nephrectomy - Abdominal ultrasound without acute findings.  Anemia - ? Dilutional - follow up CBC in next few days with PCP    Consultations:  None  Procedures:  None    Discharge Exam:  Complaints:  Feels much better- mild intermittent R flank to groin pain which is now only soreness. No fevers/chills. Hematuria decreasing. Urinary frequency but no urgency or dysuria. Tolerating diet without nausea or vomiting. Last BM pta. Flatus+  Filed Vitals:   08/25/14 1412 08/25/14 2200 08/26/14 0200 08/26/14 0600  BP: 120/75 131/77 173/63 130/88  Pulse: 87 81 70 79  Temp: 98.5 F (36.9 C) 98.6 F (37 C) 98.4 F (36.9 C) 98 F (36.7 C)  TempSrc: Oral Oral Oral Oral  Resp: 18 18 18 18   Height:      Weight:      SpO2: 98% 100% 96% 100%    General exam: Pleasant young female sitting comfortably  in chair. Respiratory system: Clear. No increased work of breathing. Cardiovascular system: S1 & S2 heard, RRR. No JVD, murmurs, gallops, clicks or pedal edema. Gastrointestinal system: Abdomen is nondistended, soft and  nontender. Normal bowel sounds heard. Central nervous system: Alert and oriented. No focal neurological deficits. Extremities: Symmetric 5 x 5 power.  Discharge Instructions      Discharge Instructions    Call MD for:  extreme fatigue    Complete by:  As directed      Call MD for:  persistant dizziness or light-headedness    Complete by:  As directed      Call MD for:  persistant nausea and vomiting    Complete by:  As directed      Call MD for:  severe uncontrolled pain    Complete by:  As directed      Call MD for:  temperature >100.4    Complete by:  As directed      Diet - low sodium heart healthy    Complete by:  As directed      Increase activity slowly    Complete by:  As directed             Medication List    STOP taking these medications        acetaminophen 500 MG tablet  Commonly known as:  TYLENOL     cephALEXin 500 MG capsule  Commonly known as:  KEFLEX     HYDROcodone-acetaminophen 5-325 MG per tablet  Commonly known as:  NORCO/VICODIN     ondansetron 4 MG tablet  Commonly known as:  ZOFRAN     tamsulosin 0.4 MG Caps capsule  Commonly known as:  FLOMAX      TAKE these medications        amLODipine 5 MG tablet  Commonly known as:  NORVASC  Take 5 mg by mouth daily.     cefUROXime 500 MG tablet  Commonly known as:  CEFTIN  Take 1 tablet (500 mg total) by mouth 2 (two) times daily with a meal.     FISH OIL PO  Take 1 tablet by mouth daily.     furosemide 40 MG tablet  Commonly known as:  LASIX  Take 40 mg by mouth daily.     ibuprofen 200 MG tablet  Commonly known as:  ADVIL,MOTRIN  Take 3 tablets (600 mg total) by mouth every 6 (six) hours as needed for fever or mild pain.     levothyroxine 200 MCG tablet  Commonly known as:  SYNTHROID, LEVOTHROID  Take 200 mcg by mouth daily before breakfast.     metoprolol tartrate 25 MG tablet  Commonly known as:  LOPRESSOR  Take 25 mg by mouth 2 (two) times daily.     multivitamin with  minerals Tabs tablet  Take 1 tablet by mouth daily.     omeprazole 20 MG capsule  Commonly known as:  PRILOSEC  Take 20 mg by mouth daily.     ondansetron 8 MG disintegrating tablet  Commonly known as:  ZOFRAN ODT  Take 1 tablet (8 mg total) by mouth every 8 (eight) hours as needed for nausea or vomiting.     oxyCODONE-acetaminophen 5-325 MG per tablet  Commonly known as:  PERCOCET  Take 1-2 tablets by mouth every 6 (six) hours as needed for moderate pain or severe pain.     polyethylene glycol packet  Commonly known as:  MIRALAX  Take 17 g by mouth 2 (two)  times daily.     senna 8.6 MG Tabs tablet  Commonly known as:  SENOKOT  Take 2 tablets (17.2 mg total) by mouth daily. Stop if having diarrhea.     sertraline 50 MG tablet  Commonly known as:  ZOLOFT  Take 50 mg by mouth daily.     VITAMIN D PO  Take 1 tablet by mouth daily.     VITAMIN E PO  Take 1 tablet by mouth daily.       Follow-up Information    Follow up with Bethann Goo, MD On 09/03/2014.   Specialty:  Urology   Why:  Call office for early appointment if possible.   Contact information:   [place] [address] [phone]       Follow up with PEDLEY,CAROLYN F, MD. Schedule an appointment as soon as possible for a visit in 3 days.   Specialty:  Internal Medicine   Why:  3-5 days with repeat labs (CBC). Hsopital follow up.       The results of significant diagnostics from this hospitalization (including imaging, microbiology, ancillary and laboratory) are listed below for reference.    Significant Diagnostic Studies: Dg Abd 1 View  08/26/2014   CLINICAL DATA:  Right lower quadrant pain.  EXAM: ABDOMEN - 1 VIEW  COMPARISON:  Ultrasound 08/24/2014.  CT 06/04/2014.  FINDINGS: Soft tissue structures are unremarkable. No bowel distention or free air. Stool noted in colon. Calcified pelvic densities consistent phleboliths. No acute bony abnormality.  IMPRESSION: No acute abnormality.  Moderate amount of stool noted throughout the colon. No bowel distention.   Electronically Signed   By: Marcello Moores  Register   On: 08/26/2014 11:47   US Abdomen Complete  08/18/2014   CLINICAL DATA:  Acute onset of right lower quadrant pain and right flank pain. Initial encounter.  EXAM: ULTRASOUND ABDOMEN COMPLETE  COMPARISON:  Renal ultrasound performed 06/26/2014, and CT of the abdomen and pelvis performed 06/29/2014  FINDINGS: Gallbladder: No gallstones or wall thickening visualized. No sonographic Murphy sign noted.  Common bile duct: Diameter: 0.5 cm, within normal limits in caliber.  Liver: No focal lesion identified. Diffusely increased parenchymal echogenicity and coarsened echotexture, compatible with fatty infiltration.  IVC: No abnormality visualized.  Pancreas: Visualized portion unremarkable.  Spleen: Size and appearance within normal limits.  Right Kidney: Length: 12.0 cm. Echogenicity within normal limits. Status post resection of the lower pole of the right kidney. No mass or hydronephrosis visualized.  Left Kidney: Length: 15.2 cm. Echogenicity within normal limits. No mass or hydronephrosis visualized.  Abdominal aorta: No aneurysm visualized.  Other findings: None.  IMPRESSION: 1. No acute abnormality seen within the abdomen. 2. Diffuse fatty infiltration within the liver.   Electronically Signed   By: Garald Balding M.D.   On: 08/18/2014 02:44   US Renal  08/24/2014   CLINICAL DATA:  Right flank pain and hematuria for 1 week. History of a partial right nephrectomy in 2011.  EXAM: RENAL/URINARY TRACT ULTRASOUND COMPLETE  COMPARISON:  CT scan 06/29/2014  FINDINGS: Right Kidney:  Length: 12.1 cm. Normal renal cortical thickness and echogenicity without focal lesions or hydronephrosis.  Left Kidney:  Length: 13.9 cm. Normal renal cortical thickness and echogenicity without focal lesions or hydronephrosis.  Bladder:  Normal.  IMPRESSION: Normal renal ultrasound examination.   Electronically Signed    By: Marijo Sanes M.D.   On: 08/24/2014 16:21    Microbiology: Recent Results (from the past 240 hour(s))  Urine culture  Status: None   Collection Time: 08/17/14 10:31 PM  Result Value Ref Range Status   Specimen Description URINE, RANDOM  Final   Special Requests NONE  Final   Colony Count   Final    50,000 COLONIES/ML Performed at [facility]    Culture   Final    Multiple bacterial morphotypes present, none predominant. Suggest appropriate recollection if clinically indicated. Performed at [facility]    Report Status 08/19/2014 FINAL  Final  Urine culture     Status: None   Collection Time: 08/23/14  9:05 PM  Result Value Ref Range Status   Specimen Description URINE, CLEAN CATCH  Final   Special Requests NONE  Final   Colony Count NO GROWTH Performed at [facility]   Final   Culture NO GROWTH Performed at [facility]   Final   Report Status 08/24/2014 FINAL  Final     Labs: Basic Metabolic Panel:  Recent Labs Lab 08/23/14 1930 08/24/14 0512  NA 139 139  K 4.1 4.2  CL 105 108  CO2 23 24  GLUCOSE 107* 99  BUN 12 12  CREATININE 0.58 0.53  CALCIUM 9.4 8.4  MG  --  1.9   Liver Function Tests:  Recent Labs Lab 08/24/14 0512  AST 24  ALT 26  ALKPHOS 85  BILITOT 0.2*  PROT 6.5  ALBUMIN 3.7   No results for input(s): LIPASE, AMYLASE in the last 168 hours. No results for input(s): AMMONIA in the last 168 hours. CBC:  Recent Labs Lab 08/23/14 1930 08/24/14 0512  WBC 10.2 9.8  NEUTROABS 7.3 5.9  HGB 12.8 10.8*  HCT 40.4 33.2*  MCV 82.8 82.8  PLT 384 297   Cardiac Enzymes: No results for input(s): CKTOTAL, CKMB, CKMBINDEX, TROPONINI in the last 168 hours. BNP: BNP (last 3 results) No results for input(s): BNP in the last 8760 hours.  ProBNP (last 3 results) No results for input(s): PROBNP in the last 8760 hours.  CBG: No results for input(s): GLUCAP in the last 168  hours.     Signed:  Vernell Leep, MD, FACP, FHM. [facility] Pager [phone]  If 7PM-7AM, please contact night-coverage [email or web address] Password [email or web address] 08/26/2014, 12:10 PM

## 2014-08-26 NOTE — Discharge Instructions (Signed)

## 2014-09-11 NOTE — H&P (Signed)
PATIENT NAME:  Yolanda Matthews, BRAMBLETT MR#:  [id number] DATE OF BIRTH:  1970-12-23  DATE OF ADMISSION:  01/12/2013  PRIMARY CARE PHYSICIAN:  Nonlocal.   REFERRING PHYSICIAN:  Dr. Jasmine December.   CHIEF COMPLAINT:  Right-sided flank pain associated with nausea, vomiting and urinary frequency.   HISTORY OF PRESENT ILLNESS:  The patient is a 44 year old Caucasian female who is obese is presenting to the ER with a chief complaint of sudden onset of right-sided flank pain at 4:30 p.m. today.  This was associated with nausea, vomiting and frequent urination for the past few days.  She also noticed pinkish urine.  The patient comes to the ER.  In the ER, her urine is positive for blood, but leukocyte esterase and nitrite are negative.  CT of the abdomen and pelvis has revealed pyelonephritis.  The patient has received IV Rocephin and IV Dilaudid x 3 for the severe right flank pain and hospitalist team is called to admit the patient for clinical acute pyelonephritis.  During my examination, the patient is still complaining of nausea and vomiting.  Denies any abdominal pain, chest pain or shortness of breath.  No similar complaints in the past.    PAST MEDICAL HISTORY:  Renal cell cancer, renal stones, GERD, hypertension.   PAST SURGICAL HISTORY:  Open partial nephrectomy for renal cell cancer.   HOME MEDICATIONS:  Vitamin D3, metoprolol 25 mg 2 times a day, levothyroxine 150 mcg by mouth once daily, hydrochlorothiazide 25 mg 1 tablet by mouth once daily.  ALLERGIES:  DEMEROL, BUT COULD TOLERATE PERCOCET AND OTHER NARCOTICS AS REPORTED BY THE PATIENT.   PSYCHOSOCIAL HISTORY:  Lives with husband and three children.  Denies smoking, alcohol or illicit drug use.   FAMILY HISTORY:  Mother has history of hypertension, diabetes mellitus.  Dad, hypertension and prostate cancer.   REVIEW OF SYSTEMS: CONSTITUTIONAL:  Denies any fever, fatigue.  EYES:  Denies blurry vision, glaucoma.  EARS, NOSE, THROAT:  No epistaxis or  discharge.  RESPIRATION:  No cough, COPD.  CARDIOVASCULAR:  No chest pain, syncope.  GASTROINTESTINAL:  Denies any hematemesis, but has nausea, vomiting.  No diarrhea.  Denies any abdominal pain.  GENITOURINARY:  Frequent urination and hematuria.  No dysuria.  GYNECOLOGIC AND BREAST:  Denies breast mass or vaginal discharge.  ENDOCRINE:  No polyuria, nocturia, thyroid problems.  HEMATOLOGIC AND LYMPHATIC:  No anemia, easy bruising, bleeding.  INTEGUMENTARY:  No acne, rash, lesions.  MUSCULOSKELETAL:  Complaining of right flank pain.  Denies gout.  NEUROLOGIC:  No vertigo, ataxia. PSYCHIATRIC:  No ADD or OCD.   PHYSICAL EXAMINATION: VITAL SIGNS:  Temperature is 97.8, pulse 90, respirations 20, blood pressure 150/80, pulse ox 100% on room air.  GENERAL APPEARANCE:  Not under acute distress, obese lady.   HEENT:  Normocephalic, atraumatic.  Pupils are equal, react to light and accommodation.  No scleral icterus.  No sinus tenderness.  No postnasal drip.  NECK:  Supple.  No JVD.  No thyromegaly.  Range of motion is intact.  LUNGS:  Clear to auscultation bilaterally.  No accessory muscle usage.  No anterior chest wall tenderness on palpation.  CARDIAC:  S1, S2 normal.  Regular rate and rhythm.  No murmurs.  GASTROINTESTINAL:  Soft, obese.  Bowel sounds are positive in all four quadrants.  Nontender, nondistended.  No hepatosplenomegaly.  NEUROLOGIC:  Awake, alert, oriented x 3.  Motor and sensory grossly intact.  Reflexes are 2+. BACK:  Right-sided positive flank tenderness is present.   EXTREMITIES:  No  edema.  No cyanosis.  No clubbing.  SKIN:  Normal turgor.  No rashes.  No lesions.  Warm to touch.  MUSCULOSKELETAL:  No joint effusion, tenderness, erythema.   LABORATORY AND IMAGING STUDIES:  CAT scan of the abdomen and pelvis without contrast has revealed mild swelling of the right kidney, pyelonephritis could present in this fashion.  Tiny nonobstructing right renal calculus stone.  No  evidence of hydronephrosis or obstructing ureteral stone, 2.8 cm simple left adnexal cyst is present.  Urinalysis yellow in color, hazy in appearance.  Glucose, bili, ketones are negative, blood 3+, protein 25 mg/dL, nitrite and leukocyte esterase are negative.  CBC is normal except white count which is elevated at 14.1.  BMP is normal.   ASSESSMENT AND PLAN:  A 44 year old Caucasian female presenting to the ER with a chief complaint of right-sided flank pain associated with nausea, vomiting and frequent urination will be admitted with the following assessment and plan.  1.  Acute pyelonephritis.  We will put her on IV Rocephin.  We will keep her nothing by mouth for intractable nausea, vomiting and provide IV fluids for dehydration.  Urine culture and sensitivity is ordered.  2.  Hypertension.  The patient is nothing by mouth.  We will give her IV antihypertensive as needed basis.   3.  History of renal cell cancer, status post open partial nephrectomy.  4.  Obesity and diet and exercise is advised.  5.  Hematuria, most probably from acute pyelonephritis.  6.  CODE STATUS:  SHE IS FULL CODE.  Husband is medical power of attorney.   7.  We will provide her GI and DVT prophylaxis.   Total time spent on admission is 45 minutes.   Diagnosis and plan of care was discussed in detail with the patient.  She verbalized understanding of the plan.    ____________________________ Nicholes Mango, MD ag:ea D: 01/12/2013 01:07:01 ET T: 01/12/2013 02:34:26 ET JOB#: [id number]  cc: Nicholes Mango, MD, <Dictator> Nicholes Mango MD ELECTRONICALLY SIGNED 01/14/2013 6:59

## 2014-09-11 NOTE — Discharge Summary (Signed)
PATIENT NAME:  Yolanda Matthews, Yolanda Matthews MR#:  [id number] DATE OF BIRTH:  07-23-1970  DATE OF ADMISSION:  01/12/2013 DATE OF DISCHARGE:  01/13/2013  ADMITTING DIAGNOSES: Acute pyelonephritis.  DISCHARGE DIAGNOSES:  1. Acute pyelonephritis. 2. Systemic inflammatory response reaction due to pyelonephritis. 3. Kidney stones. 4. Hematuria likely due to pyelonephritis as well as kidney stones. 5. Nausea, vomiting due to pyelonephritis, resolved. 6. History of right partial nephrectomy due to renal cell carcinoma.  7. History of hyperlipidemia, hypothyroidism, hypertension as well as obesity.   DISCHARGE CONDITION: Stable.  DISCHARGE MEDICATIONS: The patient is to continue: 1. Levothyroxine 150 mcg p.o. daily.  2. Metoprolol 25 mg p.o. twice daily.  3. Omega-3 polyunsaturated fatty acids 1 gram twice daily.  4. Vitamin D3 unknown dose orally once daily.  5. Acetaminophen/hydrocodone 325/5 mg 1 tablet every 4 hours as needed.  6. Keflex 500 mg p.o. 3 times daily for 5 more days to complete 7-day course.  7. Orphenadrine which is Norflex 100 mg p.o. twice daily.  8. Omeprazole 20 mg p.o. once daily. The patient was advised to stop HCTZ unless recommended by PCP.   HOME OXYGEN: None.   DIET: 2 grams salt, low fat, low cholesterol.   ACTIVITY LIMITATIONS: As tolerated.   FOLLOWUP: Appointment with PCP in 2 days after discharge as well as primary urology next week.  CONSULTANTS: Care management.   RADIOLOGIC STUDIES: CT scan of abdomen and pelvis without contrast for stone due to right flank pain on 01/11/2013 revealed mild swelling of right kidney, pyelonephritis could present in this fashion, according to radiologist. Tiny nonobstructing right renal calyceal stone was noted. No evidence of hydronephrosis or obstructive ureteral stone. A 2.8 cm simple left adnexal cyst, according to radiologist   HISTORY OF PRESENT ILLNESS: The patient is a 44 year old Caucasian female with history of right  nephrectomy due to right renal cell carcinoma, who presented to the hospital with complaints of right-sided flank pain as well as nausea, vomiting and increased urinary frequency. In the Emergency Room, her temperature was 97.8, pulse was 90, respiratory rate was 20, blood pressure 150/80, O2 saturations were 100% on room air. Physical exam did not show significant changes except of CVA tenderness on the right. The patient's lab data on 01/11/2013 showed mild elevation of BUN to 20, otherwise BMP was unremarkable. The patient's white blood cell count was elevated to 14.1, hemoglobin was 12.9, platelet count was 230. Urinalysis showed yellow hazy urine, negative for glucose, bilirubin or ketones, specific gravity was 1.015, pH was 6.0, 3+ blood, 25 mg/dL protein, negative for nitrites or leukocyte esterase, 1198 red blood cells and 3 white blood cells were noted, no bacteria, 1 epithelial cell as well as mucus was present.   HOSPITAL COURSE: The patient was admitted to the hospital for further evaluation. She was started on Rocephin. With this therapy as well as IV fluid administration and pain medications as well as Norflex,  her condition somewhat improved. She was able to take good p.o. with no nausea or vomiting, and her increased frequency of urination somewhat also subsided. She still had pains in her right flank; however, those were improving. She was advised to continue antibiotic therapy for 5 more days to complete 7-day course. Her urine cultures were checked; however, they were negative. It was felt that the patient's hematuria could have been related to acute pyelonephritis; however, the patient was advised to follow up with her urologist and recheck CT scan of her kidneys or ultrasound of her kidneys,  depending upon their recommendations, after inflammation subsides. It was also felt that the patient's hematuria could have been related to pyelonephritis as well as kidney stones. For this reason, the  patient was advised to follow up with her primary urology at [facility].   The patient's hemoglobin was checked on the day of discharge, 01/13/2013, and with hydration, the patient's hemoglobin level drifted down to 11.5, which is approximately 1 gram drop in 2 days. It is recommended to follow the patient's red blood cell count as well as hemoglobin levels as outpatient to make sure that hemoglobin level does not drift down significantly.   In regards to white blood cell count, as mentioned above, the patient's white blood cell count was elevated on admission at 14.1. Post antibiotic therapy, the patient's white blood cell count normalized to 9.8 by the 01/13/2013.   In regards to nausea and vomiting, the patient's nausea and vomiting resolved with IV fluids as well as antiemetics.   In regards to hyperlipidemia, hypothyroidism as well as hypertension, the patient is to continue her outpatient management. The patient's TSH was checked, however, prior to leaving the hospital, and was found to be markedly elevated at 10.6. It is recommended to have the patient's TSH rechecked as outpatient when her inflammation subsides as this could be euthyroid.   The patient is being discharged in stable condition with above-mentioned medications and followup. The patient's vital signs on the day of discharge: Temperature 98.8, pulse was 87, respiratory rate was 18, blood pressure 133/83, saturation was 95% to 99% on room air at rest.   TIME SPENT: 40 minutes.  ____________________________ Theodoro Grist, MD rv:OSi D: 01/13/2013 18:57:59 ET T: 01/14/2013 06:27:27 ET JOB#: [id number]  cc: Theodoro Grist, MD, <Dictator> Primary Care Physician [facility] SIGNED 02/08/2013 17:33

## 2014-09-29 ENCOUNTER — Emergency Department (HOSPITAL_COMMUNITY)
Admission: EM | Admit: 2014-09-29 | Discharge: 2014-09-29 | Disposition: A | Discharge status: Home / Self Care | Payer: 59 | Attending: Emergency Medicine | Admitting: Emergency Medicine

## 2014-09-29 ENCOUNTER — Encounter (HOSPITAL_COMMUNITY): Payer: Self-pay

## 2014-09-29 DIAGNOSIS — Z9889 Other specified postprocedural states: Secondary | ICD-10-CM | POA: Insufficient documentation

## 2014-09-29 DIAGNOSIS — N200 Calculus of kidney: Secondary | ICD-10-CM

## 2014-09-29 DIAGNOSIS — R112 Nausea with vomiting, unspecified: Secondary | ICD-10-CM

## 2014-09-29 DIAGNOSIS — Z79899 Other long term (current) drug therapy: Secondary | ICD-10-CM | POA: Insufficient documentation

## 2014-09-29 DIAGNOSIS — E079 Disorder of thyroid, unspecified: Secondary | ICD-10-CM | POA: Insufficient documentation

## 2014-09-29 DIAGNOSIS — R319 Hematuria, unspecified: Secondary | ICD-10-CM

## 2014-09-29 DIAGNOSIS — Z85850 Personal history of malignant neoplasm of thyroid: Secondary | ICD-10-CM | POA: Insufficient documentation

## 2014-09-29 DIAGNOSIS — Z85528 Personal history of other malignant neoplasm of kidney: Secondary | ICD-10-CM | POA: Insufficient documentation

## 2014-09-29 DIAGNOSIS — Z8639 Personal history of other endocrine, nutritional and metabolic disease: Secondary | ICD-10-CM | POA: Insufficient documentation

## 2014-09-29 DIAGNOSIS — K219 Gastro-esophageal reflux disease without esophagitis: Secondary | ICD-10-CM | POA: Insufficient documentation

## 2014-09-29 DIAGNOSIS — R109 Unspecified abdominal pain: Secondary | ICD-10-CM

## 2014-09-29 DIAGNOSIS — Z90710 Acquired absence of both cervix and uterus: Secondary | ICD-10-CM | POA: Insufficient documentation

## 2014-09-29 DIAGNOSIS — I10 Essential (primary) hypertension: Secondary | ICD-10-CM | POA: Insufficient documentation

## 2014-09-29 DIAGNOSIS — R10A1 Flank pain, right side: Secondary | ICD-10-CM

## 2014-09-29 LAB — COMPREHENSIVE METABOLIC PANEL
ALK PHOS: 114 U/L (ref 38–126)
ALT: 45 U/L (ref 14–54)
AST: 41 U/L (ref 15–41)
Albumin: 4.5 g/dL (ref 3.5–5.0)
Anion gap: 13 (ref 5–15)
BILIRUBIN TOTAL: 0.5 mg/dL (ref 0.3–1.2)
BUN: 14 mg/dL (ref 6–20)
CHLORIDE: 102 mmol/L (ref 101–111)
CO2: 23 mmol/L (ref 22–32)
Calcium: 9.5 mg/dL (ref 8.9–10.3)
Creatinine, Ser: 0.68 mg/dL (ref 0.44–1.00)
GFR calc non Af Amer: >60 mL/min (ref >60)
Glucose, Bld: 130 mg/dL — ABNORMAL HIGH (ref 70–99)
POTASSIUM: 3.8 mmol/L (ref 3.5–5.1)
SODIUM: 138 mmol/L (ref 135–145)
TOTAL PROTEIN: 8.2 g/dL — AB (ref 6.5–8.1)

## 2014-09-29 LAB — CBC WITH DIFFERENTIAL/PLATELET
BASOS PCT: 0 % (ref 0–1)
Basophils Absolute: 0 10*3/uL (ref 0.0–0.1)
EOS ABS: 0.2 10*3/uL (ref 0.0–0.7)
Eosinophils Relative: 2 % (ref 0–5)
HCT: 39.5 % (ref 36.0–46.0)
Hemoglobin: 12.8 g/dL (ref 12.0–15.0)
Lymphocytes Relative: 22 % (ref 12–46)
Lymphs Abs: 2.6 10*3/uL (ref 0.7–4.0)
MCH: 26.6 pg (ref 26.0–34.0)
MCHC: 32.4 g/dL (ref 30.0–36.0)
MCV: 82.1 fL (ref 78.0–100.0)
Monocytes Absolute: 0.6 10*3/uL (ref 0.1–1.0)
Monocytes Relative: 5 % (ref 3–12)
NEUTROS PCT: 71 % (ref 43–77)
Neutro Abs: 8.1 10*3/uL — ABNORMAL HIGH (ref 1.7–7.7)
Platelets: 391 10*3/uL (ref 150–400)
RBC: 4.81 MIL/uL (ref 3.87–5.11)
RDW: 13.7 % (ref 11.5–15.5)
WBC: 11.5 10*3/uL — ABNORMAL HIGH (ref 4.0–10.5)

## 2014-09-29 LAB — URINALYSIS, ROUTINE W REFLEX MICROSCOPIC
BILIRUBIN URINE: NEGATIVE
GLUCOSE, UA: NEGATIVE mg/dL
Ketones, ur: NEGATIVE mg/dL
Nitrite: NEGATIVE
PH: 6 (ref 5.0–8.0)
Protein, ur: NEGATIVE mg/dL
Specific Gravity, Urine: 1.019 (ref 1.005–1.030)
Urobilinogen, UA: 0.2 mg/dL (ref 0.0–1.0)

## 2014-09-29 LAB — URINE MICROSCOPIC-ADD ON

## 2014-09-29 MED ORDER — KETOROLAC TROMETHAMINE 15 MG/ML IJ SOLN
15.0000 mg | Freq: Once | INTRAMUSCULAR | Status: AC
  Administered 2014-09-29: 15 mg via INTRAVENOUS
  Filled 2014-09-29: qty 1

## 2014-09-29 MED ORDER — ONDANSETRON HCL 4 MG/2ML IJ SOLN
4.0000 mg | Freq: Once | INTRAMUSCULAR | Status: AC
  Administered 2014-09-29: 4 mg via INTRAVENOUS
  Filled 2014-09-29: qty 2

## 2014-09-29 MED ORDER — HYDROMORPHONE HCL 1 MG/ML IJ SOLN
1.0000 mg | Freq: Once | INTRAMUSCULAR | Status: AC
  Administered 2014-09-29: 1 mg via INTRAVENOUS
  Filled 2014-09-29: qty 1

## 2014-09-29 MED ORDER — SODIUM CHLORIDE 0.9 % IV BOLUS (SEPSIS)
1000.0000 mL | Freq: Once | INTRAVENOUS | Status: AC
  Administered 2014-09-29: 1000 mL via INTRAVENOUS

## 2014-09-29 MED ORDER — METOCLOPRAMIDE HCL 10 MG PO TABS: 10.0000 mg | ORAL_TABLET | Freq: Four times a day (QID) | ORAL | Status: DC | PRN

## 2014-09-29 MED ORDER — HYDROCODONE-ACETAMINOPHEN 5-325 MG PO TABS: 2.0000 | ORAL_TABLET | ORAL | Status: DC | PRN

## 2014-09-29 NOTE — ED Notes (Signed)
MD at bedside. 

## 2014-09-29 NOTE — ED Notes (Signed)
Pt here 3 weeks ago.  Pt was given antibiotics for potential stone. Did not want CT so it was bypassed. Pt went home with strainer.  Since this morning, pt has had blood in urine.  Pt began straining urine and noticed several granules.  Pt states abdominal pain in rt low quad to back. N/V.  No fever.  Blood in urine

## 2014-09-29 NOTE — ED Notes (Signed)
Patient states she itching from the Toradol. No visible hives or any redness. Patient given cool wash clothes.

## 2014-09-29 NOTE — Discharge Instructions (Signed)
If you were given medicines take as directed.  If you are on coumadin or contraceptives realize their levels and effectiveness is altered by many different medicines.  If you have any reaction (rash, tongues swelling, other) to the medicines stop taking and see a physician.   For severe pain take norco or vicodin however realize they have the potential for addiction and it can make you sleepy and has tylenol in it.  No operating machinery while taking. Take antibiotics until you see culture result. Please follow up as directed and return to the ER or see a physician for new or worsening symptoms.  Thank you. Filed Vitals:   09/29/14 1610 09/29/14 1852  BP: 128/100 122/91  Pulse: 100 91  Temp: 98.7 F (37.1 C)   TempSrc: Oral   Resp: 18 17  SpO2: 99% 98%

## 2014-09-29 NOTE — ED Provider Notes (Signed)
CSN: [id number]     Arrival date & time 09/29/14  33 History   First MD Initiated Contact with Patient 09/29/14 1628     Chief Complaint  Patient presents with  . Abdominal Pain  . Flank Pain     (Consider location/radiation/quality/duration/timing/severity/associated sxs/prior Treatment) HPI Comments: 44 year old female with history of PCO S, high blood pressure, urine infections, recurrent kidney stones presents with right flank pain and nausea worsening past 24 hours. Patient has been admitted for pain control the past. This is similar to multiple kidney stones. Patient brought in multiple kidney stone she has passed recently. No fevers or chills. Patient was treated last time within a box os 1 urine culture was negative. Right flank pain mild radiation down the right groin. Hx of kidney CA, still functioning bilateral kidneys, tolerated toradol.   Patient is a 44 y.o. female presenting with abdominal pain and flank pain. The history is provided by the patient.  Abdominal Pain Associated symptoms: nausea and vomiting   Associated symptoms: no chest pain, no chills, no dysuria, no fever and no shortness of breath   Flank Pain Pertinent negatives include no chest pain, no abdominal pain, no headaches and no shortness of breath.    Past Medical History  Diagnosis Date  . Renal disorder   . Thyroid disease   . Hypertension   . Polycystic ovarian syndrome   . GERD (gastroesophageal reflux disease)   . Kidney stones   . History of kidney cancer   . Kidney stones   . Cancer     renal  . Thyroid cancer    Past Surgical History  Procedure Laterality Date  . Cesarean section    . Tonsillectomy    . Abdominal hysterectomy    . Partial nephrectomy     Family History  Problem Relation Age of Onset  . Hypertension Mother   . Diabetes Mother   . Hypertension Father   . Cancer Father    History  Substance Use Topics  . Smoking status: Never Smoker   . Smokeless tobacco: Never  Used  . Alcohol Use: No   OB History    No data available     Review of Systems  Constitutional: Positive for appetite change. Negative for fever and chills.  HENT: Negative for congestion.   Eyes: Negative for visual disturbance.  Respiratory: Negative for shortness of breath.   Cardiovascular: Negative for chest pain.  Gastrointestinal: Positive for nausea and vomiting. Negative for abdominal pain.  Genitourinary: Positive for flank pain. Negative for dysuria.  Musculoskeletal: Negative for back pain, neck pain and neck stiffness.  Skin: Negative for rash.  Neurological: Negative for light-headedness and headaches.      Allergies  Aminocaproic acid and Demerol  Home Medications   Prior to Admission medications   Medication Sig Start Date End Date Taking? Authorizing Provider  amLODipine (NORVASC) 5 MG tablet Take 5 mg by mouth daily.   Yes Historical Provider, MD  furosemide (LASIX) 40 MG tablet Take 40 mg by mouth daily.   Yes Historical Provider, MD  ibuprofen (ADVIL,MOTRIN) 200 MG tablet Take 3 tablets (600 mg total) by mouth every 6 (six) hours as needed for fever or mild pain. 08/26/14  Yes Modena Jansky, MD  levothyroxine (SYNTHROID, LEVOTHROID) 200 MCG tablet Take 200 mcg by mouth daily before breakfast.   Yes Historical Provider, MD  metoprolol tartrate (LOPRESSOR) 25 MG tablet Take 25 mg by mouth 2 (two) times daily.   Yes Historical  Provider, MD  Multiple Vitamin (MULTIVITAMIN WITH MINERALS) TABS tablet Take 1 tablet by mouth daily.   Yes Historical Provider, MD  Omega-3 Fatty Acids (FISH OIL PO) Take 1 tablet by mouth daily.   Yes Historical Provider, MD  omeprazole (PRILOSEC) 20 MG capsule Take 20 mg by mouth daily.   Yes Historical Provider, MD  ondansetron (ZOFRAN ODT) 8 MG disintegrating tablet Take 1 tablet (8 mg total) by mouth every 8 (eight) hours as needed for nausea or vomiting. 06/26/14  Yes Comer Locket, PA-C  oxyCODONE-acetaminophen (PERCOCET) 5-325  MG per tablet Take 1-2 tablets by mouth every 6 (six) hours as needed for moderate pain or severe pain. 08/26/14  Yes Modena Jansky, MD  sertraline (ZOLOFT) 50 MG tablet Take 50 mg by mouth daily.   Yes Historical Provider, MD  VITAMIN E PO Take 1 tablet by mouth daily.   Yes Historical Provider, MD  cefUROXime (CEFTIN) 500 MG tablet Take 1 tablet (500 mg total) by mouth 2 (two) times daily with a meal. Patient not taking: Reported on 09/29/2014 08/26/14   Modena Jansky, MD  HYDROcodone-acetaminophen (NORCO) 5-325 MG per tablet Take 2 tablets by mouth every 4 (four) hours as needed. 09/29/14   Elnora Morrison, MD  metoCLOPramide (REGLAN) 10 MG tablet Take 1 tablet (10 mg total) by mouth every 6 (six) hours as needed for nausea (nausea/headache). 09/29/14   Elnora Morrison, MD  polyethylene glycol [facility]) packet Take 17 g by mouth 2 (two) times daily. Patient not taking: Reported on 09/29/2014 08/26/14   Modena Jansky, MD  senna (SENOKOT) 8.6 MG TABS tablet Take 2 tablets (17.2 mg total) by mouth daily. Stop if having diarrhea. Patient not taking: Reported on 09/29/2014 08/26/14   Modena Jansky, MD   BP 122/85 mmHg  Pulse 87  Temp(Src) 98.6 F (37 C) (Oral)  Resp 16  Ht 5\' 10"  (1.778 m)  Wt 275 lb (124.739 kg)  BMI 39.46 kg/m2  SpO2 98% Physical Exam  Constitutional: She is oriented to person, place, and time. She appears well-developed and well-nourished.  HENT:  Head: Normocephalic and atraumatic.  Mild dry meters membranes  Eyes: Conjunctivae are normal. Right eye exhibits no discharge. Left eye exhibits no discharge.  Neck: Normal range of motion. Neck supple. No tracheal deviation present.  Cardiovascular: Normal rate and regular rhythm.   Pulmonary/Chest: Effort normal and breath sounds normal.  Abdominal: Soft. She exhibits no distension. There is tenderness (mild right inguinal region). There is no guarding.  Musculoskeletal: She exhibits no edema.  Neurological: She is alert and  oriented to person, place, and time.  Skin: Skin is warm. No rash noted.  Psychiatric: She has a normal mood and affect.  Nursing note and vitals reviewed.   ED Course  Procedures (including critical care time) Emergency Focused Ultrasound Exam Limited retroperitoneal ultrasound of kidneys  Performed and interpreted by Dr. Reather Converse Indication: flank pain Focused abdominal ultrasound with both kidneys imaged in transverse and longitudinal planes in real-time. Interpretation: no hydronephrosis visualized.   Images archived electronically  CPT Code: [phone] (limited retroperitoneal)  Labs Review Labs Reviewed  CBC WITH DIFFERENTIAL/PLATELET - Abnormal; Notable for the following:    WBC 11.5 (*)    Neutro Abs 8.1 (*)    All other components within normal limits  COMPREHENSIVE METABOLIC PANEL - Abnormal; Notable for the following:    Glucose, Bld 130 (*)    Total Protein 8.2 (*)    All other components within normal  limits  URINALYSIS, ROUTINE W REFLEX MICROSCOPIC - Abnormal; Notable for the following:    Color, Urine RED (*)    APPearance CLOUDY (*)    Hgb urine dipstick LARGE (*)    Leukocytes, UA SMALL (*)    All other components within normal limits  URINE CULTURE  URINE MICROSCOPIC-ADD ON    Imaging Review No results found.   EKG Interpretation None      MDM   Final diagnoses:  Acute right flank pain  Kidney stones  Hematuria  Non-intractable vomiting with nausea, vomiting of unspecified type   Patient with history of kidney stones presents with similar presentation. Patient wishes to hold on CT scan was absolutely necessary due to multiple CT scans in the past. Bedside ultrasound no hydronephrosis. Patient has hematuria as expected and visualized stones at the bedside that she brought in from home. Plan for urine culture low concern for infection at this time. Pain control.  Patient's pain controlled on recheck. Follow-up outpatient discussed. Patient  comfortable with close outpatient follow Results and differential diagnosis were discussed with the patient/parent/guardian. Close follow up outpatient was discussed, comfortable with the plan.   Medications  sodium chloride 0.9 % bolus 1,000 mL (0 mLs Intravenous Stopped 09/29/14 2041)  HYDROmorphone (DILAUDID) injection 1 mg (1 mg Intravenous Given 09/29/14 1842)  ketorolac (TORADOL) 15 MG/ML injection 15 mg (15 mg Intravenous Given 09/29/14 2047)  ondansetron (ZOFRAN) injection 4 mg (4 mg Intravenous Given 09/29/14 1857)  ondansetron (ZOFRAN) injection 4 mg (4 mg Intravenous Given 09/29/14 2047)  HYDROmorphone (DILAUDID) injection 1 mg (1 mg Intravenous Given 09/29/14 2136)    Filed Vitals:   09/29/14 1610 09/29/14 1852 09/29/14 2041  BP: 128/100 122/91 122/85  Pulse: 100 91 87  Temp: 98.7 F (37.1 C)  98.6 F (37 C)  TempSrc: Oral  Oral  Resp: 18 17 16   Height:   5\' 10"  (1.778 m)  Weight:   275 lb (124.739 kg)  SpO2: 99% 98% 98%    Final diagnoses:  Acute right flank pain  Kidney stones  Hematuria  Non-intractable vomiting with nausea, vomiting of unspecified type      Elnora Morrison, MD 09/30/14 0011

## 2014-09-30 LAB — URINE CULTURE

## 2014-11-01 ENCOUNTER — Encounter (HOSPITAL_COMMUNITY): Payer: Self-pay | Admitting: *Deleted

## 2014-11-01 ENCOUNTER — Emergency Department (HOSPITAL_COMMUNITY)
Admission: EM | Admit: 2014-11-01 | Discharge: 2014-11-02 | Disposition: A | Discharge status: Home / Self Care | Payer: 59 | Attending: Emergency Medicine | Admitting: Emergency Medicine

## 2014-11-01 DIAGNOSIS — Z87448 Personal history of other diseases of urinary system: Secondary | ICD-10-CM | POA: Insufficient documentation

## 2014-11-01 DIAGNOSIS — K219 Gastro-esophageal reflux disease without esophagitis: Secondary | ICD-10-CM | POA: Insufficient documentation

## 2014-11-01 DIAGNOSIS — E079 Disorder of thyroid, unspecified: Secondary | ICD-10-CM | POA: Insufficient documentation

## 2014-11-01 DIAGNOSIS — I10 Essential (primary) hypertension: Secondary | ICD-10-CM | POA: Insufficient documentation

## 2014-11-01 DIAGNOSIS — Z87442 Personal history of urinary calculi: Secondary | ICD-10-CM | POA: Insufficient documentation

## 2014-11-01 DIAGNOSIS — R000 Tachycardia, unspecified: Secondary | ICD-10-CM

## 2014-11-01 DIAGNOSIS — Z85850 Personal history of malignant neoplasm of thyroid: Secondary | ICD-10-CM | POA: Insufficient documentation

## 2014-11-01 DIAGNOSIS — R1031 Right lower quadrant pain: Secondary | ICD-10-CM | POA: Insufficient documentation

## 2014-11-01 DIAGNOSIS — R319 Hematuria, unspecified: Secondary | ICD-10-CM

## 2014-11-01 DIAGNOSIS — Z79899 Other long term (current) drug therapy: Secondary | ICD-10-CM | POA: Insufficient documentation

## 2014-11-01 DIAGNOSIS — Z85528 Personal history of other malignant neoplasm of kidney: Secondary | ICD-10-CM | POA: Insufficient documentation

## 2014-11-01 DIAGNOSIS — R1110 Vomiting, unspecified: Secondary | ICD-10-CM | POA: Insufficient documentation

## 2014-11-01 LAB — COMPREHENSIVE METABOLIC PANEL
ALT: 50 U/L (ref 14–54)
ANION GAP: 12 (ref 5–15)
AST: 50 U/L — ABNORMAL HIGH (ref 15–41)
Albumin: 4.3 g/dL (ref 3.5–5.0)
Alkaline Phosphatase: 104 U/L (ref 38–126)
BUN: 11 mg/dL (ref 6–20)
CALCIUM: 9.5 mg/dL (ref 8.9–10.3)
CHLORIDE: 100 mmol/L — AB (ref 101–111)
CO2: 22 mmol/L (ref 22–32)
Creatinine, Ser: 0.64 mg/dL (ref 0.44–1.00)
GFR calc Af Amer: >60 mL/min (ref >60)
GFR calc non Af Amer: >60 mL/min (ref >60)
GLUCOSE: 112 mg/dL — AB (ref 65–99)
Potassium: 3.6 mmol/L (ref 3.5–5.1)
SODIUM: 134 mmol/L — AB (ref 135–145)
Total Bilirubin: 0.5 mg/dL (ref 0.3–1.2)
Total Protein: 7.9 g/dL (ref 6.5–8.1)

## 2014-11-01 LAB — CBC WITH DIFFERENTIAL/PLATELET
BASOS ABS: 0 10*3/uL (ref 0.0–0.1)
BASOS PCT: 0 % (ref 0–1)
EOS PCT: 1 % (ref 0–5)
Eosinophils Absolute: 0.1 10*3/uL (ref 0.0–0.7)
HCT: 40.4 % (ref 36.0–46.0)
Hemoglobin: 13.4 g/dL (ref 12.0–15.0)
Lymphocytes Relative: 18 % (ref 12–46)
Lymphs Abs: 2.5 10*3/uL (ref 0.7–4.0)
MCH: 26.1 pg (ref 26.0–34.0)
MCHC: 33.2 g/dL (ref 30.0–36.0)
MCV: 78.8 fL (ref 78.0–100.0)
MONO ABS: 0.6 10*3/uL (ref 0.1–1.0)
MONOS PCT: 4 % (ref 3–12)
NEUTROS PCT: 77 % (ref 43–77)
Neutro Abs: 10.4 10*3/uL — ABNORMAL HIGH (ref 1.7–7.7)
PLATELETS: 367 10*3/uL (ref 150–400)
RBC: 5.13 MIL/uL — AB (ref 3.87–5.11)
RDW: 13.6 % (ref 11.5–15.5)
WBC: 13.6 10*3/uL — ABNORMAL HIGH (ref 4.0–10.5)

## 2014-11-01 LAB — URINALYSIS, ROUTINE W REFLEX MICROSCOPIC
Glucose, UA: NEGATIVE mg/dL
Ketones, ur: 15 mg/dL — AB
Nitrite: NEGATIVE
PH: 7.5 (ref 5.0–8.0)
Protein, ur: NEGATIVE mg/dL
Specific Gravity, Urine: 1.024 (ref 1.005–1.030)
Urobilinogen, UA: 1 mg/dL (ref 0.0–1.0)

## 2014-11-01 LAB — URINE MICROSCOPIC-ADD ON

## 2014-11-01 LAB — LIPASE, BLOOD: Lipase: 23 U/L (ref 22–51)

## 2014-11-01 MED ORDER — MORPHINE SULFATE 4 MG/ML IJ SOLN: 8.0000 mg | Freq: Once | INTRAMUSCULAR | Status: DC

## 2014-11-01 MED ORDER — SODIUM CHLORIDE 0.9 % IV BOLUS (SEPSIS): 1000.0000 mL | Freq: Once | INTRAVENOUS | Status: DC

## 2014-11-01 MED ORDER — ONDANSETRON HCL 4 MG/2ML IJ SOLN: 4.0000 mg | Freq: Once | INTRAMUSCULAR | Status: DC

## 2014-11-01 MED ORDER — ONDANSETRON 4 MG PO TBDP
8.0000 mg | ORAL_TABLET | Freq: Once | ORAL | Status: AC
  Administered 2014-11-01: 8 mg via ORAL
  Filled 2014-11-01: qty 2

## 2014-11-01 MED ORDER — OXYCODONE-ACETAMINOPHEN 5-325 MG PO TABS
1.0000 | ORAL_TABLET | Freq: Once | ORAL | Status: AC
  Administered 2014-11-01: 1 via ORAL
  Filled 2014-11-01: qty 1

## 2014-11-01 NOTE — Discharge Instructions (Signed)

## 2014-11-01 NOTE — ED Notes (Signed)
The pt is c/o not feeling well for 9 days.  She is also c/o feeling very bad tachycardia flank pain hx kidneystones and she has been taking pot citrate  For 9 days  Bloody urine

## 2014-11-01 NOTE — ED Provider Notes (Signed)
CSN: [id number]     Arrival date & time 11/01/14  1849 History   First MD Initiated Contact with Patient 11/01/14 2110     Chief Complaint  Patient presents with  . Tachycardia     (Consider location/radiation/quality/duration/timing/severity/associated sxs/prior Treatment) Patient is a 44 y.o. female presenting with vomiting. The history is provided by the patient.  Emesis Severity:  Moderate Duration:  1 day Timing:  Intermittent Number of daily episodes:  3 Quality:  Stomach contents Able to tolerate:  Liquids Progression:  Unchanged Chronicity:  New Recent urination:  Normal Relieved by:  Nothing Worsened by:  Nothing tried Ineffective treatments:  None tried Associated symptoms: abdominal pain (right flank pain intermittently with known kidney stones on right)   Associated symptoms: no fever   Risk factors comment:  Potassium citrate started 1 w/a, concerned this may be a reaction   Past Medical History  Diagnosis Date  . Renal disorder   . Thyroid disease   . Hypertension   . Polycystic ovarian syndrome   . GERD (gastroesophageal reflux disease)   . Kidney stones   . History of kidney cancer   . Kidney stones   . Cancer     renal  . Thyroid cancer    Past Surgical History  Procedure Laterality Date  . Cesarean section    . Tonsillectomy    . Abdominal hysterectomy    . Partial nephrectomy     Family History  Problem Relation Age of Onset  . Hypertension Mother   . Diabetes Mother   . Hypertension Father   . Cancer Father    History  Substance Use Topics  . Smoking status: Never Smoker   . Smokeless tobacco: Never Used  . Alcohol Use: No   OB History    No data available     Review of Systems  Gastrointestinal: Positive for vomiting and abdominal pain (right flank pain intermittently with known kidney stones on right).  All other systems reviewed and are negative.     Allergies  Aminocaproic acid and Demerol  Home Medications   Prior  to Admission medications   Medication Sig Start Date End Date Taking? Authorizing Provider  amLODipine (NORVASC) 5 MG tablet Take 5 mg by mouth daily.    Historical Provider, MD  cefUROXime (CEFTIN) 500 MG tablet Take 1 tablet (500 mg total) by mouth 2 (two) times daily with a meal. Patient not taking: Reported on 09/29/2014 08/26/14   Modena Jansky, MD  furosemide (LASIX) 40 MG tablet Take 40 mg by mouth daily.    Historical Provider, MD  HYDROcodone-acetaminophen (NORCO) 5-325 MG per tablet Take 2 tablets by mouth every 4 (four) hours as needed. 09/29/14   Elnora Morrison, MD  ibuprofen (ADVIL,MOTRIN) 200 MG tablet Take 3 tablets (600 mg total) by mouth every 6 (six) hours as needed for fever or mild pain. 08/26/14   Modena Jansky, MD  levothyroxine (SYNTHROID, LEVOTHROID) 200 MCG tablet Take 200 mcg by mouth daily before breakfast.    Historical Provider, MD  metoCLOPramide (REGLAN) 10 MG tablet Take 1 tablet (10 mg total) by mouth every 6 (six) hours as needed for nausea (nausea/headache). 09/29/14   Elnora Morrison, MD  metoprolol tartrate (LOPRESSOR) 25 MG tablet Take 25 mg by mouth 2 (two) times daily.    Historical Provider, MD  Multiple Vitamin (MULTIVITAMIN WITH MINERALS) TABS tablet Take 1 tablet by mouth daily.    Historical Provider, MD  Omega-3 Fatty Acids (FISH OIL PO)  Take 1 tablet by mouth daily.    Historical Provider, MD  omeprazole (PRILOSEC) 20 MG capsule Take 20 mg by mouth daily.    Historical Provider, MD  ondansetron (ZOFRAN ODT) 8 MG disintegrating tablet Take 1 tablet (8 mg total) by mouth every 8 (eight) hours as needed for nausea or vomiting. 06/26/14   Comer Locket, PA-C  oxyCODONE-acetaminophen (PERCOCET) 5-325 MG per tablet Take 1-2 tablets by mouth every 6 (six) hours as needed for moderate pain or severe pain. 08/26/14   Modena Jansky, MD  polyethylene glycol [facility]) packet Take 17 g by mouth 2 (two) times daily. Patient not taking: Reported on 09/29/2014 08/26/14    Modena Jansky, MD  senna (SENOKOT) 8.6 MG TABS tablet Take 2 tablets (17.2 mg total) by mouth daily. Stop if having diarrhea. Patient not taking: Reported on 09/29/2014 08/26/14   Modena Jansky, MD  sertraline (ZOLOFT) 50 MG tablet Take 50 mg by mouth daily.    Historical Provider, MD  VITAMIN E PO Take 1 tablet by mouth daily.    Historical Provider, MD   BP 160/90 mmHg  Pulse 101  Temp(Src) 98.6 F (37 C)  Resp 19  Ht 5\' 10"  (1.778 m)  Wt 280 lb (127.007 kg)  BMI 40.18 kg/m2  SpO2 98% Physical Exam  Constitutional: She is oriented to person, place, and time. She appears well-developed and well-nourished. No distress.  HENT:  Head: Normocephalic.  Eyes: Conjunctivae are normal.  Neck: Neck supple. No tracheal deviation present.  Cardiovascular: Normal rate and regular rhythm.   Pulmonary/Chest: Effort normal. No respiratory distress.  Abdominal: Soft. She exhibits no distension. There is tenderness (and right flank) in the right lower quadrant.  Neurological: She is alert and oriented to person, place, and time.  Skin: Skin is warm and dry.  Psychiatric: She has a normal mood and affect.    ED Course  Procedures (including critical care time) Labs Review Labs Reviewed  CBC WITH DIFFERENTIAL/PLATELET - Abnormal; Notable for the following:    WBC 13.6 (*)    RBC 5.13 (*)    Neutro Abs 10.4 (*)    All other components within normal limits  COMPREHENSIVE METABOLIC PANEL - Abnormal; Notable for the following:    Sodium 134 (*)    Chloride 100 (*)    Glucose, Bld 112 (*)    AST 50 (*)    All other components within normal limits  URINALYSIS, ROUTINE W REFLEX MICROSCOPIC (NOT AT [facility]) - Abnormal; Notable for the following:    Color, Urine AMBER (*)    APPearance CLOUDY (*)    Hgb urine dipstick LARGE (*)    Bilirubin Urine SMALL (*)    Ketones, ur 15 (*)    Leukocytes, UA SMALL (*)    All other components within normal limits  LIPASE, BLOOD  URINE MICROSCOPIC-ADD ON     Imaging Review No results found.   EKG Interpretation None      MDM   Final diagnoses:  Hematuria  Tachycardia   44 year old female presents with ongoing right flank and right lower quadrant pain for which she has been seen at multiple facilities previously and had a workup for hematuria. She has no evidence of infection currently on her urinary studies and is otherwise well-appearing. She is in mild discomfort. She is tachycardic on initial presentation.  The patient exhibits signs of drug-seeking behavior including demonstrating up to 15 or 20 supposedly kidney stones and a plastic bag. It appears  her recent imaging shows only a single punctate intrarenal stone. Tachycardia may be due in part to mild narcotic withdrawal as patient has been given numerous prescriptions from multiple providers on the narcotic database reviewed. No evidence of acute abdominal pathology suggestive of a need for imaging emergently and patient is ambulatory on discharge.   Leo Grosser, MD 75/30/05 1102  Delora Fuel, MD 04/07/34 6701

## 2014-11-02 NOTE — ED Notes (Signed)
Pt requesting IV fluids and pain medication; IV placed to R hand; fluids hanging. Previous pain medications discontinued

## 2014-11-02 NOTE — ED Notes (Signed)
Updated Dr Laneta Simmers on pts VS - gave permission for dc.

## 2015-02-15 ENCOUNTER — Encounter (HOSPITAL_COMMUNITY): Payer: Self-pay

## 2015-02-15 ENCOUNTER — Inpatient Hospital Stay (HOSPITAL_COMMUNITY)
Admission: AD | Admit: 2015-02-15 | Discharge: 2015-02-15 | Disposition: A | Discharge status: Home / Self Care | Payer: Self-pay | Source: Ambulatory Visit | Attending: Obstetrics & Gynecology | Admitting: Obstetrics & Gynecology

## 2015-02-15 ENCOUNTER — Inpatient Hospital Stay (HOSPITAL_COMMUNITY): Payer: 59

## 2015-02-15 DIAGNOSIS — Z85528 Personal history of other malignant neoplasm of kidney: Secondary | ICD-10-CM | POA: Insufficient documentation

## 2015-02-15 DIAGNOSIS — N898 Other specified noninflammatory disorders of vagina: Secondary | ICD-10-CM

## 2015-02-15 DIAGNOSIS — E282 Polycystic ovarian syndrome: Secondary | ICD-10-CM | POA: Insufficient documentation

## 2015-02-15 DIAGNOSIS — Z85850 Personal history of malignant neoplasm of thyroid: Secondary | ICD-10-CM | POA: Insufficient documentation

## 2015-02-15 DIAGNOSIS — I10 Essential (primary) hypertension: Secondary | ICD-10-CM | POA: Insufficient documentation

## 2015-02-15 DIAGNOSIS — E079 Disorder of thyroid, unspecified: Secondary | ICD-10-CM | POA: Insufficient documentation

## 2015-02-15 DIAGNOSIS — N939 Abnormal uterine and vaginal bleeding, unspecified: Secondary | ICD-10-CM | POA: Insufficient documentation

## 2015-02-15 DIAGNOSIS — K219 Gastro-esophageal reflux disease without esophagitis: Secondary | ICD-10-CM | POA: Insufficient documentation

## 2015-02-15 DIAGNOSIS — Z90710 Acquired absence of both cervix and uterus: Secondary | ICD-10-CM | POA: Insufficient documentation

## 2015-02-15 LAB — URINALYSIS, ROUTINE W REFLEX MICROSCOPIC
Bilirubin Urine: NEGATIVE
Glucose, UA: NEGATIVE mg/dL
KETONES UR: NEGATIVE mg/dL
Nitrite: NEGATIVE
PROTEIN: NEGATIVE mg/dL
Specific Gravity, Urine: >1.03 — ABNORMAL HIGH (ref 1.005–1.030)
UROBILINOGEN UA: 0.2 mg/dL (ref 0.0–1.0)
pH: 6 (ref 5.0–8.0)

## 2015-02-15 LAB — CBC
HCT: 37 % (ref 36.0–46.0)
HEMOGLOBIN: 11.9 g/dL — AB (ref 12.0–15.0)
MCH: 25.8 pg — ABNORMAL LOW (ref 26.0–34.0)
MCHC: 32.2 g/dL (ref 30.0–36.0)
MCV: 80.3 fL (ref 78.0–100.0)
Platelets: 308 10*3/uL (ref 150–400)
RBC: 4.61 MIL/uL (ref 3.87–5.11)
RDW: 14.5 % (ref 11.5–15.5)
WBC: 11 10*3/uL — AB (ref 4.0–10.5)

## 2015-02-15 LAB — HIV ANTIBODY (ROUTINE TESTING W REFLEX): HIV Screen 4th Generation wRfx: NONREACTIVE

## 2015-02-15 LAB — RAPID URINE DRUG SCREEN, HOSP PERFORMED
AMPHETAMINES: NOT DETECTED
Barbiturates: NOT DETECTED
Benzodiazepines: NOT DETECTED
COCAINE: NOT DETECTED
OPIATES: NOT DETECTED
TETRAHYDROCANNABINOL: NOT DETECTED

## 2015-02-15 LAB — GC/CHLAMYDIA PROBE AMP (~~LOC~~) NOT AT ARMC
Chlamydia: NEGATIVE
NEISSERIA GONORRHEA: NEGATIVE

## 2015-02-15 LAB — URINE MICROSCOPIC-ADD ON

## 2015-02-15 LAB — WET PREP, GENITAL
CLUE CELLS WET PREP: NONE SEEN
TRICH WET PREP: NONE SEEN
YEAST WET PREP: NONE SEEN

## 2015-02-15 NOTE — Discharge Instructions (Signed)
Abdominal Pain, Women °Abdominal (stomach, pelvic, or belly) pain can be caused by many things. It is important to tell your doctor: °· The location of the pain. °· Does it come and go or is it present all the time? °· Are there things that start the pain (eating certain foods, exercise)? °· Are there other symptoms associated with the pain (fever, nausea, vomiting, diarrhea)? °All of this is helpful to know when trying to find the cause of the pain. °CAUSES  °· Stomach: virus or bacteria infection, or ulcer. °· Intestine: appendicitis (inflamed appendix), regional ileitis (Crohn's disease), ulcerative colitis (inflamed colon), irritable bowel syndrome, diverticulitis (inflamed diverticulum of the colon), or cancer of the stomach or intestine. °· Gallbladder disease or stones in the gallbladder. °· Kidney disease, kidney stones, or infection. °· Pancreas infection or cancer. °· Fibromyalgia (pain disorder). °· Diseases of the female organs: °¨ Uterus: fibroid (non-cancerous) tumors or infection. °¨ Fallopian tubes: infection or tubal pregnancy. °¨ Ovary: cysts or tumors. °¨ Pelvic adhesions (scar tissue). °¨ Endometriosis (uterus lining tissue growing in the pelvis and on the pelvic organs). °¨ Pelvic congestion syndrome (female organs filling up with blood just before the menstrual period). °¨ Pain with the menstrual period. °¨ Pain with ovulation (producing an egg). °¨ Pain with an IUD (intrauterine device, birth control) in the uterus. °¨ Cancer of the female organs. °· Functional pain (pain not caused by a disease, may improve without treatment). °· Psychological pain. °· Depression. °DIAGNOSIS  °Your doctor will decide the seriousness of your pain by doing an examination. °· Blood tests. °· X-rays. °· Ultrasound. °· CT scan (computed tomography, special type of X-ray). °· MRI (magnetic resonance imaging). °· Cultures, for infection. °· Barium enema (dye inserted in the large intestine, to better view it with  X-rays). °· Colonoscopy (looking in intestine with a lighted tube). °· Laparoscopy (minor surgery, looking in abdomen with a lighted tube). °· Major abdominal exploratory surgery (looking in abdomen with a large incision). °TREATMENT  °The treatment will depend on the cause of the pain.  °· Many cases can be observed and treated at home. °· Over-the-counter medicines recommended by your caregiver. °· Prescription medicine. °· Antibiotics, for infection. °· Birth control pills, for painful periods or for ovulation pain. °· Hormone treatment, for endometriosis. °· Nerve blocking injections. °· Physical therapy. °· Antidepressants. °· Counseling with a psychologist or psychiatrist. °· Minor or major surgery. °HOME CARE INSTRUCTIONS  °· Do not take laxatives, unless directed by your caregiver. °· Take over-the-counter pain medicine only if ordered by your caregiver. Do not take aspirin because it can cause an upset stomach or bleeding. °· Try a clear liquid diet (broth or water) as ordered by your caregiver. Slowly move to a bland diet, as tolerated, if the pain is related to the stomach or intestine. °· Have a thermometer and take your temperature several times a day, and record it. °· Bed rest and sleep, if it helps the pain. °· Avoid sexual intercourse, if it causes pain. °· Avoid stressful situations. °· Keep your follow-up appointments and tests, as your caregiver orders. °· If the pain does not go away with medicine or surgery, you may try: °¨ Acupuncture. °¨ Relaxation exercises (yoga, meditation). °¨ Group therapy. °¨ Counseling. °SEEK MEDICAL CARE IF:  °· You notice certain foods cause stomach pain. °· Your home care treatment is not helping your pain. °· You need stronger pain medicine. °· You want your IUD removed. °· You feel faint or   lightheaded. °· You develop nausea and vomiting. °· You develop a rash. °· You are having side effects or an allergy to your medicine. °SEEK IMMEDIATE MEDICAL CARE IF:  °· Your  pain does not go away or gets worse. °· You have a fever. °· Your pain is felt only in portions of the abdomen. The right side could possibly be appendicitis. The left lower portion of the abdomen could be colitis or diverticulitis. °· You are passing blood in your stools (bright red or black tarry stools, with or without vomiting). °· You have blood in your urine. °· You develop chills, with or without a fever. °· You pass out. °MAKE SURE YOU:  °· Understand these instructions. °· Will watch your condition. °· Will get help right away if you are not doing well or get worse. °Document Released: 03/05/2007 Document Revised: 09/22/2013 Document Reviewed: 03/25/2009 °[employer]® Patient Information ©2015 [employer]. This information is not intended to replace advice given to you by your health care provider. Make sure you discuss any questions you have with your health care provider. ° °

## 2015-02-15 NOTE — MAU Note (Signed)
Pt states that in 2009 she had hysterectomy-still has ovaries. 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 Around 100am-had a gush of blood and some RLQ pain. Is not currently bleeding, still has pain. Intermittent and rates at 5/10. Took 600mg  of ibuprofen at 0130-has slightly helped. Denies urinary s/s.

## 2015-02-15 NOTE — MAU Provider Note (Signed)
Chief Complaint: Abdominal Pain and Vaginal Bleeding   First Provider Initiated Contact with Patient 02/15/15 0353      SUBJECTIVE HPI: Yolanda Matthews is a 44 y.o. [id number] who presents to maternity admissions reporting single gush of bright red bleeding from the vagina at 1:30 am without additional bleeding and onset of right lower quadrant abdominal pain after the bleeding.  She has hx of kidney stones and renal cell carcinoma with partial right nephrectomy.  She reports this pain is different, and not like her kidney pain.  She is s/p hysterectomy in 2009.  She does not have a gyn provider at this time.  She reports she has noticed feeling something in the vagina during intercourse, not painful but the sensation that something is "in the way".  This started 2-3 months ago.  An ED visit in 10/2014 indicates the pt has had multiple narcotic prescriptions from multiple different physicians according to a controlled substance reporting website.  The pt denies narcotic medication usage today.  Pt declined CT at previous encounter related to frequent CT for kidney problems and concern for radiation.  She denies vaginal bleeding, vaginal itching/burning, urinary symptoms, h/a, dizziness, n/v, or fever/chills.     Abdominal Pain This is a recurrent problem. The current episode started today. The onset quality is sudden. The problem occurs intermittently. The problem has been waxing and waning. The pain is located in the RLQ. The pain is severe. The quality of the pain is sharp. The abdominal pain does not radiate. Associated symptoms include nausea. Pertinent negatives include no constipation, diarrhea, dysuria, fever, frequency, headaches or vomiting. The pain is aggravated by certain positions and movement. The pain is relieved by nothing. Treatments tried: nsaids. The treatment provided no relief. Her past medical history is significant for abdominal surgery.  Vaginal Bleeding The patient's primary symptoms  include vaginal bleeding. The patient's pertinent negatives include no pelvic pain or vaginal discharge. This is a new problem. The current episode started today. The problem occurs intermittently. The problem has been resolved. The pain is severe. She is not pregnant. Associated symptoms include abdominal pain and nausea. Pertinent negatives include no chills, constipation, diarrhea, dysuria, fever, flank pain, frequency, headaches or vomiting. The vaginal bleeding is lighter than menses. She has not been passing clots. She has not been passing tissue. Nothing aggravates the symptoms. She has tried nothing for the symptoms. She is sexually active. No, her partner does not have an STD. She uses hysterectomy for contraception. Her past medical history is significant for an abdominal surgery.    Past Medical History  Diagnosis Date  . Renal disorder   . Thyroid disease   . Hypertension   . Polycystic ovarian syndrome   . GERD (gastroesophageal reflux disease)   . Kidney stones   . History of kidney cancer   . Kidney stones   . Cancer     renal  . Thyroid cancer    Past Surgical History  Procedure Laterality Date  . Cesarean section    . Tonsillectomy    . Abdominal hysterectomy    . Partial nephrectomy    . Tubal ligation Right 2010   Social History   Social History  . Marital Status: Unknown    Spouse Name: N/A  . Number of Children: N/A  . Years of Education: N/A   Occupational History  . Not on file.   Social History Main Topics  . Smoking status: Never Smoker   . Smokeless tobacco: Never Used  .  Alcohol Use: No  . Drug Use: No  . Sexual Activity: Not on file   Other Topics Concern  . Not on file   Social History Narrative   No current facility-administered medications on file prior to encounter.   Current Outpatient Prescriptions on File Prior to Encounter  Medication Sig Dispense Refill  . amLODipine (NORVASC) 5 MG tablet Take 5 mg by mouth daily.    .  cefUROXime (CEFTIN) 500 MG tablet Take 1 tablet (500 mg total) by mouth 2 (two) times daily with a meal. (Patient not taking: Reported on 09/29/2014) 8 tablet 0  . furosemide (LASIX) 40 MG tablet Take 40 mg by mouth daily.    Marland Kitchen HYDROcodone-acetaminophen (NORCO) 5-325 MG per tablet Take 2 tablets by mouth every 4 (four) hours as needed. 10 tablet 0  . ibuprofen (ADVIL,MOTRIN) 200 MG tablet Take 3 tablets (600 mg total) by mouth every 6 (six) hours as needed for fever or mild pain.    Marland Kitchen levothyroxine (SYNTHROID, LEVOTHROID) 200 MCG tablet Take 200 mcg by mouth daily before breakfast.    . metoCLOPramide (REGLAN) 10 MG tablet Take 1 tablet (10 mg total) by mouth every 6 (six) hours as needed for nausea (nausea/headache). 6 tablet 0  . metoprolol tartrate (LOPRESSOR) 25 MG tablet Take 25 mg by mouth 2 (two) times daily.    . Multiple Vitamin (MULTIVITAMIN WITH MINERALS) TABS tablet Take 1 tablet by mouth daily.    . Omega-3 Fatty Acids (FISH OIL PO) Take 1 tablet by mouth daily.    Marland Kitchen omeprazole (PRILOSEC) 20 MG capsule Take 20 mg by mouth daily.    . ondansetron (ZOFRAN ODT) 8 MG disintegrating tablet Take 1 tablet (8 mg total) by mouth every 8 (eight) hours as needed for nausea or vomiting. 20 tablet 0  . oxyCODONE-acetaminophen (PERCOCET) 5-325 MG per tablet Take 1-2 tablets by mouth every 6 (six) hours as needed for moderate pain or severe pain. 10 tablet 0  . polyethylene glycol (MIRALAX) packet Take 17 g by mouth 2 (two) times daily. (Patient not taking: Reported on 09/29/2014) 14 each 0  . senna (SENOKOT) 8.6 MG TABS tablet Take 2 tablets (17.2 mg total) by mouth daily. Stop if having diarrhea. (Patient not taking: Reported on 09/29/2014) 60 each 0  . sertraline (ZOLOFT) 50 MG tablet Take 50 mg by mouth daily.    Marland Kitchen VITAMIN E PO Take 1 tablet by mouth daily.     Allergies  Allergen Reactions  . Aminocaproic Acid Nausea And Vomiting  . Demerol [Meperidine Hcl] Hives, Itching and Palpitations     ROS:  Review of Systems  Constitutional: Negative for fever, chills and fatigue.  HENT: Negative for sinus pressure.   Eyes: Negative for photophobia.  Respiratory: Negative for shortness of breath.   Cardiovascular: Negative for chest pain.  Gastrointestinal: Positive for nausea and abdominal pain. Negative for vomiting, diarrhea and constipation.  Genitourinary: Positive for vaginal bleeding. Negative for dysuria, frequency, flank pain, vaginal discharge, difficulty urinating, vaginal pain and pelvic pain.  Musculoskeletal: Negative for neck pain.  Neurological: Negative for dizziness, weakness and headaches.  Psychiatric/Behavioral: Negative.      I have reviewed patient's Past Medical Hx, Surgical Hx, Family Hx, Social Hx, medications and allergies.   Physical Exam   Patient Vitals for the past 24 hrs:  BP Temp Temp src Pulse Resp SpO2 Height Weight  02/15/15 0513 136/86 mmHg 98.7 F (37.1 C) Oral 78 20 - - -  02/15/15 0331 128/69  mmHg 98.8 F (37.1 C) Oral 84 18 100 % - -  02/15/15 0315 - - - - - - 5' 7.5" (1.715 m) 126.463 kg (278 lb 12.8 oz)   Constitutional: Well-developed, well-nourished female in no acute distress.  Cardiovascular: normal rate Respiratory: normal effort GI: Abd soft, mild tenderness in RLQ, no rebound tenderness or guarding. Pos BS x 4 MS: Extremities nontender, no edema, normal ROM Neurologic: Alert and oriented x 4.  GU: mild right CVAT.  PELVIC EXAM: Surgically absent cervix, area of flesh colored, smooth soft tissue protruding from upper posterior vaginal wall, scant white creamy discharge, no bleeding noted Bimanual exam: No mass palpable in vagina, uterus nontender, nonenlarged, adnexa without tenderness, enlargement, or mass   LAB RESULTS Results for orders placed or performed during the hospital encounter of 02/15/15 (from the past 24 hour(s))  Urinalysis, Routine w reflex microscopic (not at [facility])     Status: Abnormal   Collection  Time: 02/15/15  3:11 AM  Result Value Ref Range   Color, Urine YELLOW YELLOW   APPearance CLEAR CLEAR   Specific Gravity, Urine >1.030 (H) 1.005 - 1.030   pH 6.0 5.0 - 8.0   Glucose, UA NEGATIVE NEGATIVE mg/dL   Hgb urine dipstick SMALL (A) NEGATIVE   Bilirubin Urine NEGATIVE NEGATIVE   Ketones, ur NEGATIVE NEGATIVE mg/dL   Protein, ur NEGATIVE NEGATIVE mg/dL   Urobilinogen, UA 0.2 0.0 - 1.0 mg/dL   Nitrite NEGATIVE NEGATIVE   Leukocytes, UA TRACE (A) NEGATIVE  Urine microscopic-add on     Status: None   Collection Time: 02/15/15  3:11 AM  Result Value Ref Range   Squamous Epithelial / LPF RARE RARE   WBC, UA 3-6 <3 WBC/hpf   RBC / HPF 7-10 <3 RBC/hpf   Bacteria, UA RARE RARE  Urine rapid drug screen (hosp performed)     Status: None   Collection Time: 02/15/15  3:11 AM  Result Value Ref Range   Opiates NONE DETECTED NONE DETECTED   Cocaine NONE DETECTED NONE DETECTED   Benzodiazepines NONE DETECTED NONE DETECTED   Amphetamines NONE DETECTED NONE DETECTED   Tetrahydrocannabinol NONE DETECTED NONE DETECTED   Barbiturates NONE DETECTED NONE DETECTED  CBC     Status: Abnormal   Collection Time: 02/15/15  3:45 AM  Result Value Ref Range   WBC 11.0 (H) 4.0 - 10.5 K/uL   RBC 4.61 3.87 - 5.11 MIL/uL   Hemoglobin 11.9 (L) 12.0 - 15.0 g/dL   HCT 37.0 36.0 - 46.0 %   MCV 80.3 78.0 - 100.0 fL   MCH 25.8 (L) 26.0 - 34.0 pg   MCHC 32.2 30.0 - 36.0 g/dL   RDW 14.5 11.5 - 15.5 %   Platelets 308 150 - 400 K/uL  Wet prep, genital     Status: Abnormal   Collection Time: 02/15/15  4:00 AM  Result Value Ref Range   Yeast Wet Prep HPF POC NONE SEEN NONE SEEN   Trich, Wet Prep NONE SEEN NONE SEEN   Clue Cells Wet Prep HPF POC NONE SEEN NONE SEEN   WBC, Wet Prep HPF POC FEW (A) NONE SEEN       IMAGING US Transvaginal Non-ob  02/15/2015   CLINICAL DATA:  Abnormal vaginal bleeding. Vaginal mass on speculum exam. History of hysterectomy.  EXAM: TRANSABDOMINAL AND TRANSVAGINAL ULTRASOUND  OF PELVIS  TECHNIQUE: Both transabdominal and transvaginal ultrasound examinations of the pelvis were performed. Transabdominal technique was performed for global imaging of the pelvis  including uterus, ovaries, adnexal regions, and pelvic cul-de-sac. It was necessary to proceed with endovaginal exam following the transabdominal exam to visualize the ovaries.  COMPARISON:  None  FINDINGS: Uterus  Surgically absent.  No visible cervix or vaginal mass.  Right ovary  Not visualized  Left ovary  Not visualized  Other findings  No free pelvic fluid or visible mass.  IMPRESSION: 1. Reported vaginal mass was not visualized sonographically. 2. Ovaries could not be visualized. No adnexal mass or pelvic fluid. 3. Hysterectomy.   Electronically Signed   By: Monte Fantasia M.D.   On: 02/15/2015 04:34   US Pelvis Complete  02/15/2015   CLINICAL DATA:  Abnormal vaginal bleeding. Vaginal mass on speculum exam. History of hysterectomy.  EXAM: TRANSABDOMINAL AND TRANSVAGINAL ULTRASOUND OF PELVIS  TECHNIQUE: Both transabdominal and transvaginal ultrasound examinations of the pelvis were performed. Transabdominal technique was performed for global imaging of the pelvis including uterus, ovaries, adnexal regions, and pelvic cul-de-sac. It was necessary to proceed with endovaginal exam following the transabdominal exam to visualize the ovaries.  COMPARISON:  None  FINDINGS: Uterus  Surgically absent.  No visible cervix or vaginal mass.  Right ovary  Not visualized  Left ovary  Not visualized  Other findings  No free pelvic fluid or visible mass.  IMPRESSION: 1. Reported vaginal mass was not visualized sonographically. 2. Ovaries could not be visualized. No adnexal mass or pelvic fluid. 3. Hysterectomy.   Electronically Signed   By: Monte Fantasia M.D.   On: 02/15/2015 04:34    MAU Management/MDM: Ordered labs and [place] and reviewed results. Vaginal mass visualized is likely vaginal wall prolapse as it is not visualized on [place] or  palpable on digital exam. Pt stable at time of discharge.  ASSESSMENT 1. Abnormal vaginal bleeding   2. Vaginal mass   3. History of hysterectomy for indication other than malignancy   4. Abnormal vaginal bleeding   5. Vaginal mass   6. History of hysterectomy for indication other than malignancy     PLAN Discharge home Urine sent for culture F/U appointment Wednesday @ 2pm in [facility]  F/U with primary care as needed for persistent abdominal pain Return to MAU as needed for emergencies    Medication List    TAKE these medications        amLODipine 5 MG tablet  Commonly known as:  NORVASC  Take 5 mg by mouth daily.     cefUROXime 500 MG tablet  Commonly known as:  CEFTIN  Take 1 tablet (500 mg total) by mouth 2 (two) times daily with a meal.     FISH OIL PO  Take 1 tablet by mouth daily.     furosemide 40 MG tablet  Commonly known as:  LASIX  Take 40 mg by mouth daily.     HYDROcodone-acetaminophen 5-325 MG per tablet  Commonly known as:  NORCO  Take 2 tablets by mouth every 4 (four) hours as needed.     ibuprofen 200 MG tablet  Commonly known as:  ADVIL,MOTRIN  Take 3 tablets (600 mg total) by mouth every 6 (six) hours as needed for fever or mild pain.     levothyroxine 200 MCG tablet  Commonly known as:  SYNTHROID, LEVOTHROID  Take 200 mcg by mouth daily before breakfast.     metoCLOPramide 10 MG tablet  Commonly known as:  REGLAN  Take 1 tablet (10 mg total) by mouth every 6 (six) hours as needed for nausea (nausea/headache).  metoprolol tartrate 25 MG tablet  Commonly known as:  LOPRESSOR  Take 25 mg by mouth 2 (two) times daily.     multivitamin with minerals Tabs tablet  Take 1 tablet by mouth daily.     omeprazole 20 MG capsule  Commonly known as:  PRILOSEC  Take 20 mg by mouth daily.     ondansetron 8 MG disintegrating tablet  Commonly known as:  ZOFRAN ODT  Take 1 tablet (8 mg total) by mouth every 8 (eight) hours as needed for nausea  or vomiting.     oxyCODONE-acetaminophen 5-325 MG per tablet  Commonly known as:  PERCOCET  Take 1-2 tablets by mouth every 6 (six) hours as needed for moderate pain or severe pain.     polyethylene glycol packet  Commonly known as:  MIRALAX  Take 17 g by mouth 2 (two) times daily.     senna 8.6 MG Tabs tablet  Commonly known as:  SENOKOT  Take 2 tablets (17.2 mg total) by mouth daily. Stop if having diarrhea.     sertraline 50 MG tablet  Commonly known as:  ZOLOFT  Take 50 mg by mouth daily.     VITAMIN E PO  Take 1 tablet by mouth daily.       Follow-up Information    Follow up with [facility].   Specialty:  Obstetrics and Gynecology   Why:  On Wednesday, September 28, at 2pm for scheduled appointment.   Contact information:   [place] [phone]      Follow up with [facility].   Why:  As needed for emergencies   Contact information:   [address] [id number] [place] Browns Certified Nurse-Midwife 02/15/2015  5:31 AM

## 2015-02-16 LAB — URINE CULTURE: CULTURE: NO GROWTH

## 2015-02-17 ENCOUNTER — Ambulatory Visit: Payer: 59 | Admitting: Obstetrics & Gynecology

## 2015-06-03 ENCOUNTER — Encounter (HOSPITAL_COMMUNITY): Payer: Self-pay | Admitting: Emergency Medicine

## 2015-06-03 ENCOUNTER — Emergency Department (HOSPITAL_COMMUNITY)
Admission: EM | Admit: 2015-06-03 | Discharge: 2015-06-04 | Discharge status: Against Medical Advice | Payer: 59 | Attending: Emergency Medicine | Admitting: Emergency Medicine

## 2015-06-03 DIAGNOSIS — Z90710 Acquired absence of both cervix and uterus: Secondary | ICD-10-CM | POA: Insufficient documentation

## 2015-06-03 DIAGNOSIS — I10 Essential (primary) hypertension: Secondary | ICD-10-CM | POA: Insufficient documentation

## 2015-06-03 DIAGNOSIS — Z87442 Personal history of urinary calculi: Secondary | ICD-10-CM | POA: Insufficient documentation

## 2015-06-03 DIAGNOSIS — R1032 Left lower quadrant pain: Secondary | ICD-10-CM | POA: Insufficient documentation

## 2015-06-03 DIAGNOSIS — R109 Unspecified abdominal pain: Secondary | ICD-10-CM

## 2015-06-03 DIAGNOSIS — Z85850 Personal history of malignant neoplasm of thyroid: Secondary | ICD-10-CM | POA: Insufficient documentation

## 2015-06-03 DIAGNOSIS — R319 Hematuria, unspecified: Secondary | ICD-10-CM | POA: Insufficient documentation

## 2015-06-03 DIAGNOSIS — Z9889 Other specified postprocedural states: Secondary | ICD-10-CM | POA: Insufficient documentation

## 2015-06-03 DIAGNOSIS — Z79899 Other long term (current) drug therapy: Secondary | ICD-10-CM | POA: Insufficient documentation

## 2015-06-03 DIAGNOSIS — K219 Gastro-esophageal reflux disease without esophagitis: Secondary | ICD-10-CM | POA: Insufficient documentation

## 2015-06-03 DIAGNOSIS — R350 Frequency of micturition: Secondary | ICD-10-CM | POA: Insufficient documentation

## 2015-06-03 DIAGNOSIS — Z87448 Personal history of other diseases of urinary system: Secondary | ICD-10-CM | POA: Insufficient documentation

## 2015-06-03 DIAGNOSIS — Z85528 Personal history of other malignant neoplasm of kidney: Secondary | ICD-10-CM | POA: Insufficient documentation

## 2015-06-03 DIAGNOSIS — R61 Generalized hyperhidrosis: Secondary | ICD-10-CM | POA: Insufficient documentation

## 2015-06-03 DIAGNOSIS — E079 Disorder of thyroid, unspecified: Secondary | ICD-10-CM | POA: Insufficient documentation

## 2015-06-03 DIAGNOSIS — Z9851 Tubal ligation status: Secondary | ICD-10-CM | POA: Insufficient documentation

## 2015-06-03 DIAGNOSIS — R112 Nausea with vomiting, unspecified: Secondary | ICD-10-CM | POA: Insufficient documentation

## 2015-06-03 LAB — COMPREHENSIVE METABOLIC PANEL
ALBUMIN: 4 g/dL (ref 3.5–5.0)
ALK PHOS: 95 U/L (ref 38–126)
ALT: 27 U/L (ref 14–54)
AST: 26 U/L (ref 15–41)
Anion gap: 10 (ref 5–15)
BILIRUBIN TOTAL: 0.3 mg/dL (ref 0.3–1.2)
BUN: 14 mg/dL (ref 6–20)
CO2: 31 mmol/L (ref 22–32)
Calcium: 9.2 mg/dL (ref 8.9–10.3)
Chloride: 100 mmol/L — ABNORMAL LOW (ref 101–111)
Creatinine, Ser: 0.7 mg/dL (ref 0.44–1.00)
GFR calc Af Amer: >60 mL/min (ref >60)
GFR calc non Af Amer: >60 mL/min (ref >60)
GLUCOSE: 115 mg/dL — AB (ref 65–99)
POTASSIUM: 3.4 mmol/L — AB (ref 3.5–5.1)
SODIUM: 141 mmol/L (ref 135–145)
TOTAL PROTEIN: 7.5 g/dL (ref 6.5–8.1)

## 2015-06-03 LAB — URINE MICROSCOPIC-ADD ON: Squamous Epithelial / LPF: NONE SEEN

## 2015-06-03 LAB — CBC
HEMATOCRIT: 37.7 % (ref 36.0–46.0)
HEMOGLOBIN: 11.8 g/dL — AB (ref 12.0–15.0)
MCH: 25.7 pg — AB (ref 26.0–34.0)
MCHC: 31.3 g/dL (ref 30.0–36.0)
MCV: 82.1 fL (ref 78.0–100.0)
Platelets: 336 10*3/uL (ref 150–400)
RBC: 4.59 MIL/uL (ref 3.87–5.11)
RDW: 13.7 % (ref 11.5–15.5)
WBC: 8.8 10*3/uL (ref 4.0–10.5)

## 2015-06-03 LAB — URINALYSIS, ROUTINE W REFLEX MICROSCOPIC
Bilirubin Urine: NEGATIVE
GLUCOSE, UA: NEGATIVE mg/dL
Ketones, ur: NEGATIVE mg/dL
NITRITE: NEGATIVE
PH: 6 (ref 5.0–8.0)
PROTEIN: NEGATIVE mg/dL
Specific Gravity, Urine: 1.02 (ref 1.005–1.030)

## 2015-06-03 LAB — LIPASE, BLOOD: Lipase: 25 U/L (ref 11–51)

## 2015-06-03 NOTE — ED Notes (Addendum)
Pt reports hematuria for 24 hours with pain, pressure and increase frequency. Also has lower back pain and reports n/v with 1 episode of vomit. History of kidney stones and kidney cancer.

## 2015-06-03 NOTE — ED Notes (Signed)
3 episodes of vomiting while in waiting room, increased frequency and dysuria.  Pain constant in bladder and intermittent in back.

## 2015-06-03 NOTE — ED Provider Notes (Signed)
CSN: [id number]     Arrival date & time 06/03/15  1736 History  By signing my name below, I, Yolanda Matthews, attest that this documentation has been prepared under the direction and in the presence of Delora Fuel, MD. Electronically Signed: Altamease Matthews, ED Scribe. 06/04/2015. 12:09 AM   Chief Complaint  Patient presents with  . Flank Pain  . Hematuria   The history is provided by the patient. No language interpreter was used.   Yolanda Matthews is a 45 y.o. female with history of renal cancer s/p partial right nephrectomy and kidney stones who presents to the Emergency Department complaining of intermittent, 6/10 in severity, left flank pain with onset today. This pain is similar in quality to pain that she has had in the past with kidney stones.  Associated symptoms include hematuria with onset last night, increased urinary frequency, lower abdominal pain, nausea, vomiting, and sweating. She has been using hydrocodone, ibuprofen, and pyridium at home with some relief in symptoms. Pt denies fever and chills. Her urology f/u is at [facility].   Past Medical History  Diagnosis Date  . Renal disorder   . Thyroid disease   . Hypertension   . Polycystic ovarian syndrome   . GERD (gastroesophageal reflux disease)   . Kidney stones   . History of kidney cancer   . Kidney stones   . Cancer [facility])     renal  . Thyroid cancer [facility])    Past Surgical History  Procedure Laterality Date  . Cesarean section    . Tonsillectomy    . Abdominal hysterectomy    . Partial nephrectomy    . Tubal ligation Right 2010   Family History  Problem Relation Age of Onset  . Hypertension Mother   . Diabetes Mother   . Hypertension Father   . Cancer Father    Social History  Substance Use Topics  . Smoking status: Never Smoker   . Smokeless tobacco: Never Used  . Alcohol Use: No   OB History    Gravida Para Term Preterm AB TAB SAB Ectopic Multiple Living   4 3 2 1 1  1   3      Review of Systems   Constitutional: Positive for diaphoresis. Negative for fever and chills.  Gastrointestinal: Positive for nausea, vomiting and abdominal pain.  Genitourinary: Positive for frequency, hematuria and flank pain.  All other systems reviewed and are negative.  Allergies  Aminocaproic acid and Demerol  Home Medications   Prior to Admission medications   Medication Sig Start Date End Date Taking? Authorizing Provider  amLODipine (NORVASC) 5 MG tablet Take 5 mg by mouth daily.    Historical Provider, MD  cefUROXime (CEFTIN) 500 MG tablet Take 1 tablet (500 mg total) by mouth 2 (two) times daily with a meal. Patient not taking: Reported on 09/29/2014 08/26/14   Modena Jansky, MD  furosemide (LASIX) 40 MG tablet Take 40 mg by mouth daily.    Historical Provider, MD  HYDROcodone-acetaminophen (NORCO) 5-325 MG per tablet Take 2 tablets by mouth every 4 (four) hours as needed. 09/29/14   Elnora Morrison, MD  ibuprofen (ADVIL,MOTRIN) 200 MG tablet Take 3 tablets (600 mg total) by mouth every 6 (six) hours as needed for fever or mild pain. 08/26/14   Modena Jansky, MD  levothyroxine (SYNTHROID, LEVOTHROID) 200 MCG tablet Take 200 mcg by mouth daily before breakfast.    Historical Provider, MD  metoCLOPramide (REGLAN) 10 MG tablet Take 1 tablet (10  mg total) by mouth every 6 (six) hours as needed for nausea (nausea/headache). 09/29/14   Elnora Morrison, MD  metoprolol tartrate (LOPRESSOR) 25 MG tablet Take 25 mg by mouth 2 (two) times daily.    Historical Provider, MD  Multiple Vitamin (MULTIVITAMIN WITH MINERALS) TABS tablet Take 1 tablet by mouth daily.    Historical Provider, MD  Omega-3 Fatty Acids (FISH OIL PO) Take 1 tablet by mouth daily.    Historical Provider, MD  omeprazole (PRILOSEC) 20 MG capsule Take 20 mg by mouth daily.    Historical Provider, MD  ondansetron (ZOFRAN ODT) 8 MG disintegrating tablet Take 1 tablet (8 mg total) by mouth every 8 (eight) hours as needed for nausea or vomiting. 06/26/14    Comer Locket, PA-C  oxyCODONE-acetaminophen (PERCOCET) 5-325 MG per tablet Take 1-2 tablets by mouth every 6 (six) hours as needed for moderate pain or severe pain. 08/26/14   Modena Jansky, MD  polyethylene glycol [facility]) packet Take 17 g by mouth 2 (two) times daily. Patient not taking: Reported on 09/29/2014 08/26/14   Modena Jansky, MD  senna (SENOKOT) 8.6 MG TABS tablet Take 2 tablets (17.2 mg total) by mouth daily. Stop if having diarrhea. Patient not taking: Reported on 09/29/2014 08/26/14   Modena Jansky, MD  sertraline (ZOLOFT) 50 MG tablet Take 50 mg by mouth daily.    Historical Provider, MD  VITAMIN E PO Take 1 tablet by mouth daily.    Historical Provider, MD   BP 163/96 mmHg  Pulse 87  Temp(Src) 98.4 F (36.9 C) (Oral)  Resp 18  SpO2 100% Physical Exam  Constitutional: She is oriented to person, place, and time. She appears well-developed and well-nourished. No distress.  HENT:  Head: Normocephalic and atraumatic.  Eyes: EOM are normal. Pupils are equal, round, and reactive to light.  Neck: Normal range of motion. Neck supple. No JVD present.  Cardiovascular: Normal rate, regular rhythm and normal heart sounds.   No murmur heard. Pulmonary/Chest: Effort normal and breath sounds normal. She has no wheezes. She has no rales. She exhibits no tenderness.  Abdominal: Soft. She exhibits no distension and no mass. There is tenderness.  Mild suprapubic tenderness Bowel sounds decreased Mild right CVA tenderness  Musculoskeletal: Normal range of motion. She exhibits no edema.  Lymphadenopathy:    She has no cervical adenopathy.  Neurological: She is alert and oriented to person, place, and time. No cranial nerve deficit. She exhibits normal muscle tone. Coordination normal.  Skin: Skin is warm and dry. No rash noted.  Psychiatric: She has a normal mood and affect. Her behavior is normal. Judgment and thought content normal.  Nursing note and vitals reviewed.   ED  Course  Procedures (including critical care time) DIAGNOSTIC STUDIES: Oxygen Saturation is 100% on RA,  normal by my interpretation.    COORDINATION OF CARE: 12:04 AM Discussed treatment plan which includes lab work, renal US, and Norco/Vicodin with pt at bedside and pt agreed to plan.  Labs Review Results for orders placed or performed during the hospital encounter of 06/03/15  Urinalysis, Routine w reflex microscopic-may I&O cath if menses (not at [facility])  Result Value Ref Range   Color, Urine YELLOW YELLOW   APPearance CLOUDY (A) CLEAR   Specific Gravity, Urine 1.020 1.005 - 1.030   pH 6.0 5.0 - 8.0   Glucose, UA NEGATIVE NEGATIVE mg/dL   Hgb urine dipstick LARGE (A) NEGATIVE   Bilirubin Urine NEGATIVE NEGATIVE   Ketones, ur  NEGATIVE NEGATIVE mg/dL   Protein, ur NEGATIVE NEGATIVE mg/dL   Nitrite NEGATIVE NEGATIVE   Leukocytes, UA SMALL (A) NEGATIVE  Lipase, blood  Result Value Ref Range   Lipase 25 11 - 51 U/L  Comprehensive metabolic panel  Result Value Ref Range   Sodium 141 135 - 145 mmol/L   Potassium 3.4 (L) 3.5 - 5.1 mmol/L   Chloride 100 (L) 101 - 111 mmol/L   CO2 31 22 - 32 mmol/L   Glucose, Bld 115 (H) 65 - 99 mg/dL   BUN 14 6 - 20 mg/dL   Creatinine, Ser 0.70 0.44 - 1.00 mg/dL   Calcium 9.2 8.9 - 10.3 mg/dL   Total Protein 7.5 6.5 - 8.1 g/dL   Albumin 4.0 3.5 - 5.0 g/dL   AST 26 15 - 41 U/L   ALT 27 14 - 54 U/L   Alkaline Phosphatase 95 38 - 126 U/L   Total Bilirubin 0.3 0.3 - 1.2 mg/dL   GFR calc non Af Amer >60 >60 mL/min   GFR calc Af Amer >60 >60 mL/min   Anion gap 10 5 - 15  CBC  Result Value Ref Range   WBC 8.8 4.0 - 10.5 K/uL   RBC 4.59 3.87 - 5.11 MIL/uL   Hemoglobin 11.8 (L) 12.0 - 15.0 g/dL   HCT 37.7 36.0 - 46.0 %   MCV 82.1 78.0 - 100.0 fL   MCH 25.7 (L) 26.0 - 34.0 pg   MCHC 31.3 30.0 - 36.0 g/dL   RDW 13.7 11.5 - 15.5 %   Platelets 336 150 - 400 K/uL  Urine microscopic-add on  Result Value Ref Range   Squamous Epithelial / LPF NONE  SEEN NONE SEEN   WBC, UA 0-5 0 - 5 WBC/hpf   RBC / HPF TOO NUMEROUS TO COUNT 0 - 5 RBC/hpf   Bacteria, UA RARE (A) NONE SEEN   I have personally reviewed and evaluated these images and lab results as part of my medical decision-making.   MDM   Final diagnoses:  Left flank pain  Hematuria    Left flank pain and had hematuria. Patient expresses a long history of kidney stones as well as partial right nephrectomy for cancer. Old records are reviewed and she does have multiple visits for flank pain but almost all imaging has showed no calculi and no hydronephrosis. She has had visits where there were concerns for drug-seeking behavior. Today, patient is honest about prescriptions that she has and is actually refusing narcotics in the ED. I have found 46 prior abdominal CT scans done to the patient so CT is felt best to be avoided. Renal ultrasound was ordered. Patient left before the ultrasound could be done. She told a nurse that she wanted to go out to smoke cigarettes and never returned. I never had a chance to talk with her prior to her leaving.  I personally performed the services described in this documentation, which was scribed in my presence. The recorded information has been reviewed and is accurate.      Delora Fuel, MD [phone]

## 2015-06-04 ENCOUNTER — Emergency Department (HOSPITAL_COMMUNITY): Payer: Self-pay

## 2015-06-04 MED ORDER — HYDROCODONE-ACETAMINOPHEN 5-325 MG PO TABS: 1.0000 | ORAL_TABLET | Freq: Once | ORAL | Status: DC

## 2015-06-04 MED ORDER — ACETAMINOPHEN 325 MG PO TABS: 650.0000 mg | ORAL_TABLET | Freq: Once | ORAL | Status: DC

## 2015-06-04 NOTE — ED Notes (Signed)
Pt still has not returned to the room after more than half an hour.  Discharge/AMA per Agricultural consultant.

## 2015-06-04 NOTE — ED Notes (Signed)
Pt ambulated to restroom without difficulty.  She then came out of the room stating that she was going to take her cell phone to her husband in the waiting room since his battery died.

## 2015-06-05 LAB — URINE CULTURE

## 2017-12-25 ENCOUNTER — Other Ambulatory Visit: Payer: Self-pay

## 2017-12-25 ENCOUNTER — Emergency Department (HOSPITAL_COMMUNITY)
Admission: EM | Admit: 2017-12-25 | Discharge: 2017-12-25 | Disposition: A | Discharge status: Home / Self Care | Payer: Self-pay | Attending: Emergency Medicine | Admitting: Emergency Medicine

## 2017-12-25 ENCOUNTER — Emergency Department (HOSPITAL_COMMUNITY): Payer: Self-pay

## 2017-12-25 ENCOUNTER — Encounter (HOSPITAL_COMMUNITY): Payer: Self-pay

## 2017-12-25 DIAGNOSIS — Y999 Unspecified external cause status: Secondary | ICD-10-CM | POA: Insufficient documentation

## 2017-12-25 DIAGNOSIS — Y9389 Activity, other specified: Secondary | ICD-10-CM | POA: Insufficient documentation

## 2017-12-25 DIAGNOSIS — S29011A Strain of muscle and tendon of front wall of thorax, initial encounter: Secondary | ICD-10-CM | POA: Insufficient documentation

## 2017-12-25 DIAGNOSIS — E039 Hypothyroidism, unspecified: Secondary | ICD-10-CM | POA: Insufficient documentation

## 2017-12-25 DIAGNOSIS — I10 Essential (primary) hypertension: Secondary | ICD-10-CM | POA: Insufficient documentation

## 2017-12-25 DIAGNOSIS — X500XXA Overexertion from strenuous movement or load, initial encounter: Secondary | ICD-10-CM | POA: Insufficient documentation

## 2017-12-25 DIAGNOSIS — Y929 Unspecified place or not applicable: Secondary | ICD-10-CM | POA: Insufficient documentation

## 2017-12-25 DIAGNOSIS — Z79899 Other long term (current) drug therapy: Secondary | ICD-10-CM | POA: Insufficient documentation

## 2017-12-25 MED ORDER — OXYCODONE-ACETAMINOPHEN 5-325 MG PO TABS
1.0000 | ORAL_TABLET | Freq: Once | ORAL | Status: AC
  Administered 2017-12-25: 1 via ORAL
  Filled 2017-12-25: qty 1

## 2017-12-25 MED ORDER — METHOCARBAMOL 500 MG PO TABS: 500.0000 mg | ORAL_TABLET | Freq: Two times a day (BID) | ORAL | 0 refills | Status: DC

## 2017-12-25 NOTE — ED Provider Notes (Signed)
[facility] DEPT Provider Note   CSN: [id number] Arrival date & time: 12/25/17  1647     History   Chief Complaint Chief Complaint  Patient presents with  . rib cage pain    HPI Yolanda Matthews is a 47 y.o. female.  HPI  Yolanda Matthews is a 47 year old female with a history of renal cell carcinoma (s/p partial nephrectomy right), thyroid cancer (s/p thyroidectomy), recurrent kidney stones, hypertension, hyperlipidemia who presents to the emergency department for evaluation of left lower rib pain.  Patient states that 4 days ago she was refilling a Windex bottle and jumped backwards to keep the bottle from landing on her toes.  States that she felt a pull in her left lower rib muscles.  Ever since that time she has had aching pain under her left breast with coughing, sneezing, deep breathing or any movement.  She denies pain outside of movement.  The pain does not radiate and she has no associated shortness of breath, nausea, lightheadedness or diaphoresis.  Denies fever, chills, cough, wheezing, abdominal pain.  She denies falling to the ground.  She denies history of PE/DVT, recent surgery or immobilization, exogenous estrogen, unilateral leg swelling or calf tenderness, active cancer or hemoptysis.  She does not smoke.  Her father had a heart attack at age 14.  She denies history of exertional chest pain.   Past Medical History:  Diagnosis Date  . Cancer ([place])    renal  . GERD (gastroesophageal reflux disease)   . History of kidney cancer   . Hypertension   . Kidney stones   . Kidney stones   . Polycystic ovarian syndrome   . Renal disorder   . Thyroid cancer ([place])   . Thyroid disease     Patient Active Problem List   Diagnosis Date Noted  . Hematuria   . Kidney stones   . Acute right flank pain 08/24/2014  . Hypothyroidism 08/24/2014  . Recurrent nephrolithiasis 08/24/2014  . Essential hypertension 08/24/2014  . Polycystic ovarian syndrome  08/24/2014  . Renal cell cancer ([place]) 08/24/2014  . Thyroid cancer ([place]) 08/24/2014  . Urinary tract infection 08/24/2014  . Urinary tract infectious disease   . Renal cell carcinoma ([place])   . Other specified hypothyroidism     Past Surgical History:  Procedure Laterality Date  . ABDOMINAL HYSTERECTOMY    . CESAREAN SECTION    . PARTIAL NEPHRECTOMY    . TONSILLECTOMY    . TUBAL LIGATION Right 2010     OB History    Gravida  4   Para  3   Term  2   Preterm  1   AB  1   Living  3     SAB  1   TAB      Ectopic      Multiple      Live Births  3            Home Medications    Prior to Admission medications   Medication Sig Start Date End Date Taking? Authorizing Provider  amLODipine (NORVASC) 5 MG tablet Take 5 mg by mouth daily.    [provider]  cefUROXime (CEFTIN) 500 MG tablet Take 1 tablet (500 mg total) by mouth 2 (two) times daily with a meal. Patient not taking: Reported on 09/29/2014 08/26/14   Modena Jansky, MD  furosemide (LASIX) 40 MG tablet Take 40 mg by mouth daily.    [provider]  HYDROcodone-acetaminophen (  NORCO) 5-325 MG per tablet Take 2 tablets by mouth every 4 (four) hours as needed. 09/29/14   Elnora Morrison, MD  ibuprofen (ADVIL,MOTRIN) 200 MG tablet Take 3 tablets (600 mg total) by mouth every 6 (six) hours as needed for fever or mild pain. 08/26/14   Hongalgi, Lenis Dickinson, MD  levothyroxine (SYNTHROID, LEVOTHROID) 200 MCG tablet Take 200 mcg by mouth daily before breakfast.    [provider]  metoCLOPramide (REGLAN) 10 MG tablet Take 1 tablet (10 mg total) by mouth every 6 (six) hours as needed for nausea (nausea/headache). 09/29/14   Elnora Morrison, MD  metoprolol tartrate (LOPRESSOR) 25 MG tablet Take 25 mg by mouth 2 (two) times daily.    [provider]  Multiple Vitamin (MULTIVITAMIN WITH MINERALS) TABS tablet Take 1 tablet by mouth daily.    [provider]  Omega-3 Fatty Acids (FISH  OIL PO) Take 1 tablet by mouth daily.    [provider]  omeprazole (PRILOSEC) 20 MG capsule Take 20 mg by mouth daily.    [provider]  ondansetron (ZOFRAN ODT) 8 MG disintegrating tablet Take 1 tablet (8 mg total) by mouth every 8 (eight) hours as needed for nausea or vomiting. 06/26/14   Comer Locket, PA-C  oxyCODONE-acetaminophen (PERCOCET) 5-325 MG per tablet Take 1-2 tablets by mouth every 6 (six) hours as needed for moderate pain or severe pain. 08/26/14   Hongalgi, Lenis Dickinson, MD  polyethylene glycol [facility]) packet Take 17 g by mouth 2 (two) times daily. Patient not taking: Reported on 09/29/2014 08/26/14   Modena Jansky, MD  senna (SENOKOT) 8.6 MG TABS tablet Take 2 tablets (17.2 mg total) by mouth daily. Stop if having diarrhea. Patient not taking: Reported on 09/29/2014 08/26/14   Modena Jansky, MD  sertraline (ZOLOFT) 50 MG tablet Take 50 mg by mouth daily.    [provider]  VITAMIN E PO Take 1 tablet by mouth daily.    [provider]    Family History Family History  Problem Relation Age of Onset  . Hypertension Mother   . Diabetes Mother   . Hypertension Father   . Cancer Father     Social History Social History   Tobacco Use  . Smoking status: Never Smoker  . Smokeless tobacco: Never Used  Substance Use Topics  . Alcohol use: No  . Drug use: No     Allergies   Aminocaproic acid and Demerol [meperidine hcl]   Review of Systems Review of Systems  Constitutional: Negative for chills, diaphoresis and fever.  Respiratory: Negative for shortness of breath and wheezing.   Cardiovascular: Positive for chest pain (left lower rib cage). Negative for palpitations.  Gastrointestinal: Negative for abdominal pain, nausea and vomiting.  Genitourinary: Negative for difficulty urinating.  Musculoskeletal: Negative for back pain and gait problem.  Skin: Negative for rash.  Neurological: Negative for syncope and light-headedness.    Psychiatric/Behavioral: Negative for agitation.  All other systems reviewed and are negative.    Physical Exam Updated Vital Signs BP 135/67   Pulse 76   Temp 98.2 F (36.8 C) (Oral)   Resp 17   Ht 5\' 10"  (1.778 m)   Wt 124.7 kg (275 lb)   SpO2 94%   BMI 39.46 kg/m   Physical Exam  Constitutional: She is oriented to person, place, and time. She appears well-developed and well-nourished. No distress.  Sitting at bedside in no apparent distress, nontoxic-appearing.  HENT:  Head: Normocephalic and atraumatic.  Eyes: Right eye exhibits no discharge. Left eye exhibits no discharge.  Neck: Normal range of motion. No JVD present. No tracheal deviation present.  Cardiovascular: Normal rate, regular rhythm and intact distal pulses.  Pulmonary/Chest: Effort normal and breath sounds normal. No stridor. No respiratory distress. She has no wheezes. She has no rales.  Patient point tender to palpation over left anterior lower rib cage.  No overlying ecchymosis, rash or erythema.  No crepitus. There is a palpable edge to the rib where patient has tenderness.  Lungs clear to auscultation.    Abdominal: Soft. Bowel sounds are normal. There is no tenderness.  Musculoskeletal:  No leg swelling or calf tenderness.  Neurological: She is alert and oriented to person, place, and time. Coordination normal.  Skin: Skin is warm and dry. She is not diaphoretic.  Psychiatric: She has a normal mood and affect. Her behavior is normal.  Nursing note and vitals reviewed.    ED Treatments / Results  Labs (all labs ordered are listed, but only abnormal results are displayed) Labs Reviewed - No data to display  EKG None  Radiology Dg Ribs Unilateral W/chest Left  Result Date: 12/25/2017 CLINICAL DATA:  Acute left rib pain after injury. EXAM: LEFT RIBS AND CHEST - 3+ VIEW COMPARISON:  Radiographs of Oct 02, 2011. FINDINGS: No fracture or other bone lesions are seen involving the ribs. There is no  evidence of pneumothorax or pleural effusion. Both lungs are clear. Heart size and mediastinal contours are within normal limits. IMPRESSION: Normal left ribs.  No acute cardiopulmonary abnormality seen. Electronically Signed   By: Marijo Conception, M.D.   On: 12/25/2017 18:49    Procedures Procedures (including critical care time)  Medications Ordered in ED Medications  oxyCODONE-acetaminophen (PERCOCET/ROXICET) 5-325 MG per tablet 1 tablet (1 tablet Oral Given 12/25/17 1756)     Initial Impression / Assessment and Plan / ED Course  I have reviewed the triage vital signs and the nursing notes.  Pertinent labs & imaging results that were available during my care of the patient were reviewed by me and considered in my medical decision making (see chart for details).     Patient presents to the emergency department for evaluation of left lower rib cage pain which started 4 days ago after abruptly jumping backwards.  She did not hit the ground.  She reports pain is only present with movement or taking deep breaths it is reproducible to palpation.  Symptoms consistent with musculoskeletal strain, but given her exquisite tenderness to palpation will get left rib x-ray for further evaluation. I do not suspect ACS given that it is only present with movement and palpation of the chest wall and there is no associated n/v, lightheadedness, sob, radiation to the left arm or jaw. She is PERC negative and I do not suspect PE. Xray left ribs negative for acute fracture or abnormality.  Will discharge with symptomatic management including ice, muscle relaxer, NSAIDs and Tylenol.  Have counseled her on strict return precautions including chest pain that occurs outside of movement, chest pain that radiates to the left arm or jaw, shortness of breath, lightheadedness or syncope.  Patient agrees and voiced understanding to the above plan and appears reliable.  Final Clinical Impressions(s) / ED Diagnoses   Final  diagnoses:  Intercostal muscle strain, initial encounter    ED Discharge Orders        Ordered    methocarbamol (ROBAXIN) 500 MG tablet  2 times daily  12/25/17 1938       Glyn Ade, PA-C 12/25/17 1939    Fredia Sorrow, MD 12/27/17 1536

## 2017-12-25 NOTE — ED Triage Notes (Signed)
Patient c/o left lower rib cage pain x 4 days. Patient states she was refilling a windex bottle and jumped back to keep the bottle from landing on her toes. Patient states she pulled a muscle and states pain is worse with taking a deep breath, coughing, or bending over. Patient denies any SOB

## 2017-12-25 NOTE — ED Notes (Signed)
Patient transported to X-ray 

## 2017-12-25 NOTE — Discharge Instructions (Addendum)
Your exam is consistent with muscle strain of your intercostal muscles.  Please apply ice to the chest wall at least twice a day to help with your symptoms.  You can take ibuprofen 600 mg every 6 hours as needed for pain as well as Tylenol every 6 hours.  I written you prescription for muscle relaxer medicine.  This medicine can make you drowsy so please do not drive or work while taking it.  X-ray was reassuring, no broken bones or lung injury.  Return to the emergency department immediately if the pain in your left lower ribs changes or becomes concerning in any way.  Return if it occurs without movement, if you have shortness of breath, sweating, if the chest pain radiates to the left shoulder or jaw, or you feel like you are going to pass out.

## 2019-07-13 ENCOUNTER — Other Ambulatory Visit: Payer: Self-pay

## 2019-07-13 ENCOUNTER — Emergency Department (HOSPITAL_COMMUNITY): Payer: BLUE CROSS/BLUE SHIELD

## 2019-07-13 ENCOUNTER — Emergency Department (HOSPITAL_COMMUNITY)
Admission: EM | Admit: 2019-07-13 | Discharge: 2019-07-13 | Disposition: A | Discharge status: Home / Self Care | Payer: BLUE CROSS/BLUE SHIELD | Attending: Emergency Medicine | Admitting: Emergency Medicine

## 2019-07-13 ENCOUNTER — Encounter (HOSPITAL_COMMUNITY): Payer: Self-pay

## 2019-07-13 DIAGNOSIS — R319 Hematuria, unspecified: Secondary | ICD-10-CM | POA: Diagnosis present

## 2019-07-13 DIAGNOSIS — Z85850 Personal history of malignant neoplasm of thyroid: Secondary | ICD-10-CM | POA: Insufficient documentation

## 2019-07-13 DIAGNOSIS — R600 Localized edema: Secondary | ICD-10-CM | POA: Diagnosis not present

## 2019-07-13 DIAGNOSIS — Z85528 Personal history of other malignant neoplasm of kidney: Secondary | ICD-10-CM | POA: Diagnosis not present

## 2019-07-13 DIAGNOSIS — R1031 Right lower quadrant pain: Secondary | ICD-10-CM | POA: Insufficient documentation

## 2019-07-13 DIAGNOSIS — Z79899 Other long term (current) drug therapy: Secondary | ICD-10-CM | POA: Insufficient documentation

## 2019-07-13 DIAGNOSIS — Z9049 Acquired absence of other specified parts of digestive tract: Secondary | ICD-10-CM | POA: Insufficient documentation

## 2019-07-13 DIAGNOSIS — I10 Essential (primary) hypertension: Secondary | ICD-10-CM | POA: Diagnosis not present

## 2019-07-13 DIAGNOSIS — R609 Edema, unspecified: Secondary | ICD-10-CM

## 2019-07-13 DIAGNOSIS — R109 Unspecified abdominal pain: Secondary | ICD-10-CM

## 2019-07-13 LAB — CBC WITH DIFFERENTIAL/PLATELET
Abs Immature Granulocytes: 0.04 10*3/uL (ref 0.00–0.07)
Basophils Absolute: 0 10*3/uL (ref 0.0–0.1)
Basophils Relative: 0 %
Eosinophils Absolute: 0.4 10*3/uL (ref 0.0–0.5)
Eosinophils Relative: 4 %
HCT: 36.1 % (ref 36.0–46.0)
Hemoglobin: 11.9 g/dL — ABNORMAL LOW (ref 12.0–15.0)
Immature Granulocytes: 0 %
Lymphocytes Relative: 26 %
Lymphs Abs: 2.6 10*3/uL (ref 0.7–4.0)
MCH: 27.9 pg (ref 26.0–34.0)
MCHC: 33 g/dL (ref 30.0–36.0)
MCV: 84.5 fL (ref 80.0–100.0)
Monocytes Absolute: 0.7 10*3/uL (ref 0.1–1.0)
Monocytes Relative: 7 %
Neutro Abs: 6.4 10*3/uL (ref 1.7–7.7)
Neutrophils Relative %: 63 %
Platelets: 274 10*3/uL (ref 150–400)
RBC: 4.27 MIL/uL (ref 3.87–5.11)
RDW: 13.4 % (ref 11.5–15.5)
WBC: 10.1 10*3/uL (ref 4.0–10.5)
nRBC: 0 % (ref 0.0–0.2)

## 2019-07-13 LAB — COMPREHENSIVE METABOLIC PANEL WITH GFR
ALT: 27 U/L (ref 0–44)
AST: 26 U/L (ref 15–41)
Albumin: 3.7 g/dL (ref 3.5–5.0)
Alkaline Phosphatase: 81 U/L (ref 38–126)
Anion gap: 9 (ref 5–15)
BUN: 11 mg/dL (ref 6–20)
CO2: 29 mmol/L (ref 22–32)
Calcium: 8.9 mg/dL (ref 8.9–10.3)
Chloride: 100 mmol/L (ref 98–111)
Creatinine, Ser: 0.56 mg/dL (ref 0.44–1.00)
GFR calc Af Amer: >60 mL/min
GFR calc non Af Amer: >60 mL/min
Glucose, Bld: 120 mg/dL — ABNORMAL HIGH (ref 70–99)
Potassium: 3.3 mmol/L — ABNORMAL LOW (ref 3.5–5.1)
Sodium: 138 mmol/L (ref 135–145)
Total Bilirubin: 0.4 mg/dL (ref 0.3–1.2)
Total Protein: 6.8 g/dL (ref 6.5–8.1)

## 2019-07-13 LAB — URINALYSIS, ROUTINE W REFLEX MICROSCOPIC
Bacteria, UA: NONE SEEN
Bilirubin Urine: NEGATIVE
Glucose, UA: NEGATIVE mg/dL
Ketones, ur: NEGATIVE mg/dL
Leukocytes,Ua: NEGATIVE
Nitrite: NEGATIVE
Protein, ur: NEGATIVE mg/dL
RBC / HPF: >50 RBC/hpf — ABNORMAL HIGH (ref 0–5)
Specific Gravity, Urine: 1.017 (ref 1.005–1.030)
pH: 6 (ref 5.0–8.0)

## 2019-07-13 MED ORDER — PROMETHAZINE HCL 25 MG/ML IJ SOLN
12.5000 mg | Freq: Once | INTRAMUSCULAR | Status: AC
  Administered 2019-07-13: 12.5 mg via INTRAVENOUS
  Filled 2019-07-13: qty 1

## 2019-07-13 MED ORDER — PROMETHAZINE HCL 25 MG PO TABS: 25.0000 mg | ORAL_TABLET | Freq: Three times a day (TID) | ORAL | 0 refills | Status: DC | PRN

## 2019-07-13 MED ORDER — SODIUM CHLORIDE (PF) 0.9 % IJ SOLN
INTRAMUSCULAR | Status: AC
  Filled 2019-07-13: qty 50

## 2019-07-13 MED ORDER — FENTANYL CITRATE (PF) 100 MCG/2ML IJ SOLN
50.0000 ug | Freq: Once | INTRAMUSCULAR | Status: AC
  Administered 2019-07-13: 50 ug via INTRAVENOUS
  Filled 2019-07-13: qty 2

## 2019-07-13 MED ORDER — IOHEXOL 300 MG/ML  SOLN
100.0000 mL | Freq: Once | INTRAMUSCULAR | Status: AC | PRN
  Administered 2019-07-13: 100 mL via INTRAVENOUS

## 2019-07-13 MED ORDER — ONDANSETRON HCL 4 MG/2ML IJ SOLN
4.0000 mg | Freq: Once | INTRAMUSCULAR | Status: AC
  Administered 2019-07-13: 4 mg via INTRAVENOUS
  Filled 2019-07-13: qty 2

## 2019-07-13 MED ORDER — IBUPROFEN 800 MG PO TABS
800.0000 mg | ORAL_TABLET | Freq: Once | ORAL | Status: AC
  Administered 2019-07-13: 800 mg via ORAL
  Filled 2019-07-13: qty 1

## 2019-07-13 MED ORDER — MORPHINE SULFATE (PF) 4 MG/ML IV SOLN
4.0000 mg | Freq: Once | INTRAVENOUS | Status: AC
  Administered 2019-07-13: 18:00:00 4 mg via INTRAVENOUS
  Filled 2019-07-13: qty 1

## 2019-07-13 NOTE — ED Triage Notes (Signed)
Patient reports intermittent  Hematuria x 4 days Patient states she has had bilateral leg swelling x 2-3 days. Patient states she began having right lower back pain since last night.

## 2019-07-13 NOTE — ED Provider Notes (Signed)
Ultrasound ED DVT  Date/Time: 07/13/2019 10:11 PM Performed by: Maudie Flakes, MD Authorized by: Maudie Flakes, MD   Procedure details:    Indications: swelling of limb     Assessment for:  DVT   Images Archived: Yes     Limitations:  None RLE Findings:    Right common femoral vein:  Compressible   Right deep and superficial femoral veins:  Compressible   Right popliteal vein:  Compressible LLE Findings:    Left common femoral vein:  Compressible   Left deep and superficial femoral veins:  Compressible   Left popliteal vein:  Compressible IMPRESSION:   DVT:     None      Maudie Flakes, MD 07/13/19 2212

## 2019-07-13 NOTE — ED Notes (Signed)
Pt transported to CT ?

## 2019-07-13 NOTE — ED Provider Notes (Signed)
[facility] DEPT Provider Note   CSN: [id number] Arrival date & time: 07/13/19  1631     History Chief Complaint  Patient presents with  . Hematuria  . Back Pain  . Leg Swelling    Yolanda Matthews is a 49 y.o. female presenting for evaluation of hematuria, right-sided flank pain, and leg swelling.  Patient states she developed hematuria 2 days ago.  She is not having any pain at that time.  She also has some mild urinary frequency, but no dysuria.  Yesterday she developed right-sided flank pain.  It is intermittent, and sometimes radiates to the front of her abdomen.  It feels similar to when she has had a kidney stone in the past.  She has associated nausea, no vomiting.  She has been taking ibuprofen with minimal improvement.  She took Norco with no improvement.  She has not had any Zofran today.  Yesterday she had some mild bilateral leg swelling, worsened significantly today.  Patient states both legs feel extremely swollen and tender.  They feel like they are about to pop.  She takes HCTZ, which she has been taking as prescribed, but no other diuretics.  Patient states she has had mild intermittent bilateral leg swelling in the past when she is not closely watching her diet, but never anything this bad.  She denies fevers, chills, cough, chest pain, shortness of breath.  She reports a history of right renal cancer status post partial nephrectomy.  She states she was told not to take NSAIDs, but has been taking ibuprofen as this helps her pain.    HPI     Past Medical History:  Diagnosis Date  . Cancer ([place])    renal  . GERD (gastroesophageal reflux disease)   . History of kidney cancer   . Hypertension   . Kidney stones   . Kidney stones   . Polycystic ovarian syndrome   . Renal disorder   . Thyroid cancer ([place])   . Thyroid disease     Patient Active Problem List   Diagnosis Date Noted  . Hematuria   . Kidney stones   . Acute right flank pain  08/24/2014  . Hypothyroidism 08/24/2014  . Recurrent nephrolithiasis 08/24/2014  . Essential hypertension 08/24/2014  . Polycystic ovarian syndrome 08/24/2014  . Renal cell cancer ([place]) 08/24/2014  . Thyroid cancer ([place]) 08/24/2014  . Urinary tract infection 08/24/2014  . Urinary tract infectious disease   . Renal cell carcinoma ([place])   . Other specified hypothyroidism     Past Surgical History:  Procedure Laterality Date  . ABDOMINAL HYSTERECTOMY    . CESAREAN SECTION    . CHOLECYSTECTOMY    . PARTIAL NEPHRECTOMY    . TONSILLECTOMY    . TUBAL LIGATION Right 2010     OB History    Gravida  4   Para  3   Term  2   Preterm  1   AB  1   Living  3     SAB  1   TAB      Ectopic      Multiple      Live Births  3           Family History  Problem Relation Age of Onset  . Hypertension Mother   . Diabetes Mother   . Hypertension Father   . Cancer Father     Social History   Tobacco Use  . Smoking status: Never Smoker  .  Smokeless tobacco: Never Used  Substance Use Topics  . Alcohol use: No  . Drug use: No    Home Medications Prior to Admission medications   Medication Sig Start Date End Date Taking? Authorizing Provider  acetaminophen (TYLENOL) 500 MG tablet Take 1,000 mg by mouth every 6 (six) hours as needed.   Yes [provider]  FLUoxetine (PROZAC) 40 MG capsule Take 40 mg by mouth daily. 06/12/19  Yes [provider]  hydrochlorothiazide (HYDRODIURIL) 25 MG tablet Take 50 mg by mouth daily. 12/10/17  Yes [provider]  levothyroxine (SYNTHROID, LEVOTHROID) 200 MCG tablet Take 200 mcg by mouth daily before breakfast.   Yes [provider]  lisinopril (PRINIVIL,ZESTRIL) 40 MG tablet Take 40 mg by mouth daily. 03/20/17  Yes [provider]  Multiple Vitamin (MULTIVITAMIN WITH MINERALS) TABS tablet Take 1 tablet by mouth daily.   Yes [provider]  omeprazole (PRILOSEC) 20 MG capsule Take  40 mg by mouth daily. 06/28/17  Yes [provider]  Potassium 99 MG TABS Take 297 mg by mouth daily.   Yes [provider]  pravastatin (PRAVACHOL) 20 MG tablet Take 20 mg by mouth daily.   Yes [provider]  sertraline (ZOLOFT) 50 MG tablet Take 100 mg by mouth daily. 07/16/17  Yes [provider]  cefUROXime (CEFTIN) 500 MG tablet Take 1 tablet (500 mg total) by mouth 2 (two) times daily with a meal. Patient not taking: Reported on 12/25/2017 08/26/14   Modena Jansky, MD  HYDROcodone-acetaminophen (NORCO) 5-325 MG per tablet Take 2 tablets by mouth every 4 (four) hours as needed. Patient not taking: Reported on 12/25/2017 09/29/14   Elnora Morrison, MD  ibuprofen (ADVIL,MOTRIN) 200 MG tablet Take 3 tablets (600 mg total) by mouth every 6 (six) hours as needed for fever or mild pain. Patient not taking: Reported on 07/13/2019 08/26/14   Modena Jansky, MD  methocarbamol (ROBAXIN) 500 MG tablet Take 1 tablet (500 mg total) by mouth 2 (two) times daily. Patient not taking: Reported on 07/13/2019 12/25/17   Glyn Ade, PA-C  metoCLOPramide (REGLAN) 10 MG tablet Take 1 tablet (10 mg total) by mouth every 6 (six) hours as needed for nausea (nausea/headache). Patient not taking: Reported on 12/25/2017 09/29/14   Elnora Morrison, MD  ondansetron (ZOFRAN ODT) 8 MG disintegrating tablet Take 1 tablet (8 mg total) by mouth every 8 (eight) hours as needed for nausea or vomiting. Patient not taking: Reported on 12/25/2017 06/26/14   Comer Locket, PA-C  oxyCODONE-acetaminophen (PERCOCET) 5-325 MG per tablet Take 1-2 tablets by mouth every 6 (six) hours as needed for moderate pain or severe pain. Patient not taking: Reported on 12/25/2017 08/26/14   Modena Jansky, MD  polyethylene glycol [facility]) packet Take 17 g by mouth 2 (two) times daily. Patient not taking: Reported on 12/25/2017 08/26/14   Modena Jansky, MD  promethazine (PHENERGAN) 25 MG tablet Take 1 tablet (25 mg  total) by mouth every 8 (eight) hours as needed for nausea or vomiting. 07/13/19   Sharnee Douglass, PA-C  senna (SENOKOT) 8.6 MG TABS tablet Take 2 tablets (17.2 mg total) by mouth daily. Stop if having diarrhea. Patient not taking: Reported on 12/25/2017 08/26/14   Modena Jansky, MD    Allergies    Aminocaproic acid, Toradol [ketorolac tromethamine], and Demerol [meperidine hcl]  Review of Systems   Review of Systems  Cardiovascular: Positive for leg swelling.  Gastrointestinal: Positive for abdominal pain and  nausea.  Genitourinary: Positive for flank pain, frequency and hematuria.  All other systems reviewed and are negative.   Physical Exam Updated Vital Signs BP 130/70   Pulse 80   Temp 97.7 F (36.5 C) (Oral)   Resp 16   Ht 5\' 10"  (1.778 m)   Wt 122.5 kg   SpO2 96%   BMI 38.74 kg/m   Physical Exam Vitals and nursing note reviewed.  Constitutional:      General: She is not in acute distress.    Appearance: She is well-developed.     Comments: Appears nontoxic  HENT:     Head: Normocephalic and atraumatic.  Eyes:     Extraocular Movements: Extraocular movements intact.     Conjunctiva/sclera: Conjunctivae normal.     Pupils: Pupils are equal, round, and reactive to light.  Cardiovascular:     Rate and Rhythm: Normal rate and regular rhythm.     Pulses: Normal pulses.  Pulmonary:     Effort: Pulmonary effort is normal. No respiratory distress.     Breath sounds: Normal breath sounds. No wheezing.     Comments: Clear lung sounds in all fields. No crackles.  Abdominal:     General: There is distension.     Palpations: Abdomen is soft. There is no mass.     Tenderness: There is abdominal tenderness. There is right CVA tenderness. There is no guarding or rebound.     Comments: R sided CVA tenderness. R lower abd pain. abd mildly distended   Musculoskeletal:        General: Normal range of motion.     Cervical back: Normal range of motion and neck supple.      Right lower leg: Edema present.     Left lower leg: Edema present.     Comments: 2-3+ pitting edema bilaterally. No erythema  Skin:    General: Skin is warm and dry.     Capillary Refill: Capillary refill takes less than 2 seconds.  Neurological:     Mental Status: She is alert and oriented to person, place, and time.     ED Results / Procedures / Treatments   Labs (all labs ordered are listed, but only abnormal results are displayed) Labs Reviewed  URINALYSIS, ROUTINE W REFLEX MICROSCOPIC - Abnormal; Notable for the following components:      Result Value   APPearance HAZY (*)    Hgb urine dipstick LARGE (*)    RBC / HPF >50 (*)    All other components within normal limits  CBC WITH DIFFERENTIAL/PLATELET - Abnormal; Notable for the following components:   Hemoglobin 11.9 (*)    All other components within normal limits  COMPREHENSIVE METABOLIC PANEL - Abnormal; Notable for the following components:   Potassium 3.3 (*)    Glucose, Bld 120 (*)    All other components within normal limits  URINE CULTURE    EKG None  Radiology CT ABDOMEN PELVIS W CONTRAST  Result Date: 07/13/2019 CLINICAL DATA:  Abdominal distension. Nausea and vomiting. Right flank pain and back pain beginning yesterday. EXAM: CT ABDOMEN AND PELVIS WITH CONTRAST TECHNIQUE: Multidetector CT imaging of the abdomen and pelvis was performed using the standard protocol following bolus administration of intravenous contrast. CONTRAST:  18mL OMNIPAQUE IOHEXOL 300 MG/ML  SOLN COMPARISON:  08/24/2014.  01/07/2014. FINDINGS: Lower chest: Normal Hepatobiliary: Liver parenchyma appears normal. Previous cholecystectomy. Pancreas: Normal Spleen: Normal Adrenals/Urinary Tract: Adrenal glands are normal. Kidneys are normal. No cyst, mass, stone or hydronephrosis. Bladder  is normal. Stomach/Bowel: Stomach and small bowel are normal. Colon is normal. Appendix is normal. No sign of bowel obstruction or inflammation.  Vascular/Lymphatic: Aortic atherosclerosis. No aneurysm. IVC is normal. No retroperitoneal adenopathy. Reproductive: Previous hysterectomy.  No pelvic mass. Other: No free fluid or air. Musculoskeletal: Negative IMPRESSION: No abnormality seen to explain the presentation. No evidence of urinary tract stone disease or other urinary tract pathology. Previous cholecystectomy and hysterectomy. No evident bowel pathology. Aortic atherosclerosis. Electronically Signed   By: Nelson Chimes M.D.   On: 07/13/2019 20:15    Procedures Procedures (including critical care time)  Medications Ordered in ED Medications  sodium chloride (PF) 0.9 % injection (has no administration in time range)  morphine 4 MG/ML injection 4 mg (4 mg Intravenous Given 07/13/19 1809)  ondansetron (ZOFRAN) injection 4 mg (4 mg Intravenous Given 07/13/19 1808)  iohexol (OMNIPAQUE) 300 MG/ML solution 100 mL (100 mLs Intravenous Contrast Given 07/13/19 1951)  promethazine (PHENERGAN) injection 12.5 mg (12.5 mg Intravenous Given 07/13/19 2015)  fentaNYL (SUBLIMAZE) injection 50 mcg (50 mcg Intravenous Given 07/13/19 2018)  promethazine (PHENERGAN) injection 12.5 mg (12.5 mg Intravenous Given 07/13/19 2117)  ibuprofen (ADVIL) tablet 800 mg (800 mg Oral Given 07/13/19 2117)    ED Course  I have reviewed the triage vital signs and the nursing notes.  Pertinent labs & imaging results that were available during my care of the patient were reviewed by me and considered in my medical decision making (see chart for details).    MDM Rules/Calculators/A&P                      Patient presenting for evaluation of right-sided flank pain.  Physical exam shows patient who appears uncomfortable due to pain.  She is also having bilateral lower extremity swelling.  While she has a history of kidney stones, and her flank pain and hematuria are likely due to a kidney stone, I am concerned about her leg swelling.  She has a history of kidney and thyroid  cancer, as such concerned for possible new mass or tumor causing obstruction.  Will obtain labs and CT abdomen pelvis for further evaluation.  She is not having any chest pain, shortness breath, cough, and no crackles on pulmonary exam.  As such, doubt heart failure or pleural effusion.  Labs interpreted by me, overall reassuring.  No leukocytosis.  Kidney function normal.  Electrolytes stable.  Urine shows large blood and greater than 50 white cells, consistent with kidney stone.  No signs of infection.  CT abdomen pelvis negative for acute findings.  No obvious obstruction or cancer.  There is contrast in the ureters, there could be an occult kidney stone.  Based on her symptoms and history, this is likely the case.  Will have patient continue to treat as if this is a kidney stone.  Bedside ultrasound performed with Dr. Sedonia Small, negative for DVTs.  As such, will have patient use compression socks, elevate lower extremities, and follow-up with primary care for further evaluation.  Will have patient follow-up with her neurologist regarding her flank pain and hematuria if this does not improve.  At this time, patient appears safe for discharge.  Return precautions given.  Patient states she understands and agrees to plan.  Final Clinical Impression(s) / ED Diagnoses Final diagnoses:  Right flank pain  Peripheral edema    Rx / DC Orders ED Discharge Orders         Ordered    promethazine (PHENERGAN)  25 MG tablet  Every 8 hours PRN     07/13/19 2134           Franchot Heidelberg, PA-C 07/13/19 2223    Maudie Flakes, MD 07/14/19 1556

## 2019-07-13 NOTE — Discharge Instructions (Signed)
Take Tylenol and ibuprofen as needed for mild to moderate pain.  Use hydrocodone as needed for severe breakthrough pain. Use Zofran as needed for nausea or vomiting.  If this is not working, you may use Phenergan.  This may make you more tired or groggy, so have caution. Make sure you are staying well-hydrated water. Follow-up with the urologist for further evaluation of your kidneys and flank pain. Wear compression socks and keep your legs elevated when able to help with leg swelling. Follow-up with your primary care doctor as needed for further evaluation of your leg swelling. Return to the emergency room if you develop fevers, persistent vomiting, severe worsening pain, inability urinate, worsening shortness of breath or chest pain, or any new, worsening, concerning symptoms.

## 2019-07-13 NOTE — ED Notes (Signed)
Pt given hot pack for pain.

## 2019-07-14 LAB — URINE CULTURE

## 2019-11-25 ENCOUNTER — Emergency Department (HOSPITAL_COMMUNITY): Payer: BLUE CROSS/BLUE SHIELD

## 2019-11-25 ENCOUNTER — Other Ambulatory Visit: Payer: Self-pay

## 2019-11-25 ENCOUNTER — Encounter (HOSPITAL_COMMUNITY): Payer: Self-pay

## 2019-11-25 ENCOUNTER — Emergency Department (HOSPITAL_COMMUNITY)
Admission: EM | Admit: 2019-11-25 | Discharge: 2019-11-25 | Disposition: A | Discharge status: Home / Self Care | Payer: BLUE CROSS/BLUE SHIELD | Attending: Emergency Medicine | Admitting: Emergency Medicine

## 2019-11-25 DIAGNOSIS — E039 Hypothyroidism, unspecified: Secondary | ICD-10-CM | POA: Insufficient documentation

## 2019-11-25 DIAGNOSIS — R350 Frequency of micturition: Secondary | ICD-10-CM | POA: Insufficient documentation

## 2019-11-25 DIAGNOSIS — Z79899 Other long term (current) drug therapy: Secondary | ICD-10-CM | POA: Diagnosis not present

## 2019-11-25 DIAGNOSIS — R112 Nausea with vomiting, unspecified: Secondary | ICD-10-CM | POA: Diagnosis not present

## 2019-11-25 DIAGNOSIS — R319 Hematuria, unspecified: Secondary | ICD-10-CM | POA: Insufficient documentation

## 2019-11-25 DIAGNOSIS — R300 Dysuria: Secondary | ICD-10-CM | POA: Insufficient documentation

## 2019-11-25 DIAGNOSIS — R109 Unspecified abdominal pain: Secondary | ICD-10-CM | POA: Diagnosis present

## 2019-11-25 DIAGNOSIS — I10 Essential (primary) hypertension: Secondary | ICD-10-CM | POA: Diagnosis not present

## 2019-11-25 LAB — COMPREHENSIVE METABOLIC PANEL
ALT: 31 U/L (ref 0–44)
AST: 25 U/L (ref 15–41)
Albumin: 4.2 g/dL (ref 3.5–5.0)
Alkaline Phosphatase: 110 U/L (ref 38–126)
Anion gap: 13 (ref 5–15)
BUN: 13 mg/dL (ref 6–20)
CO2: 28 mmol/L (ref 22–32)
Calcium: 9.4 mg/dL (ref 8.9–10.3)
Chloride: 97 mmol/L — ABNORMAL LOW (ref 98–111)
Creatinine, Ser: 0.68 mg/dL (ref 0.44–1.00)
GFR calc Af Amer: >60 mL/min (ref >60)
GFR calc non Af Amer: >60 mL/min (ref >60)
Glucose, Bld: 101 mg/dL — ABNORMAL HIGH (ref 70–99)
Potassium: 3.4 mmol/L — ABNORMAL LOW (ref 3.5–5.1)
Sodium: 138 mmol/L (ref 135–145)
Total Bilirubin: 0.4 mg/dL (ref 0.3–1.2)
Total Protein: 7.6 g/dL (ref 6.5–8.1)

## 2019-11-25 LAB — CBC
HCT: 40.7 % (ref 36.0–46.0)
Hemoglobin: 13.4 g/dL (ref 12.0–15.0)
MCH: 27.1 pg (ref 26.0–34.0)
MCHC: 32.9 g/dL (ref 30.0–36.0)
MCV: 82.2 fL (ref 80.0–100.0)
Platelets: 309 10*3/uL (ref 150–400)
RBC: 4.95 MIL/uL (ref 3.87–5.11)
RDW: 12.4 % (ref 11.5–15.5)
WBC: 11 10*3/uL — ABNORMAL HIGH (ref 4.0–10.5)
nRBC: 0 % (ref 0.0–0.2)

## 2019-11-25 LAB — URINALYSIS, ROUTINE W REFLEX MICROSCOPIC
Bilirubin Urine: NEGATIVE
Glucose, UA: NEGATIVE mg/dL
Ketones, ur: NEGATIVE mg/dL
Leukocytes,Ua: NEGATIVE
Nitrite: NEGATIVE
Protein, ur: NEGATIVE mg/dL
RBC / HPF: >50 RBC/hpf — ABNORMAL HIGH (ref 0–5)
Specific Gravity, Urine: 1.02 (ref 1.005–1.030)
pH: 5 (ref 5.0–8.0)

## 2019-11-25 LAB — I-STAT BETA HCG BLOOD, ED (MC, WL, AP ONLY): I-stat hCG, quantitative: <5 m[IU]/mL (ref <5)

## 2019-11-25 LAB — LIPASE, BLOOD: Lipase: 22 U/L (ref 11–51)

## 2019-11-25 MED ORDER — MORPHINE SULFATE (PF) 4 MG/ML IV SOLN
4.0000 mg | Freq: Once | INTRAVENOUS | Status: AC
  Administered 2019-11-25: 4 mg via INTRAVENOUS
  Filled 2019-11-25: qty 1

## 2019-11-25 MED ORDER — PHENAZOPYRIDINE HCL 200 MG PO TABS: 200.0000 mg | ORAL_TABLET | Freq: Three times a day (TID) | ORAL | 0 refills | Status: DC | PRN

## 2019-11-25 MED ORDER — PHENAZOPYRIDINE HCL 200 MG PO TABS
200.0000 mg | ORAL_TABLET | Freq: Once | ORAL | Status: AC
  Administered 2019-11-25: 200 mg via ORAL
  Filled 2019-11-25: qty 1

## 2019-11-25 MED ORDER — ONDANSETRON HCL 4 MG/2ML IJ SOLN
4.0000 mg | Freq: Once | INTRAMUSCULAR | Status: AC
  Administered 2019-11-25: 4 mg via INTRAVENOUS
  Filled 2019-11-25: qty 2

## 2019-11-25 MED ORDER — PROMETHAZINE HCL 25 MG PO TABS: 25.0000 mg | ORAL_TABLET | Freq: Four times a day (QID) | ORAL | 0 refills | Status: DC | PRN

## 2019-11-25 MED ORDER — PROMETHAZINE HCL 25 MG/ML IJ SOLN
12.5000 mg | Freq: Once | INTRAMUSCULAR | Status: AC
  Administered 2019-11-25: 12.5 mg via INTRAVENOUS
  Filled 2019-11-25: qty 1

## 2019-11-25 MED ORDER — SODIUM CHLORIDE 0.9 % IV BOLUS
1000.0000 mL | Freq: Once | INTRAVENOUS | Status: AC
  Administered 2019-11-25: 1000 mL via INTRAVENOUS

## 2019-11-25 MED ORDER — METOCLOPRAMIDE HCL 5 MG/ML IJ SOLN
10.0000 mg | Freq: Once | INTRAMUSCULAR | Status: AC
  Administered 2019-11-25: 10 mg via INTRAVENOUS
  Filled 2019-11-25: qty 2

## 2019-11-25 MED ORDER — SODIUM CHLORIDE 0.9% FLUSH: 3.0000 mL | Freq: Once | INTRAVENOUS | Status: DC

## 2019-11-25 MED ORDER — SODIUM CHLORIDE 0.9 % IV SOLN
200.0000 mg | Freq: Once | INTRAVENOUS | Status: AC
  Administered 2019-11-25: 200 mg via INTRAVENOUS
  Filled 2019-11-25: qty 10

## 2019-11-25 NOTE — ED Triage Notes (Signed)
C/o blood in urine intermittent X1.5 weeks C/o N/V X2 days  C/O Right Flank pain and suprapubic discomfort   Patient has pebbles in a cup from her urine that she has been collecting.   Ambulatory in triage  5/10 pain.

## 2019-11-25 NOTE — ED Notes (Signed)
x2 unsuccessful attempts to get an IV and bloodwork

## 2019-11-25 NOTE — ED Notes (Signed)
Pt transported to CT ?

## 2019-11-25 NOTE — ED Provider Notes (Signed)
[facility] DEPT Provider Note   CSN: [id number] Arrival date & time: 11/25/19  1511     History Chief Complaint  Patient presents with  . Flank Pain  . Nausea    Yolanda Matthews is a 49 y.o. female.  Yolanda Matthews is a 49 y.o. female with a history of multiple kidney stones, previous renal cancer s/p partial nephrectomy, hypertension, GERD, PCOS, who presents to the ED for evaluation of right flank pain.  Pain has been coming and going over the past week and a half with associated intermittent blood in her urine, nausea and vomiting.  Patient states history of numerous kidney stones, and she is brought in several small stones that she has passed over the past week and a half, but patient reports over the past 2 days pain has been worsening when she started to have worsening suprapubic pressure and has now developed dysuria and was concerned she could be developing an infection.  She has had nausea and vomiting but no fevers or chills.  No associated left flank pain.  Reports overall this does feel similar to previous kidney stones.  She has a urologist through [facility], but was unable to get an appointment with him regarding the symptoms.  Has been taking home hydrocodone and ibuprofen without much relief of her symptoms.        Past Medical History:  Diagnosis Date  . Cancer ([place])    renal  . GERD (gastroesophageal reflux disease)   . History of kidney cancer   . Hypertension   . Kidney stones   . Kidney stones   . Polycystic ovarian syndrome   . Renal disorder   . Thyroid cancer ([place])   . Thyroid disease     Patient Active Problem List   Diagnosis Date Noted  . Hematuria   . Kidney stones   . Acute right flank pain 08/24/2014  . Hypothyroidism 08/24/2014  . Recurrent nephrolithiasis 08/24/2014  . Essential hypertension 08/24/2014  . Polycystic ovarian syndrome 08/24/2014  . Renal cell cancer ([place]) 08/24/2014  . Thyroid cancer ([place])  08/24/2014  . Urinary tract infection 08/24/2014  . Urinary tract infectious disease   . Renal cell carcinoma ([place])   . Other specified hypothyroidism     Past Surgical History:  Procedure Laterality Date  . ABDOMINAL HYSTERECTOMY    . CESAREAN SECTION    . CHOLECYSTECTOMY    . PARTIAL NEPHRECTOMY    . TONSILLECTOMY    . TUBAL LIGATION Right 2010     OB History    Gravida  4   Para  3   Term  2   Preterm  1   AB  1   Living  3     SAB  1   TAB      Ectopic      Multiple      Live Births  3           Family History  Problem Relation Age of Onset  . Hypertension Mother   . Diabetes Mother   . Hypertension Father   . Cancer Father     Social History   Tobacco Use  . Smoking status: Never Smoker  . Smokeless tobacco: Never Used  Vaping Use  . Vaping Use: Never used  Substance Use Topics  . Alcohol use: No  . Drug use: No    Home Medications Prior to Admission medications   Medication Sig Start Date End Date Taking?  Authorizing Provider  acetaminophen (TYLENOL) 500 MG tablet Take 1,000 mg by mouth every 6 (six) hours as needed.    [provider]  cefUROXime (CEFTIN) 500 MG tablet Take 1 tablet (500 mg total) by mouth 2 (two) times daily with a meal. Patient not taking: Reported on 12/25/2017 08/26/14   Modena Jansky, MD  FLUoxetine (PROZAC) 40 MG capsule Take 40 mg by mouth daily. 06/12/19   [provider]  hydrochlorothiazide (HYDRODIURIL) 25 MG tablet Take 50 mg by mouth daily. 12/10/17   [provider]  HYDROcodone-acetaminophen (NORCO) 5-325 MG per tablet Take 2 tablets by mouth every 4 (four) hours as needed. Patient not taking: Reported on 12/25/2017 09/29/14   Elnora Morrison, MD  ibuprofen (ADVIL,MOTRIN) 200 MG tablet Take 3 tablets (600 mg total) by mouth every 6 (six) hours as needed for fever or mild pain. Patient not taking: Reported on 07/13/2019 08/26/14   Modena Jansky, MD  levothyroxine (SYNTHROID,  LEVOTHROID) 200 MCG tablet Take 200 mcg by mouth daily before breakfast.    [provider]  lisinopril (PRINIVIL,ZESTRIL) 40 MG tablet Take 40 mg by mouth daily. 03/20/17   [provider]  methocarbamol (ROBAXIN) 500 MG tablet Take 1 tablet (500 mg total) by mouth 2 (two) times daily. Patient not taking: Reported on 07/13/2019 12/25/17   Glyn Ade, PA-C  metoCLOPramide (REGLAN) 10 MG tablet Take 1 tablet (10 mg total) by mouth every 6 (six) hours as needed for nausea (nausea/headache). Patient not taking: Reported on 12/25/2017 09/29/14   Elnora Morrison, MD  Multiple Vitamin (MULTIVITAMIN WITH MINERALS) TABS tablet Take 1 tablet by mouth daily.    [provider]  omeprazole (PRILOSEC) 20 MG capsule Take 40 mg by mouth daily. 06/28/17   [provider]  ondansetron (ZOFRAN ODT) 8 MG disintegrating tablet Take 1 tablet (8 mg total) by mouth every 8 (eight) hours as needed for nausea or vomiting. Patient not taking: Reported on 12/25/2017 06/26/14   Comer Locket, PA-C  oxyCODONE-acetaminophen (PERCOCET) 5-325 MG per tablet Take 1-2 tablets by mouth every 6 (six) hours as needed for moderate pain or severe pain. Patient not taking: Reported on 12/25/2017 08/26/14   Modena Jansky, MD  phenazopyridine (PYRIDIUM) 200 MG tablet Take 1 tablet (200 mg total) by mouth 3 (three) times daily as needed for pain. 11/25/19   Jacqlyn Larsen, PA-C  polyethylene glycol [facility]) packet Take 17 g by mouth 2 (two) times daily. Patient not taking: Reported on 12/25/2017 08/26/14   Modena Jansky, MD  Potassium 99 MG TABS Take 297 mg by mouth daily.    [provider]  pravastatin (PRAVACHOL) 20 MG tablet Take 20 mg by mouth daily.    [provider]  promethazine (PHENERGAN) 25 MG tablet Take 1 tablet (25 mg total) by mouth every 6 (six) hours as needed for nausea or vomiting. 11/25/19   Jacqlyn Larsen, PA-C  senna (SENOKOT) 8.6 MG TABS tablet Take 2 tablets (17.2 mg  total) by mouth daily. Stop if having diarrhea. Patient not taking: Reported on 12/25/2017 08/26/14   Modena Jansky, MD  sertraline (ZOLOFT) 50 MG tablet Take 100 mg by mouth daily. 07/16/17   [provider]    Allergies    Aminocaproic acid, Toradol [ketorolac tromethamine], and Demerol [meperidine hcl]  Review of Systems   Review of Systems  Constitutional: Negative for chills and fever.  HENT: Negative.   Respiratory: Negative for cough and shortness of  breath.   Cardiovascular: Negative for chest pain.  Gastrointestinal: Positive for abdominal pain, nausea and vomiting. Negative for diarrhea.  Genitourinary: Positive for dysuria, flank pain, frequency and hematuria. Negative for vaginal bleeding and vaginal discharge.  Musculoskeletal: Negative for arthralgias and myalgias.  Skin: Negative for color change and rash.  Neurological: Negative for dizziness and light-headedness.  All other systems reviewed and are negative.   Physical Exam Updated Vital Signs BP (!) 171/101 (BP Location: Left Arm)   Pulse 87   Temp 98.4 F (36.9 C) (Oral)   Resp 20   Ht 5\' 10"  (1.778 m)   Wt 136.1 kg   SpO2 97%   BMI 43.05 kg/m   Physical Exam Vitals and nursing note reviewed.  Constitutional:      General: She is not in acute distress.    Appearance: Normal appearance. She is well-developed. She is not ill-appearing or diaphoretic.     Comments: Patient appears uncomfortable, but is well-appearing and in no distress  HENT:     Head: Normocephalic and atraumatic.     Mouth/Throat:     Mouth: Mucous membranes are moist.     Pharynx: Oropharynx is clear.  Eyes:     General:        Right eye: No discharge.        Left eye: No discharge.  Cardiovascular:     Rate and Rhythm: Normal rate and regular rhythm.     Heart sounds: Normal heart sounds. No murmur heard.  No friction rub. No gallop.   Pulmonary:     Effort: Pulmonary effort is normal. No respiratory distress.      Breath sounds: Normal breath sounds. No wheezing or rales.     Comments: Respirations equal and unlabored, patient able to speak in full sentences, lungs clear to auscultation bilaterally Abdominal:     General: Bowel sounds are normal. There is no distension.     Palpations: Abdomen is soft. There is no mass.     Tenderness: There is no abdominal tenderness. There is no guarding.     Comments: Abdomen soft, nondistended, nontender to palpation in all quadrants without guarding or peritoneal signs there is some right-sided flank tenderness, none on the left  Musculoskeletal:        General: No deformity.     Cervical back: Neck supple.  Skin:    General: Skin is warm and dry.     Capillary Refill: Capillary refill takes less than 2 seconds.  Neurological:     Mental Status: She is alert.     Coordination: Coordination normal.     Comments: Speech is clear, able to follow commands Moves extremities without ataxia, coordination intact  Psychiatric:        Mood and Affect: Mood normal.        Behavior: Behavior normal.     ED Results / Procedures / Treatments   Labs (all labs ordered are listed, but only abnormal results are displayed) Labs Reviewed  COMPREHENSIVE METABOLIC PANEL - Abnormal; Notable for the following components:      Result Value   Potassium 3.4 (*)    Chloride 97 (*)    Glucose, Bld 101 (*)    All other components within normal limits  CBC - Abnormal; Notable for the following components:   WBC 11.0 (*)    All other components within normal limits  URINALYSIS, ROUTINE W REFLEX MICROSCOPIC - Abnormal; Notable for the following components:   Hgb urine dipstick LARGE (*)  RBC / HPF >50 (*)    Bacteria, UA RARE (*)    All other components within normal limits  URINE CULTURE  LIPASE, BLOOD  I-STAT BETA HCG BLOOD, ED ([facility], [facility], [facility] ONLY)    EKG None  Radiology CT Renal Stone Study  Result Date: 11/25/2019 CLINICAL DATA:  Right flank pain with hematuria  EXAM: CT ABDOMEN AND PELVIS WITHOUT CONTRAST TECHNIQUE: Multidetector CT imaging of the abdomen and pelvis was performed following the standard protocol without IV contrast. COMPARISON:  CT 07/13/2019, ultrasound 08/24/2014 FINDINGS: Lower chest: Lung bases demonstrate no acute consolidation or effusion. Hepatobiliary: No focal liver abnormality is seen. Status post cholecystectomy. No biliary dilatation. Pancreas: Unremarkable. No pancreatic ductal dilatation or surrounding inflammatory changes. Spleen: Normal in size without focal abnormality. Adrenals/Urinary Tract: Adrenal glands are unremarkable. Kidneys show no hydronephrosis. Punctate stone lower pole right kidney on coronal views. No ureteral stone. Bladder is unremarkable. Stomach/Bowel: Stomach is within normal limits. Appendix appears normal. No evidence of bowel wall thickening, distention, or inflammatory changes. Vascular/Lymphatic: Mild aortic atherosclerosis. No aneurysm. No suspicious nodes. Reproductive: Status post hysterectomy.  2 cm left adnexal cyst. Other: No free air or free fluid. Musculoskeletal: No acute or suspicious osseous abnormality. IMPRESSION: 1. Punctate nonobstructing stone in the right kidney. No hydronephrosis or ureteral stone. Aortic Atherosclerosis (ICD10-I70.0). Electronically Signed   By: Donavan Foil M.D.   On: 11/25/2019 17:48    Procedures Procedures (including critical care time)  Medications Ordered in ED Medications  sodium chloride 0.9 % bolus 1,000 mL (0 mLs Intravenous Stopped 11/25/19 2140)  ondansetron (ZOFRAN) injection 4 mg (4 mg Intravenous Given 11/25/19 1705)  morphine 4 MG/ML injection 4 mg (4 mg Intravenous Given 11/25/19 1705)  promethazine (PHENERGAN) injection 12.5 mg (12.5 mg Intravenous Given 11/25/19 1943)  lidocaine (XYLOCAINE) 200 mg in sodium chloride 0.9 % 100 mL IVPB (0 mg Intravenous Stopped 11/25/19 2014)  phenazopyridine (PYRIDIUM) tablet 200 mg (200 mg Oral Given 11/25/19 1946)    metoCLOPramide (REGLAN) injection 10 mg (10 mg Intravenous Given 11/25/19 2109)    ED Course  I have reviewed the triage vital signs and the nursing notes.  Pertinent labs & imaging results that were available during my care of the patient were reviewed by me and considered in my medical decision making (see chart for details).  MDM Rules/Calculators/A&P                         49 year old female presents with right-sided flank pain intermittently over the past week and a half with intermittent hematuria and nausea and vomiting.  Pain worsening since yesterday now with suprapubic pressure and dysuria.  Chart review shows that patient has had numerous previous renal stones, and sees urology, this has been quite a while since she has had a CT scan, given that she has brought in multiple past stones but is still having worsening pain will check basic labs, urinalysis and CT renal stone study.  Will give meds for pain control.  I have independently ordered, reviewed and interpreted all labs and imaging: CBC: Very mild leukocytosis, normal hemoglobin CMP: Minimal hypokalemia of 3.4, no other significant electrolyte derangements requiring intervention, normal renal and liver function Lipase: WNL UA: Large amount of RBCs, no white blood cells, leukocytes or nitrates, rare bacteria present, given urinary symptoms will send for culture.  CT renal stone study shows a punctate nonobstructing stone in the right kidney but no evidence of hydronephrosis or ureteral stone,  no other acute changes noted on CT.  Given blood in the urine but reassuring CT without signs of infection I suspect recently passed stones with continued ureteral spasm and renal colic.  Patient has gotten minimal relief with initial pain meds, is allergic to Toradol, will try IV lidocaine, patient is also nauseated again requesting additional antiemetics.  After lidocaine patient did get some relief.  She was given an additional medication  for nausea and vomiting, discussed reassuring work-up with her and at this time feel she is stable for discharge home with close follow-up with her urologist.  Suspect continued ureteral spasm, patient has medications at home to use to treat this, have discussed strict return precautions and appropriate outpatient follow-up and she expresses understanding and agreement with plan.   Final Clinical Impression(s) / ED Diagnoses Final diagnoses:  Right flank pain  Hematuria, unspecified type  Non-intractable vomiting with nausea, unspecified vomiting type    Rx / DC Orders ED Discharge Orders         Ordered    promethazine (PHENERGAN) 25 MG tablet  Every 6 hours PRN     Discontinue  Reprint     11/25/19 2114    phenazopyridine (PYRIDIUM) 200 MG tablet  3 times daily PRN     Discontinue  Reprint     11/25/19 2114           Jacqlyn Larsen, PA-C 12/01/19 0416    Tegeler, Gwenyth Allegra, MD 12/01/19 [phone]

## 2019-11-25 NOTE — Discharge Instructions (Signed)
Your work-up did not show any evidence of a urinary tract infection, your urine did have blood in it and I suspect this is due to recently passed stones that can continue to cause some ureteral spasm and pain.  Use your home ibuprofen and prescribed pain medication, you can use prescribed Pyridium and prescribed nausea medication as well.  Please call tomorrow morning to schedule follow-up appointment with your urologist.  Return to the ED if you develop fevers, persistent vomiting, worsening pain or any other new or concerning symptoms.

## 2019-11-27 LAB — URINE CULTURE: Culture: 30000 — AB

## 2020-09-03 ENCOUNTER — Other Ambulatory Visit: Payer: Self-pay

## 2020-09-03 ENCOUNTER — Emergency Department (HOSPITAL_COMMUNITY): Payer: BLUE CROSS/BLUE SHIELD

## 2020-09-03 ENCOUNTER — Encounter (HOSPITAL_COMMUNITY): Payer: Self-pay

## 2020-09-03 ENCOUNTER — Emergency Department (HOSPITAL_COMMUNITY)
Admission: EM | Admit: 2020-09-03 | Discharge: 2020-09-04 | Disposition: A | Discharge status: Home / Self Care | Payer: BLUE CROSS/BLUE SHIELD | Attending: Emergency Medicine | Admitting: Emergency Medicine

## 2020-09-03 DIAGNOSIS — Z85528 Personal history of other malignant neoplasm of kidney: Secondary | ICD-10-CM | POA: Diagnosis not present

## 2020-09-03 DIAGNOSIS — N3001 Acute cystitis with hematuria: Secondary | ICD-10-CM | POA: Diagnosis not present

## 2020-09-03 DIAGNOSIS — Z87442 Personal history of urinary calculi: Secondary | ICD-10-CM | POA: Insufficient documentation

## 2020-09-03 DIAGNOSIS — R1030 Lower abdominal pain, unspecified: Secondary | ICD-10-CM | POA: Diagnosis present

## 2020-09-03 DIAGNOSIS — K219 Gastro-esophageal reflux disease without esophagitis: Secondary | ICD-10-CM | POA: Diagnosis not present

## 2020-09-03 DIAGNOSIS — E039 Hypothyroidism, unspecified: Secondary | ICD-10-CM | POA: Insufficient documentation

## 2020-09-03 DIAGNOSIS — R112 Nausea with vomiting, unspecified: Secondary | ICD-10-CM | POA: Diagnosis not present

## 2020-09-03 DIAGNOSIS — R197 Diarrhea, unspecified: Secondary | ICD-10-CM | POA: Insufficient documentation

## 2020-09-03 DIAGNOSIS — R109 Unspecified abdominal pain: Secondary | ICD-10-CM

## 2020-09-03 DIAGNOSIS — I10 Essential (primary) hypertension: Secondary | ICD-10-CM | POA: Diagnosis not present

## 2020-09-03 DIAGNOSIS — Z9851 Tubal ligation status: Secondary | ICD-10-CM | POA: Insufficient documentation

## 2020-09-03 DIAGNOSIS — Z79899 Other long term (current) drug therapy: Secondary | ICD-10-CM | POA: Insufficient documentation

## 2020-09-03 LAB — COMPREHENSIVE METABOLIC PANEL
ALT: 45 U/L — ABNORMAL HIGH (ref 0–44)
AST: 29 U/L (ref 15–41)
Albumin: 4.2 g/dL (ref 3.5–5.0)
Alkaline Phosphatase: 113 U/L (ref 38–126)
Anion gap: 10 (ref 5–15)
BUN: 16 mg/dL (ref 6–20)
CO2: 25 mmol/L (ref 22–32)
Calcium: 9.4 mg/dL (ref 8.9–10.3)
Chloride: 101 mmol/L (ref 98–111)
Creatinine, Ser: 0.55 mg/dL (ref 0.44–1.00)
GFR, Estimated: >60 mL/min (ref >60)
Glucose, Bld: 97 mg/dL (ref 70–99)
Potassium: 3.1 mmol/L — ABNORMAL LOW (ref 3.5–5.1)
Sodium: 136 mmol/L (ref 135–145)
Total Bilirubin: 0.5 mg/dL (ref 0.3–1.2)
Total Protein: 7.6 g/dL (ref 6.5–8.1)

## 2020-09-03 LAB — URINALYSIS, ROUTINE W REFLEX MICROSCOPIC
Bacteria, UA: NONE SEEN
Bilirubin Urine: NEGATIVE
Glucose, UA: NEGATIVE mg/dL
Ketones, ur: NEGATIVE mg/dL
Nitrite: NEGATIVE
Protein, ur: NEGATIVE mg/dL
RBC / HPF: >50 RBC/hpf — ABNORMAL HIGH (ref 0–5)
Specific Gravity, Urine: 1.018 (ref 1.005–1.030)
pH: 6 (ref 5.0–8.0)

## 2020-09-03 LAB — CBC
HCT: 40.6 % (ref 36.0–46.0)
Hemoglobin: 13.6 g/dL (ref 12.0–15.0)
MCH: 27.8 pg (ref 26.0–34.0)
MCHC: 33.5 g/dL (ref 30.0–36.0)
MCV: 82.9 fL (ref 80.0–100.0)
Platelets: 348 10*3/uL (ref 150–400)
RBC: 4.9 MIL/uL (ref 3.87–5.11)
RDW: 13.1 % (ref 11.5–15.5)
WBC: 9.6 10*3/uL (ref 4.0–10.5)
nRBC: 0 % (ref 0.0–0.2)

## 2020-09-03 LAB — LIPASE, BLOOD: Lipase: 32 U/L (ref 11–51)

## 2020-09-03 LAB — I-STAT BETA HCG BLOOD, ED (NOT ORDERABLE): I-stat hCG, quantitative: 49.6 m[IU]/mL — ABNORMAL HIGH (ref <5)

## 2020-09-03 NOTE — ED Triage Notes (Signed)
Emergency Medicine Provider Triage Evaluation Note  Shalissa Easterwood , a 50 y.o. female  was evaluated in triage.  Pt complains of right-sided flank pain radiating into the right abdomen, hematuria, nausea, vomiting.  Review of Systems  Positive: As above Negative: Fever, dysuria  Physical Exam  BP (!) 142/95 (BP Location: Left Arm)   Pulse 71   Temp 98.7 F (37.1 C) (Oral)   Resp 16   Ht 5\' 10"  (1.778 m)   Wt 108.9 kg   SpO2 96%   BMI 34.44 kg/m  Gen:   Awake, no distress   HEENT:  Atraumatic  Resp:  Normal effort Cardiac:  Normal rate  Abd:   Nondistended, right-sided CVA tenderness, mild right sided abdominal tenderness, negative McBurney's MSK:   Moves extremities without difficulty  Neuro:  Speech clear   Medical Decision Making  Medically screening exam initiated at 5:17 PM.  Appropriate orders placed.  Onesty Clair was informed that the remainder of the evaluation will be completed by another provider, this initial triage assessment does not replace that evaluation, and the importance of remaining in the ED until their evaluation is complete.  Clinical Impression  50 year old female with right-sided flank pain, hematuria, nausea, vomiting.  Prior history of kidney stones.  Prior history of renal cell carcinoma.  Plan for labs, UA, CT renal study.  Stable for further evaluation.   Garald Balding, PA-C 09/03/20 1718

## 2020-09-03 NOTE — ED Triage Notes (Signed)
Patient c/o emesis, right flank pain, hematuria, and abdominal pain. Patieant states she had some discomfort yesterday, but then it went away until today.

## 2020-09-04 ENCOUNTER — Encounter (HOSPITAL_COMMUNITY): Payer: Self-pay | Admitting: Physician Assistant

## 2020-09-04 MED ORDER — MORPHINE SULFATE (PF) 4 MG/ML IV SOLN
4.0000 mg | Freq: Once | INTRAVENOUS | Status: AC
  Administered 2020-09-04: 4 mg via INTRAVENOUS
  Filled 2020-09-04: qty 1

## 2020-09-04 MED ORDER — SODIUM CHLORIDE 0.9 % IV BOLUS
1000.0000 mL | Freq: Once | INTRAVENOUS | Status: AC
  Administered 2020-09-04: 1000 mL via INTRAVENOUS

## 2020-09-04 MED ORDER — OXYCODONE HCL 5 MG PO TABS: 5.0000 mg | ORAL_TABLET | Freq: Four times a day (QID) | ORAL | 0 refills | Status: DC | PRN

## 2020-09-04 MED ORDER — ONDANSETRON HCL 4 MG PO TABS: 4.0000 mg | ORAL_TABLET | Freq: Three times a day (TID) | ORAL | 0 refills | Status: DC | PRN

## 2020-09-04 MED ORDER — CEPHALEXIN 500 MG PO CAPS: 500.0000 mg | ORAL_CAPSULE | Freq: Four times a day (QID) | ORAL | 0 refills | Status: DC

## 2020-09-04 MED ORDER — SODIUM CHLORIDE 0.9 % IV SOLN
1.0000 g | Freq: Once | INTRAVENOUS | Status: AC
  Administered 2020-09-04: 1 g via INTRAVENOUS
  Filled 2020-09-04: qty 10

## 2020-09-04 MED ORDER — MORPHINE SULFATE (PF) 4 MG/ML IV SOLN
4.0000 mg | Freq: Once | INTRAVENOUS | Status: AC
Start: 2020-09-04 — End: 2020-09-04
  Administered 2020-09-04: 4 mg via INTRAVENOUS
  Filled 2020-09-04: qty 1

## 2020-09-04 MED ORDER — ONDANSETRON HCL 4 MG/2ML IJ SOLN
4.0000 mg | Freq: Once | INTRAMUSCULAR | Status: AC
  Administered 2020-09-04: 4 mg via INTRAVENOUS
  Filled 2020-09-04: qty 2

## 2020-09-04 MED ORDER — SODIUM CHLORIDE 0.9 % IV SOLN
12.5000 mg | Freq: Four times a day (QID) | INTRAVENOUS | Status: DC | PRN
  Administered 2020-09-04: 12.5 mg via INTRAVENOUS
  Filled 2020-09-04 (×2): qty 0.5

## 2020-09-04 MED ORDER — PROMETHAZINE HCL 25 MG RE SUPP: 25.0000 mg | Freq: Four times a day (QID) | RECTAL | 0 refills | Status: DC | PRN

## 2020-09-04 NOTE — ED Notes (Signed)
Discharged no concerns at this time.  °

## 2020-09-04 NOTE — Discharge Instructions (Addendum)
1. Medications: zofran or Phenergan as needed for vomiting, oxycodone for severe pain, Tylenol for mild to moderate pain, Keflex is her antibiotic, usual home medications 2. Treatment: rest, drink plenty of fluids, advance diet slowly 3. Follow Up: Please followup with your primary doctor or urologist in 2 days for discussion of your diagnoses and further evaluation after today's visit; if you do not have a primary care doctor use the resource guide provided to find one; Please return to the ER for persistent vomiting, high fevers or worsening symptoms

## 2020-09-04 NOTE — ED Provider Notes (Signed)
[facility] DEPT Provider Note   CSN: [id number] Arrival date & time: 09/03/20  1650     History Chief Complaint  Patient presents with  . Hematuria  . Flank Pain  . Emesis  . Abdominal Pain    Yolanda Matthews is a 50 y.o. female presents to the Emergency Department complaining of gradual, persistent, progressively worsening right flank pain onset this morning. Associated symptoms include materia, lower abdominal pain, nausea, vomiting and several episodes of diarrhea.  Patient reports that her emesis has been nonbloody and nonbilious.  Additionally she has had urinary urgency and frequency but no specific dysuria.  Denies fevers or chills, headache, neck pain, chest pain, shortness of breath, weakness, dizziness, syncope.  Patient does report she feels dehydrated as she has been unable to keep anything down.  Patient reports she was moving furniture yesterday while spring cleaning and initially thought that the ache in her flank was a muscle strain but it worsened this morning and began to radiate into her right lower abdomen.  Patient reports sometimes her kidney stones feel like this.  Patient with previous hysterectomy.  No specific aggravating or alleviating factors.  Continues to rate her pain as moderate.  No treatments prior to arrival.  The history is provided by the patient and medical records. No language interpreter was used.       Past Medical History:  Diagnosis Date  . Cancer ([place])    renal  . GERD (gastroesophageal reflux disease)   . History of kidney cancer   . Hypertension   . Kidney stones   . Kidney stones   . Polycystic ovarian syndrome   . Renal disorder   . Thyroid cancer ([place])   . Thyroid disease     Patient Active Problem List   Diagnosis Date Noted  . Hematuria   . Kidney stones   . Acute right flank pain 08/24/2014  . Hypothyroidism 08/24/2014  . Recurrent nephrolithiasis 08/24/2014  . Essential hypertension  08/24/2014  . Polycystic ovarian syndrome 08/24/2014  . Renal cell cancer ([place]) 08/24/2014  . Thyroid cancer ([place]) 08/24/2014  . Urinary tract infection 08/24/2014  . Urinary tract infectious disease   . Renal cell carcinoma ([place])   . Other specified hypothyroidism     Past Surgical History:  Procedure Laterality Date  . ABDOMINAL HYSTERECTOMY    . CESAREAN SECTION    . CHOLECYSTECTOMY    . PARTIAL NEPHRECTOMY    . s.a.d.i.e.    . TONSILLECTOMY    . TUBAL LIGATION Right 2010     OB History    Gravida  4   Para  3   Term  2   Preterm  1   AB  1   Living  3     SAB  1   IAB      Ectopic      Multiple      Live Births  3           Family History  Problem Relation Age of Onset  . Hypertension Mother   . Diabetes Mother   . Hypertension Father   . Cancer Father     Social History   Tobacco Use  . Smoking status: Never Smoker  . Smokeless tobacco: Never Used  Vaping Use  . Vaping Use: Never used  Substance Use Topics  . Alcohol use: No  . Drug use: No    Home Medications Prior to Admission medications   Medication Sig  Start Date End Date Taking? Authorizing Provider  acetaminophen (TYLENOL) 500 MG tablet Take 1,000 mg by mouth every 6 (six) hours as needed for mild pain.   Yes [provider]  ACIDOPHILUS LACTOBACILLUS PO Take 1 tablet by mouth daily.   Yes [provider]  BIOTIN PO Take 1 tablet by mouth daily.   Yes [provider]  Calcium 600-400 MG-UNIT CHEW Chew 2 each by mouth daily.   Yes [provider]  cephALEXin (KEFLEX) 500 MG capsule Take 1 capsule (500 mg total) by mouth 4 (four) times daily. 09/04/20  Yes Aquinnah Devin, Jarrett Soho, PA-C  hydrochlorothiazide (HYDRODIURIL) 25 MG tablet Take 25 mg by mouth daily. 12/10/17  Yes [provider]  levothyroxine (SYNTHROID) 112 MCG tablet Take 224 mcg by mouth daily before breakfast.   Yes [provider]  lisinopril (PRINIVIL,ZESTRIL) 40  MG tablet Take 20 mg by mouth daily. 03/20/17  Yes [provider]  Multiple Vitamins-Minerals (BARIATRIC MULTIVITAMINS/IRON PO) Take 2 capsules by mouth daily.   Yes [provider]  omeprazole (PRILOSEC) 20 MG capsule Take 40 mg by mouth daily. 06/28/17  Yes [provider]  ondansetron (ZOFRAN) 4 MG tablet Take 1 tablet (4 mg total) by mouth every 8 (eight) hours as needed for nausea or vomiting. 09/04/20  Yes Martese Vanatta, Jarrett Soho, PA-C  oxyCODONE (ROXICODONE) 5 MG immediate release tablet Take 1 tablet (5 mg total) by mouth every 6 (six) hours as needed for severe pain. 09/04/20  Yes Tj Kitchings, Jarrett Soho, PA-C  promethazine (PHENERGAN) 25 MG suppository Place 1 suppository (25 mg total) rectally every 6 (six) hours as needed for nausea or vomiting. 09/04/20  Yes Kam Rahimi, Jarrett Soho, PA-C  vitamin B-12 (CYANOCOBALAMIN) 100 MCG tablet Take 200 mcg by mouth daily.   Yes [provider]    Allergies    Covid-19 mrna vac 5-11y pfizer [covid-19 mrna vacc (moderna)], Ketorolac tromethamine, Aminocaproic acid, and Demerol [meperidine hcl]  Review of Systems   Review of Systems  Constitutional: Negative for appetite change, diaphoresis, fatigue, fever and unexpected weight change.  HENT: Negative for mouth sores.   Eyes: Negative for visual disturbance.  Respiratory: Negative for cough, chest tightness, shortness of breath and wheezing.   Cardiovascular: Negative for chest pain.  Gastrointestinal: Positive for abdominal pain and vomiting. Negative for constipation, diarrhea and nausea.  Endocrine: Negative for polydipsia, polyphagia and polyuria.  Genitourinary: Positive for flank pain and hematuria. Negative for dysuria, frequency and urgency.  Musculoskeletal: Negative for back pain and neck stiffness.  Skin: Negative for rash.  Allergic/Immunologic: Negative for immunocompromised state.  Neurological: Negative for syncope, light-headedness and headaches.   Hematological: Does not bruise/bleed easily.  Psychiatric/Behavioral: Negative for sleep disturbance. The patient is not nervous/anxious.     Physical Exam Updated Vital Signs BP 116/74 (BP Location: Left Arm)   Pulse 70   Temp 98.3 F (36.8 C) (Oral)   Resp 16   Ht 5\' 10"  (1.778 m)   Wt 108.9 kg   SpO2 98%   BMI 34.44 kg/m   Physical Exam Vitals and nursing note reviewed.  Constitutional:      General: She is not in acute distress.    Appearance: She is not diaphoretic.  HENT:     Head: Normocephalic.  Eyes:     General: No scleral icterus.    Conjunctiva/sclera: Conjunctivae normal.  Cardiovascular:     Rate and Rhythm: Normal rate and regular rhythm.     Pulses: Normal pulses.  Radial pulses are 2+ on the right side and 2+ on the left side.  Pulmonary:     Effort: No tachypnea, accessory muscle usage, prolonged expiration, respiratory distress or retractions.     Breath sounds: No stridor.     Comments: Equal chest rise. No increased work of breathing. Abdominal:     General: There is no distension.     Palpations: Abdomen is soft.     Tenderness: There is abdominal tenderness ( Very mild tenderness without rebound or guarding) in the right lower quadrant and suprapubic area. There is no right CVA tenderness, left CVA tenderness, guarding or rebound.  Musculoskeletal:     Cervical back: Normal range of motion.     Comments: Moves all extremities equally and without difficulty.  Skin:    General: Skin is warm and dry.     Capillary Refill: Capillary refill takes less than 2 seconds.  Neurological:     Mental Status: She is alert.     GCS: GCS eye subscore is 4. GCS verbal subscore is 5. GCS motor subscore is 6.     Comments: Speech is clear and goal oriented.  Psychiatric:        Mood and Affect: Mood normal.     ED Results / Procedures / Treatments   Labs (all labs ordered are listed, but only abnormal results are displayed) Labs Reviewed   COMPREHENSIVE METABOLIC PANEL - Abnormal; Notable for the following components:      Result Value   Potassium 3.1 (*)    ALT 45 (*)    All other components within normal limits  URINALYSIS, ROUTINE W REFLEX MICROSCOPIC - Abnormal; Notable for the following components:   APPearance HAZY (*)    Hgb urine dipstick LARGE (*)    Leukocytes,Ua MODERATE (*)    RBC / HPF >50 (*)    Non Squamous Epithelial 0-5 (*)    All other components within normal limits  I-STAT BETA HCG BLOOD, ED (NOT ORDERABLE) - Abnormal; Notable for the following components:   I-stat hCG, quantitative 49.6 (*)    All other components within normal limits  URINE CULTURE  CBC  LIPASE, BLOOD  I-STAT BETA HCG BLOOD, ED ([facility], [facility], [facility] ONLY)    Radiology CT Renal Stone Study  Result Date: 09/03/2020 CLINICAL DATA:  Right flank pain, history of kidney stones. Hematuria. EXAM: CT ABDOMEN AND PELVIS WITHOUT CONTRAST TECHNIQUE: Multidetector CT imaging of the abdomen and pelvis was performed following the standard protocol without IV contrast. COMPARISON:  CT abdomen pelvis 11/27/2019 FINDINGS: Lower chest: Unchanged small nodule at the left lung base, stable since 2016, considered benign. No acute finding. Evaluation of the abdominal viscera somewhat limited by the lack of IV contrast. Hepatobiliary: No focal liver abnormality is seen. Status post cholecystectomy. No biliary dilatation. Pancreas: Unremarkable. No surrounding inflammatory changes. Spleen: Normal in size without focal abnormality. Adrenals/Urinary Tract: Adrenal glands are stable in appearance. There is a nonobstructing punctate calculus in the inferior left kidney. There is a nonobstructing punctate calculus in the inferior right kidney. No large renal mass. Urinary bladder is unremarkable. Stomach/Bowel: Status post gastric sleeve surgery. Appendix appears normal. No evidence of bowel wall thickening, distention or obstruction. Vascular/Lymphatic: Mild aortic  atherosclerosis. No enlarged abdominal or pelvic lymph nodes. Reproductive: Status post hysterectomy. No adnexal masses. Other: No abdominal wall hernia or abnormality. No abdominopelvic ascites. Musculoskeletal: No acute or significant osseous findings. IMPRESSION: 1. No CT finding to explain the patient's right flank pain. 2.  Bilateral nonobstructing punctate nephrolithiasis. 3. Aortic atherosclerosis. Aortic Atherosclerosis (ICD10-I70.0). Electronically Signed   By: Audie Pinto M.D.   On: 09/03/2020 19:34    Procedures Procedures   Medications Ordered in ED Medications  promethazine (PHENERGAN) 12.5 mg in sodium chloride 0.9 % 50 mL IVPB (12.5 mg Intravenous New Bag/Given 09/04/20 0332)  ondansetron (ZOFRAN) injection 4 mg (4 mg Intravenous Given 09/04/20 0123)  morphine 4 MG/ML injection 4 mg (4 mg Intravenous Given 09/04/20 0125)  cefTRIAXone (ROCEPHIN) 1 g in sodium chloride 0.9 % 100 mL IVPB (0 g Intravenous Stopped 09/04/20 0229)  sodium chloride 0.9 % bolus 1,000 mL (0 mLs Intravenous Stopped 09/04/20 0229)  morphine 4 MG/ML injection 4 mg (4 mg Intravenous Given 09/04/20 0332)  sodium chloride 0.9 % bolus 1,000 mL (1,000 mLs Intravenous New Bag/Given 09/04/20 0341)    ED Course  I have reviewed the triage vital signs and the nursing notes.  Pertinent labs & imaging results that were available during my care of the patient were reviewed by me and considered in my medical decision making (see chart for details).  Clinical Course as of 09/04/20 9476  Sat Sep 04, 2020  0301 Patient continues to have pain and nausea.  No additional vomiting at this time.  Will give repeat pain control and nausea medication. [HM]  0301 Potassium(!): 3.1 Noted - will give PO K+ once patient can tolerate PO [HM]  0302 Chalmers Guest): MODERATE Urinalysis shows moderate leukocytes and white blood cells.  No bacteria is noted however patient does have some UTI symptoms and previous urinalysis with kidney  stones have been negative for leukocytes and without white blood cells.  Rocephin given here in the emergency department [HM]  [phone] Reports continued nausea and pain.  Medications ordered.  Will give additional fluids if she continues to complain of feeling dehydrated. [HM]    Clinical Course User Index [HM] Yanitza Shvartsman, Gwenlyn Perking   MDM Rules/Calculators/A&P                           Presents with a history of nephrolithiasis, gastric bypass and cholecystectomy with right-sided flank pain radiating into her right lower quadrant.  Provider in triage during MSE felt this was most likely to be secondary to renal colic.  CT renal obtained which shows bilateral nonobstructing punctate nephrolithiasis but no evidence of pyelonephritis, appendicitis, colitis or internal hernia.  Patient with nausea vomiting and diarrhea and recent heavy lifting and moving of furniture.  Right flank pain may be secondary to renal colic with recently passed stone, muscle strain from vomiting or moving heavy objects.  Urinalysis with significant amount of blood and some white blood cells but no bacteria.  Patient has some symptoms consistent with urinary tract infection.  Will give Rocephin here in the emergency department and culture urine.  Will additionally give Zofran and morphine for pain control.  Will p.o. trial and continue to monitor.  6:01 AM Patient feeling much better.  Is tolerating p.o. without difficulty.  Pain and nausea have improved significantly.  Will refer to urology for further evaluation.  Will write for Keflex for potential urinary tract infection.  Patient may stop this medication if urine culture returns negative.  Discussed reasons to return to the emergency department.  Patient states understanding and is in agreement with the plan.   Final Clinical Impression(s) / ED Diagnoses Final diagnoses:  Right flank pain  Acute cystitis with hematuria    Rx /  DC Orders ED Discharge Orders          Ordered    oxyCODONE (ROXICODONE) 5 MG immediate release tablet  Every 6 hours PRN        09/04/20 0606    cephALEXin (KEFLEX) 500 MG capsule  4 times daily        09/04/20 0606    ondansetron (ZOFRAN) 4 MG tablet  Every 8 hours PRN        09/04/20 0606    promethazine (PHENERGAN) 25 MG suppository  Every 6 hours PRN        09/04/20 0606           Veron Senner, Gwenlyn Perking 09/04/20 [id number]    Maudie Flakes, MD 09/04/20 [phone]

## 2020-09-05 LAB — URINE CULTURE: Culture: <10000 — AB

## 2020-10-11 ENCOUNTER — Emergency Department (HOSPITAL_COMMUNITY): Payer: BLUE CROSS/BLUE SHIELD

## 2020-10-11 ENCOUNTER — Encounter (HOSPITAL_COMMUNITY): Payer: Self-pay

## 2020-10-11 ENCOUNTER — Other Ambulatory Visit: Payer: Self-pay

## 2020-10-11 ENCOUNTER — Emergency Department (HOSPITAL_COMMUNITY)
Admission: EM | Admit: 2020-10-11 | Discharge: 2020-10-12 | Disposition: A | Discharge status: Home / Self Care | Payer: BLUE CROSS/BLUE SHIELD | Attending: Emergency Medicine | Admitting: Emergency Medicine

## 2020-10-11 DIAGNOSIS — Z79899 Other long term (current) drug therapy: Secondary | ICD-10-CM | POA: Insufficient documentation

## 2020-10-11 DIAGNOSIS — N3090 Cystitis, unspecified without hematuria: Secondary | ICD-10-CM

## 2020-10-11 DIAGNOSIS — Z87442 Personal history of urinary calculi: Secondary | ICD-10-CM | POA: Insufficient documentation

## 2020-10-11 DIAGNOSIS — I10 Essential (primary) hypertension: Secondary | ICD-10-CM | POA: Diagnosis not present

## 2020-10-11 DIAGNOSIS — Z85528 Personal history of other malignant neoplasm of kidney: Secondary | ICD-10-CM | POA: Insufficient documentation

## 2020-10-11 DIAGNOSIS — E039 Hypothyroidism, unspecified: Secondary | ICD-10-CM | POA: Insufficient documentation

## 2020-10-11 DIAGNOSIS — B9689 Other specified bacterial agents as the cause of diseases classified elsewhere: Secondary | ICD-10-CM | POA: Diagnosis not present

## 2020-10-11 DIAGNOSIS — R1032 Left lower quadrant pain: Secondary | ICD-10-CM | POA: Diagnosis present

## 2020-10-11 DIAGNOSIS — K219 Gastro-esophageal reflux disease without esophagitis: Secondary | ICD-10-CM | POA: Insufficient documentation

## 2020-10-11 DIAGNOSIS — Z85850 Personal history of malignant neoplasm of thyroid: Secondary | ICD-10-CM | POA: Diagnosis not present

## 2020-10-11 DIAGNOSIS — N3000 Acute cystitis without hematuria: Secondary | ICD-10-CM | POA: Insufficient documentation

## 2020-10-11 LAB — PREGNANCY, URINE: Preg Test, Ur: NEGATIVE

## 2020-10-11 LAB — URINALYSIS, ROUTINE W REFLEX MICROSCOPIC
Bacteria, UA: NONE SEEN
Bilirubin Urine: NEGATIVE
Glucose, UA: NEGATIVE mg/dL
Ketones, ur: NEGATIVE mg/dL
Leukocytes,Ua: NEGATIVE
Nitrite: NEGATIVE
Protein, ur: NEGATIVE mg/dL
RBC / HPF: >50 RBC/hpf — ABNORMAL HIGH (ref 0–5)
Specific Gravity, Urine: 1.018 (ref 1.005–1.030)
pH: 5 (ref 5.0–8.0)

## 2020-10-11 LAB — CBC WITH DIFFERENTIAL/PLATELET
Abs Immature Granulocytes: 0.04 10*3/uL (ref 0.00–0.07)
Basophils Absolute: 0.1 10*3/uL (ref 0.0–0.1)
Basophils Relative: 1 %
Eosinophils Absolute: 0.2 10*3/uL (ref 0.0–0.5)
Eosinophils Relative: 2 %
HCT: 46 % (ref 36.0–46.0)
Hemoglobin: 15 g/dL (ref 12.0–15.0)
Immature Granulocytes: 0 %
Lymphocytes Relative: 21 %
Lymphs Abs: 2.2 10*3/uL (ref 0.7–4.0)
MCH: 27.6 pg (ref 26.0–34.0)
MCHC: 32.6 g/dL (ref 30.0–36.0)
MCV: 84.6 fL (ref 80.0–100.0)
Monocytes Absolute: 0.6 10*3/uL (ref 0.1–1.0)
Monocytes Relative: 5 %
Neutro Abs: 7.6 10*3/uL (ref 1.7–7.7)
Neutrophils Relative %: 71 %
Platelets: 404 10*3/uL — ABNORMAL HIGH (ref 150–400)
RBC: 5.44 MIL/uL — ABNORMAL HIGH (ref 3.87–5.11)
RDW: 13 % (ref 11.5–15.5)
WBC: 10.6 10*3/uL — ABNORMAL HIGH (ref 4.0–10.5)
nRBC: 0 % (ref 0.0–0.2)

## 2020-10-11 LAB — COMPREHENSIVE METABOLIC PANEL
ALT: 33 U/L (ref 0–44)
AST: 22 U/L (ref 15–41)
Albumin: 4.6 g/dL (ref 3.5–5.0)
Alkaline Phosphatase: 113 U/L (ref 38–126)
Anion gap: 7 (ref 5–15)
BUN: 22 mg/dL — ABNORMAL HIGH (ref 6–20)
CO2: 23 mmol/L (ref 22–32)
Calcium: 9.6 mg/dL (ref 8.9–10.3)
Chloride: 104 mmol/L (ref 98–111)
Creatinine, Ser: 0.7 mg/dL (ref 0.44–1.00)
GFR, Estimated: >60 mL/min (ref >60)
Glucose, Bld: 95 mg/dL (ref 70–99)
Potassium: 4 mmol/L (ref 3.5–5.1)
Sodium: 134 mmol/L — ABNORMAL LOW (ref 135–145)
Total Bilirubin: 0.4 mg/dL (ref 0.3–1.2)
Total Protein: 8.3 g/dL — ABNORMAL HIGH (ref 6.5–8.1)

## 2020-10-11 LAB — I-STAT BETA HCG BLOOD, ED (MC, WL, AP ONLY): I-stat hCG, quantitative: 53 m[IU]/mL — ABNORMAL HIGH (ref <5)

## 2020-10-11 MED ORDER — HYDROMORPHONE HCL 1 MG/ML IJ SOLN
1.0000 mg | Freq: Once | INTRAMUSCULAR | Status: AC
  Administered 2020-10-11: 1 mg via INTRAMUSCULAR
  Filled 2020-10-11: qty 1

## 2020-10-11 MED ORDER — ONDANSETRON HCL 4 MG/2ML IJ SOLN
4.0000 mg | Freq: Once | INTRAMUSCULAR | Status: AC
  Administered 2020-10-11: 4 mg via INTRAVENOUS
  Filled 2020-10-11: qty 2

## 2020-10-11 MED ORDER — CEFPODOXIME PROXETIL 200 MG PO TABS: 200.0000 mg | ORAL_TABLET | Freq: Two times a day (BID) | ORAL | 0 refills | Status: AC

## 2020-10-11 MED ORDER — ONDANSETRON 8 MG PO TBDP
8.0000 mg | ORAL_TABLET | Freq: Once | ORAL | Status: AC
Start: 2020-10-11 — End: 2020-10-11
  Administered 2020-10-11: 8 mg via ORAL
  Filled 2020-10-11: qty 1

## 2020-10-11 MED ORDER — SODIUM CHLORIDE 0.9 % IV SOLN
1.0000 g | Freq: Once | INTRAVENOUS | Status: AC
  Administered 2020-10-11: 1 g via INTRAVENOUS
  Filled 2020-10-11: qty 10

## 2020-10-11 MED ORDER — SODIUM CHLORIDE 0.9 % IV SOLN
25.0000 mg | Freq: Once | INTRAVENOUS | Status: AC
  Administered 2020-10-11: 25 mg via INTRAVENOUS
  Filled 2020-10-11: qty 25
  Filled 2020-10-11: qty 1

## 2020-10-11 MED ORDER — SODIUM CHLORIDE 0.9 % IV BOLUS
1000.0000 mL | Freq: Once | INTRAVENOUS | Status: AC
  Administered 2020-10-11: 1000 mL via INTRAVENOUS

## 2020-10-11 MED ORDER — PROMETHAZINE HCL 25 MG PO TABS: 25.0000 mg | ORAL_TABLET | Freq: Four times a day (QID) | ORAL | 0 refills | Status: DC | PRN

## 2020-10-11 NOTE — ED Provider Notes (Signed)
[facility] DEPT Provider Note   CSN: [id number] Arrival date & time: 10/11/20  1421     History Chief Complaint  Patient presents with  . Flank Pain  . Dysuria    Yolanda Matthews is a 50 y.o. female.  History of renal colic and is status post intervention for at least 1 stone.  She has also had renal cell carcinoma that was found and treated.  The history is provided by the patient.  Flank Pain This is a new problem. The current episode started yesterday. The problem occurs constantly. The problem has been gradually worsening. Associated symptoms include abdominal pain (left lower quadrant). Pertinent negatives include no chest pain, no headaches and no shortness of breath. Nothing aggravates the symptoms. Nothing relieves the symptoms. She has tried rest for the symptoms. The treatment provided no relief.  Dysuria Associated symptoms: abdominal pain (left lower quadrant) and flank pain   Associated symptoms: no fever and no vomiting        Past Medical History:  Diagnosis Date  . Cancer ([place])    renal  . GERD (gastroesophageal reflux disease)   . History of kidney cancer   . Hypertension   . Kidney stones   . Kidney stones   . Polycystic ovarian syndrome   . Renal disorder   . Thyroid cancer ([place])   . Thyroid disease     Patient Active Problem List   Diagnosis Date Noted  . Hematuria   . Kidney stones   . Acute right flank pain 08/24/2014  . Hypothyroidism 08/24/2014  . Recurrent nephrolithiasis 08/24/2014  . Essential hypertension 08/24/2014  . Polycystic ovarian syndrome 08/24/2014  . Renal cell cancer ([place]) 08/24/2014  . Thyroid cancer ([place]) 08/24/2014  . Urinary tract infection 08/24/2014  . Urinary tract infectious disease   . Renal cell carcinoma ([place])   . Other specified hypothyroidism     Past Surgical History:  Procedure Laterality Date  . ABDOMINAL HYSTERECTOMY    . CESAREAN SECTION    . CHOLECYSTECTOMY    .  PARTIAL NEPHRECTOMY    . s.a.d.i.e.    . TONSILLECTOMY    . TUBAL LIGATION Right 2010     OB History    Gravida  4   Para  3   Term  2   Preterm  1   AB  1   Living  3     SAB  1   IAB      Ectopic      Multiple      Live Births  3           Family History  Problem Relation Age of Onset  . Hypertension Mother   . Diabetes Mother   . Hypertension Father   . Cancer Father     Social History   Tobacco Use  . Smoking status: Never Smoker  . Smokeless tobacco: Never Used  Vaping Use  . Vaping Use: Never used  Substance Use Topics  . Alcohol use: No  . Drug use: No    Home Medications Prior to Admission medications   Medication Sig Start Date End Date Taking? Authorizing Provider  acetaminophen (TYLENOL) 500 MG tablet Take 1,000 mg by mouth every 6 (six) hours as needed for mild pain.    [provider]  ACIDOPHILUS LACTOBACILLUS PO Take 1 tablet by mouth daily.    [provider]  BIOTIN PO Take 1 tablet by mouth daily.    [provider]  Calcium 600-400 MG-UNIT CHEW Chew 2 each by mouth daily.    [provider]  cephALEXin (KEFLEX) 500 MG capsule Take 1 capsule (500 mg total) by mouth 4 (four) times daily. 09/04/20   Muthersbaugh, Jarrett Soho, PA-C  hydrochlorothiazide (HYDRODIURIL) 25 MG tablet Take 25 mg by mouth daily. 12/10/17   [provider]  levothyroxine (SYNTHROID) 112 MCG tablet Take 224 mcg by mouth daily before breakfast.    [provider]  lisinopril (PRINIVIL,ZESTRIL) 40 MG tablet Take 20 mg by mouth daily. 03/20/17   [provider]  Multiple Vitamins-Minerals (BARIATRIC MULTIVITAMINS/IRON PO) Take 2 capsules by mouth daily.    [provider]  omeprazole (PRILOSEC) 20 MG capsule Take 40 mg by mouth daily. 06/28/17   [provider]  ondansetron (ZOFRAN) 4 MG tablet Take 1 tablet (4 mg total) by mouth every 8 (eight) hours as needed for nausea or vomiting. 09/04/20    Muthersbaugh, Jarrett Soho, PA-C  oxyCODONE (ROXICODONE) 5 MG immediate release tablet Take 1 tablet (5 mg total) by mouth every 6 (six) hours as needed for severe pain. 09/04/20   Muthersbaugh, Jarrett Soho, PA-C  promethazine (PHENERGAN) 25 MG suppository Place 1 suppository (25 mg total) rectally every 6 (six) hours as needed for nausea or vomiting. 09/04/20   Muthersbaugh, Jarrett Soho, PA-C  vitamin B-12 (CYANOCOBALAMIN) 100 MCG tablet Take 200 mcg by mouth daily.    [provider]    Allergies    Covid-19 mrna vac 5-11y pfizer [covid-19 mrna vacc (moderna)], Ketorolac tromethamine, Aminocaproic acid, and Demerol [meperidine hcl]  Review of Systems   Review of Systems  Constitutional: Negative for chills and fever.  HENT: Negative for ear pain and sore throat.   Eyes: Negative for pain and visual disturbance.  Respiratory: Negative for cough and shortness of breath.   Cardiovascular: Negative for chest pain and palpitations.  Gastrointestinal: Positive for abdominal pain (left lower quadrant). Negative for vomiting.  Genitourinary: Positive for dysuria and flank pain. Negative for hematuria.  Musculoskeletal: Negative for arthralgias and back pain.  Skin: Negative for color change and rash.  Neurological: Negative for seizures, syncope and headaches.  All other systems reviewed and are negative.   Physical Exam Updated Vital Signs BP (!) 138/95   Pulse 90   Temp 97.8 F (36.6 C) (Oral)   Resp 18   Ht 5\' 10"  (1.778 m)   Wt 106.6 kg   SpO2 100%   BMI 33.72 kg/m   Physical Exam Vitals and nursing note reviewed.  HENT:     Head: Normocephalic and atraumatic.  Eyes:     General: No scleral icterus. Pulmonary:     Effort: Pulmonary effort is normal. No respiratory distress.  Abdominal:     Palpations: Abdomen is soft.     Tenderness: There is no abdominal tenderness. There is left CVA tenderness. There is no guarding.  Musculoskeletal:     Cervical back: Normal range of  motion.  Skin:    General: Skin is warm and dry.  Neurological:     Mental Status: She is alert.  Psychiatric:        Mood and Affect: Mood normal.     ED Results / Procedures / Treatments   Labs (all labs ordered are listed, but only abnormal results are displayed) Labs Reviewed  CBC WITH DIFFERENTIAL/PLATELET - Abnormal; Notable for the following components:      Result Value   WBC 10.6 (*)    RBC 5.44 (*)  Platelets 404 (*)    All other components within normal limits  COMPREHENSIVE METABOLIC PANEL - Abnormal; Notable for the following components:   Sodium 134 (*)    BUN 22 (*)    Total Protein 8.3 (*)    All other components within normal limits  URINALYSIS, ROUTINE W REFLEX MICROSCOPIC - Abnormal; Notable for the following components:   APPearance HAZY (*)    Hgb urine dipstick LARGE (*)    RBC / HPF >50 (*)    All other components within normal limits  I-STAT BETA HCG BLOOD, ED ([facility], [facility], [facility] ONLY) - Abnormal; Notable for the following components:   I-stat hCG, quantitative 53.0 (*)    All other components within normal limits  PREGNANCY, URINE    EKG None  Radiology CT Renal Stone Study  Result Date: 10/11/2020 CLINICAL DATA:  Emesis, dysuria and left flank pain for 1 day, known urolithiasis EXAM: CT ABDOMEN AND PELVIS WITHOUT CONTRAST TECHNIQUE: Multidetector CT imaging of the abdomen and pelvis was performed following the standard protocol without IV contrast. COMPARISON:  CT 10/04/2020, 09/03/2020 FINDINGS: Lower chest: Lung bases are clear. Normal heart size. No pericardial effusion. Hepatobiliary: No visible focal liver lesion. Smooth liver surface contour. Normal liver attenuation. Prior cholecystectomy. No significant biliary ductal dilatation accounting for post cholecystectomy reservoir effect. Pancreas: No pancreatic ductal dilatation or surrounding inflammatory changes. Spleen: Normal in size. No concerning splenic lesions. Adrenals/Urinary Tract: Normal a  adrenal glands. Kidneys are symmetric in size and normally located. No visible or contour deforming renal lesion is seen. A punctate nonobstructing calculus is present the left kidney measuring to 2 mm, unchanged from comparison prior (coronal series 5, image 80). No visible obstructing urolith or hydronephrosis is seen. No gross bladder abnormality accounting for degree of distension. No visible bladder calculus or debris. Stomach/Bowel: Postsurgical changes from prior sleeve gastrectomy and antecolic gastric bypass. Patent anastomoses. No acute complication. No small bowel thickening or dilatation. Normal, noninflamed appendix in the right lower quadrant. No colonic dilatation or wall thickening. No evidence of bowel obstruction. Vascular/Lymphatic: Atherosclerotic calcifications within the abdominal aorta and branch vessels. No aneurysm or ectasia. No enlarged abdominopelvic lymph nodes. Reproductive: Uterus is surgically absent. Stable fluid attenuation 2.1 cm cyst in the left adnexa (2/74). No concerning adnexal lesions. Other: No abdominopelvic free fluid or free gas. No bowel containing hernias. Postsurgical changes of the anterior abdominal wall. Mild ventral diastasis. Musculoskeletal: No acute osseous abnormality or suspicious osseous lesion. IMPRESSION: 1. No evidence of obstructing urolithiasis or hydronephrosis, nor other acute CT evident urinary tract abnormality at this time. 2. Stable punctate 2 mm nonobstructing calculus in the lower pole left kidney. 3. Postsurgical changes from what appear to be a sleeve gastrectomy and antecolic bypass. No acute complication. 4. Stable 2.1 cm fluid attenuation cyst in the left adnexa. No follow-up imaging recommended. Note: This recommendation does not apply to premenarchal patients and to those with increased risk (genetic, family history, elevated tumor markers or other high-risk factors) of ovarian cancer. Reference: JACR 2020 Feb; 17(2):248-254 5. Prior  cholecystectomy. 6.  Aortic Atherosclerosis (ICD10-I70.0). Electronically Signed   By: Lovena Le M.D.   On: 10/11/2020 19:45    Procedures Procedures   Medications Ordered in ED Medications  promethazine (PHENERGAN) 25 mg in sodium chloride 0.9 % 50 mL IVPB (has no administration in time range)  HYDROmorphone (DILAUDID) injection 1 mg (1 mg Intramuscular Given 10/11/20 2202)  ondansetron (ZOFRAN-ODT) disintegrating tablet 8 mg (8 mg Oral Given 10/11/20  2021)  sodium chloride 0.9 % bolus 1,000 mL (1,000 mLs Intravenous New Bag/Given 10/11/20 2230)  ondansetron (ZOFRAN) injection 4 mg (4 mg Intravenous Given 10/11/20 2232)  cefTRIAXone (ROCEPHIN) 1 g in sodium chloride 0.9 % 100 mL IVPB (0 g Intravenous Stopped 10/11/20 2307)    ED Course  I have reviewed the triage vital signs and the nursing notes.  Pertinent labs & imaging results that were available during my care of the patient were reviewed by me and considered in my medical decision making (see chart for details).    MDM Rules/Calculators/A&P                          Ms. Manbeck came in complaining of flank pain and dysuria.  She has a history of kidney stones, and she has needed intervention in the past.  She also has a history of renal cell carcinoma.  The patient was evaluated for evidence of a pyelonephritis, renal colic, or other intra-abdominal or urologic emergency.  I did consider musculoskeletal etiologies, but these seem less likely.  Unfortunately, her urinalysis was somewhat equivocal.  I decided to treat her for possible cystitis.  She took Keflex in April, and I wanted to treat her with Bennie Pierini because of the flank pain.  This will cover pyelonephritis to a greater extent.  I did counsel her to see her urologist, and I told her she may need cystoscopy or other procedures if symptoms are persistent despite this course of treatment.  Furthermore, urine culture is pending.  Finally, she does have an ovarian cyst, and I have  counseled her about this finding.  No further imaging is recommended. Final Clinical Impression(s) / ED Diagnoses Final diagnoses:  Cystitis    Rx / DC Orders ED Discharge Orders         Ordered    promethazine (PHENERGAN) 25 MG tablet  Every 6 hours PRN        10/11/20 2338    cefpodoxime (VANTIN) 200 MG tablet  2 times daily        10/11/20 2338           Arnaldo Natal, MD 10/11/20 2342

## 2020-10-11 NOTE — ED Provider Notes (Signed)
Emergency Medicine Provider Triage Evaluation Note  Yolanda Matthews 50 y.o. female was evaluated in triage.  Pt complains of dysuria, flank pain on the left side that began yesterday.  Patient reports she has a history of kidney stone 5 weeks ago.  She states that she was discharged home with medications and felt better.  She states yesterday symptoms started acutely.  Since then she has had nausea/vomiting.  She states she is peeing less frequently.  No hematuria.  No fever.   Review of Systems  Positive: Flank pain, n/v Negative: Fever, hematuria   Physical Exam  BP 134/82   Pulse 70   Temp 98.2 F (36.8 C) (Oral)   Resp 18   Ht 5\' 4"  (1.626 m)   Wt 65.8 kg   SpO2 100%   BMI 24.89 kg/m  Gen:   Awake, no distress   HEENT:  Atraumatic  Resp:  Normal effort  Cardiac:  Normal rate  Abd:   Nondistended, -left-sided CVA tenderness noted.  Diffuse tenderness noted to left lower abdomen. MSK:   Moves extremities without difficulty  Neuro:  Speech clear   Other:      Medical Decision Making  Medically screening exam initiated at 2:33 PM  Appropriate orders placed.  Suzy Kugel was informed that the remainder of the evaluation will be completed by another provider, this initial triage assessment does not replace that evaluation. They are counseled that they will need to remain in the ED until the completion of their workup, including full H&P and results of any tests.  Risks of leaving the emergency department prior to completion of treatment were discussed. Patient was advised to inform ED staff if they are leaving before their treatment is complete. The patient acknowledged these risks and time was allowed for questions.     The patient appears stable so that the remainder of the MSE may be completed by another provider.    Clinical Impression  abd pain, flank pain   Portions of this note were generated with Dragon dictation software. Dictation errors may occur despite best attempts at  proofreading.      Volanda Napoleon, PA-C 10/11/20 1434    Lennice Sites, DO 10/11/20 1523

## 2020-10-11 NOTE — ED Triage Notes (Addendum)
Patient c/o emesis, dysuria, and left flank pain since yesterday. Patient states a known kidney stone.

## 2020-10-12 MED ORDER — HYDROMORPHONE HCL 1 MG/ML IJ SOLN
1.0000 mg | Freq: Once | INTRAMUSCULAR | Status: AC
Start: 2020-10-12 — End: 2020-10-12
  Administered 2020-10-12: 1 mg via INTRAVENOUS
  Filled 2020-10-12: qty 1

## 2021-08-29 ENCOUNTER — Emergency Department (HOSPITAL_COMMUNITY)
Admission: EM | Admit: 2021-08-29 | Discharge: 2021-08-29 | Disposition: A | Discharge status: Home / Self Care | Payer: BLUE CROSS/BLUE SHIELD | Source: Home / Self Care | Attending: Student | Admitting: Student

## 2021-08-29 ENCOUNTER — Emergency Department (HOSPITAL_COMMUNITY): Payer: BLUE CROSS/BLUE SHIELD

## 2021-08-29 ENCOUNTER — Encounter (HOSPITAL_COMMUNITY): Payer: Self-pay

## 2021-08-29 DIAGNOSIS — Z85528 Personal history of other malignant neoplasm of kidney: Secondary | ICD-10-CM | POA: Insufficient documentation

## 2021-08-29 DIAGNOSIS — Z79899 Other long term (current) drug therapy: Secondary | ICD-10-CM | POA: Insufficient documentation

## 2021-08-29 DIAGNOSIS — R110 Nausea: Secondary | ICD-10-CM | POA: Insufficient documentation

## 2021-08-29 DIAGNOSIS — N390 Urinary tract infection, site not specified: Secondary | ICD-10-CM | POA: Insufficient documentation

## 2021-08-29 DIAGNOSIS — I10 Essential (primary) hypertension: Secondary | ICD-10-CM | POA: Insufficient documentation

## 2021-08-29 DIAGNOSIS — N3000 Acute cystitis without hematuria: Secondary | ICD-10-CM | POA: Diagnosis not present

## 2021-08-29 DIAGNOSIS — Z85850 Personal history of malignant neoplasm of thyroid: Secondary | ICD-10-CM | POA: Insufficient documentation

## 2021-08-29 LAB — COMPREHENSIVE METABOLIC PANEL
ALT: 39 U/L (ref 0–44)
AST: 33 U/L (ref 15–41)
Albumin: 4.5 g/dL (ref 3.5–5.0)
Alkaline Phosphatase: 99 U/L (ref 38–126)
Anion gap: 8 (ref 5–15)
BUN: 20 mg/dL (ref 6–20)
CO2: 28 mmol/L (ref 22–32)
Calcium: 9 mg/dL (ref 8.9–10.3)
Chloride: 102 mmol/L (ref 98–111)
Creatinine, Ser: 0.76 mg/dL (ref 0.44–1.00)
GFR, Estimated: >60 mL/min (ref >60)
Glucose, Bld: 94 mg/dL (ref 70–99)
Potassium: 3.1 mmol/L — ABNORMAL LOW (ref 3.5–5.1)
Sodium: 138 mmol/L (ref 135–145)
Total Bilirubin: 0.7 mg/dL (ref 0.3–1.2)
Total Protein: 7.6 g/dL (ref 6.5–8.1)

## 2021-08-29 LAB — CBC WITH DIFFERENTIAL/PLATELET
Abs Immature Granulocytes: 0.01 10*3/uL (ref 0.00–0.07)
Basophils Absolute: 0.1 10*3/uL (ref 0.0–0.1)
Basophils Relative: 1 %
Eosinophils Absolute: 0.2 10*3/uL (ref 0.0–0.5)
Eosinophils Relative: 2 %
HCT: 42.7 % (ref 36.0–46.0)
Hemoglobin: 14.6 g/dL (ref 12.0–15.0)
Immature Granulocytes: 0 %
Lymphocytes Relative: 23 %
Lymphs Abs: 1.8 10*3/uL (ref 0.7–4.0)
MCH: 29.7 pg (ref 26.0–34.0)
MCHC: 34.2 g/dL (ref 30.0–36.0)
MCV: 87 fL (ref 80.0–100.0)
Monocytes Absolute: 0.4 10*3/uL (ref 0.1–1.0)
Monocytes Relative: 6 %
Neutro Abs: 5.3 10*3/uL (ref 1.7–7.7)
Neutrophils Relative %: 68 %
Platelets: 321 10*3/uL (ref 150–400)
RBC: 4.91 MIL/uL (ref 3.87–5.11)
RDW: 12.8 % (ref 11.5–15.5)
WBC: 7.8 10*3/uL (ref 4.0–10.5)
nRBC: 0 % (ref 0.0–0.2)

## 2021-08-29 LAB — URINALYSIS, ROUTINE W REFLEX MICROSCOPIC

## 2021-08-29 LAB — URINALYSIS, MICROSCOPIC (REFLEX)

## 2021-08-29 MED ORDER — MORPHINE SULFATE (PF) 4 MG/ML IV SOLN
3.0000 mg | Freq: Once | INTRAVENOUS | Status: AC
  Administered 2021-08-29: 3 mg via INTRAVENOUS
  Filled 2021-08-29: qty 1

## 2021-08-29 MED ORDER — CEFADROXIL 500 MG PO CAPS
500.0000 mg | ORAL_CAPSULE | Freq: Two times a day (BID) | ORAL | Status: DC
  Administered 2021-08-29: 500 mg via ORAL
  Filled 2021-08-29: qty 1

## 2021-08-29 MED ORDER — DIPHENHYDRAMINE HCL 50 MG/ML IJ SOLN
25.0000 mg | Freq: Once | INTRAMUSCULAR | Status: AC
  Administered 2021-08-29: 25 mg via INTRAVENOUS
  Filled 2021-08-29: qty 1

## 2021-08-29 MED ORDER — OXYCODONE-ACETAMINOPHEN 5-325 MG PO TABS: 1.0000 | ORAL_TABLET | Freq: Four times a day (QID) | ORAL | 0 refills | Status: AC | PRN

## 2021-08-29 MED ORDER — ONDANSETRON HCL 4 MG/2ML IJ SOLN
4.0000 mg | Freq: Once | INTRAMUSCULAR | Status: AC
Start: 2021-08-29 — End: 2021-08-29
  Administered 2021-08-29: 4 mg via INTRAVENOUS
  Filled 2021-08-29: qty 2

## 2021-08-29 MED ORDER — CEFADROXIL 500 MG PO CAPS: 500.0000 mg | ORAL_CAPSULE | Freq: Two times a day (BID) | ORAL | 0 refills | Status: DC

## 2021-08-29 MED ORDER — ACETAMINOPHEN 500 MG PO TABS
1000.0000 mg | ORAL_TABLET | Freq: Once | ORAL | Status: AC
Start: 2021-08-29 — End: 2021-08-29
  Administered 2021-08-29: 1000 mg via ORAL
  Filled 2021-08-29: qty 2

## 2021-08-29 MED ORDER — PROMETHAZINE HCL 25 MG PO TABS: 25.0000 mg | ORAL_TABLET | Freq: Four times a day (QID) | ORAL | 0 refills | Status: AC | PRN

## 2021-08-29 NOTE — ED Provider Notes (Signed)
?[facility] DEPT ?Provider Note ? ?CSN: [id number] ?Arrival date & time: 08/29/21 1323 ? ?Chief Complaint(s) ?Nausea and Flank Pain ? ?HPI ?Yolanda Matthews is a 51 y.o. female with PMH nephrolithiasis, PCOS, renal cell carcinoma who presents the emergency department for evaluation of nausea, dysuria, flank pain.  Patient states that over the weekend she has passed a small kidney stone which she brought with her but today she started to experience increased frequency, dysuria, right-sided flank pain and nausea.  She has been taking Azo at home and states that this is the only thing that is helped.  She denies vomiting, chest pain, shortness of breath, headache, fever or other systemic symptoms. ? ? ?Flank Pain ? ? ?Past Medical History ?Past Medical History:  ?Diagnosis Date  ? Cancer [facility])   ? renal  ? GERD (gastroesophageal reflux disease)   ? History of kidney cancer   ? Hypertension   ? Kidney stones   ? Kidney stones   ? Polycystic ovarian syndrome   ? Renal disorder   ? Thyroid cancer ([place])   ? Thyroid disease   ? ?Patient Active Problem List  ? Diagnosis Date Noted  ? Hematuria   ? Kidney stones   ? Acute right flank pain 08/24/2014  ? Hypothyroidism 08/24/2014  ? Recurrent nephrolithiasis 08/24/2014  ? Essential hypertension 08/24/2014  ? Polycystic ovarian syndrome 08/24/2014  ? Renal cell cancer ([place]) 08/24/2014  ? Thyroid cancer ([place]) 08/24/2014  ? Urinary tract infection 08/24/2014  ? Urinary tract infectious disease   ? Renal cell carcinoma ([place])   ? Other specified hypothyroidism   ? ?Home Medication(s) ?Prior to Admission medications   ?Medication Sig Start Date End Date Taking? Authorizing Provider  ?cefadroxil (DURICEF) 500 MG capsule Take 1 capsule (500 mg total) by mouth 2 (two) times daily for 7 days. 08/29/21 09/05/21 Yes Marshal Eskew, MD  ?oxyCODONE-acetaminophen (PERCOCET/ROXICET) 5-325 MG tablet Take 1 tablet by mouth every 6 (six) hours as needed for severe pain.  08/29/21  Yes Naoko Diperna, MD  ?acetaminophen (TYLENOL) 500 MG tablet Take 1,000 mg by mouth every 6 (six) hours as needed for mild pain.    [provider]  ?ACIDOPHILUS LACTOBACILLUS PO Take 1 tablet by mouth daily.    [provider]  ?BIOTIN PO Take 1 tablet by mouth daily.    [provider]  ?Calcium 600-400 MG-UNIT CHEW Chew 2 each by mouth daily.    [provider]  ?hydrochlorothiazide (HYDRODIURIL) 25 MG tablet Take 25 mg by mouth daily. 12/10/17   [provider]  ?levothyroxine (SYNTHROID) 112 MCG tablet Take 224 mcg by mouth daily before breakfast.    [provider]  ?lisinopril (PRINIVIL,ZESTRIL) 40 MG tablet Take 20 mg by mouth daily. 03/20/17   [provider]  ?Multiple Vitamins-Minerals (BARIATRIC MULTIVITAMINS/IRON PO) Take 2 capsules by mouth daily.    [provider]  ?omeprazole (PRILOSEC) 20 MG capsule Take 40 mg by mouth daily. 06/28/17   [provider]  ?ondansetron (ZOFRAN) 4 MG tablet Take 1 tablet (4 mg total) by mouth every 8 (eight) hours as needed for nausea or vomiting. 09/04/20   Muthersbaugh, Jarrett Soho, PA-C  ?oxyCODONE (ROXICODONE) 5 MG immediate release tablet Take 1 tablet (5 mg total) by mouth every 6 (six) hours as needed for severe pain. 09/04/20   Muthersbaugh, Jarrett Soho, PA-C  ?promethazine (PHENERGAN) 25 MG suppository Place 1 suppository (25 mg total) rectally every 6 (six) hours as needed for nausea or vomiting.  09/04/20   Muthersbaugh, Jarrett Soho, PA-C  ?promethazine (PHENERGAN) 25 MG tablet Take 1 tablet (25 mg total) by mouth every 6 (six) hours as needed for nausea or vomiting. 10/11/20   Arnaldo Natal, MD  ?vitamin B-12 (CYANOCOBALAMIN) 100 MCG tablet Take 200 mcg by mouth daily.    [provider]  ?                                                                                                                                  ?Past Surgical History ?Past Surgical History:  ?Procedure  Laterality Date  ? ABDOMINAL HYSTERECTOMY    ? CESAREAN SECTION    ? CHOLECYSTECTOMY    ? PARTIAL NEPHRECTOMY    ? s.a.d.i.e.    ? TONSILLECTOMY    ? TUBAL LIGATION Right 2010  ? ?Family History ?Family History  ?Problem Relation Age of Onset  ? Hypertension Mother   ? Diabetes Mother   ? Hypertension Father   ? Cancer Father   ? ? ?Social History ?Social History  ? ?Tobacco Use  ? Smoking status: Never  ? Smokeless tobacco: Never  ?Vaping Use  ? Vaping Use: Never used  ?Substance Use Topics  ? Alcohol use: No  ? Drug use: No  ? ?Allergies ?Covid-19 mrna vac 5-11y pfizer [covid-19 mrna vacc (moderna)], Ketorolac tromethamine, Aminocaproic acid, and Demerol [meperidine hcl] ? ?Review of Systems ?Review of Systems  ?Genitourinary:  Positive for dysuria, flank pain and urgency.  ? ?Physical Exam ?Vital Signs  ?I have reviewed the triage vital signs ?BP 108/62   Pulse 93   Temp 98.1 ?F (36.7 ?C) (Oral)   Resp 18   SpO2 97%  ? ?Physical Exam ?Vitals and nursing note reviewed.  ?Constitutional:   ?   General: She is not in acute distress. ?   Appearance: She is well-developed.  ?HENT:  ?   Head: Normocephalic and atraumatic.  ?Eyes:  ?   Conjunctiva/sclera: Conjunctivae normal.  ?Cardiovascular:  ?   Rate and Rhythm: Normal rate and regular rhythm.  ?   Heart sounds: No murmur heard. ?Pulmonary:  ?   Effort: Pulmonary effort is normal. No respiratory distress.  ?   Breath sounds: Normal breath sounds.  ?Abdominal:  ?   Palpations: Abdomen is soft.  ?   Tenderness: There is abdominal tenderness. There is right CVA tenderness.  ?Musculoskeletal:     ?   General: No swelling.  ?   Cervical back: Neck supple.  ?Skin: ?   General: Skin is warm and dry.  ?   Capillary Refill: Capillary refill takes less than 2 seconds.  ?Neurological:  ?   Mental Status: She is alert.  ?Psychiatric:     ?   Mood and Affect: Mood normal.  ? ? ?ED Results and Treatments ?Labs ?(all labs ordered are listed, but only abnormal results are  displayed) ?Labs Reviewed  ?URINALYSIS, ROUTINE W REFLEX  MICROSCOPIC - Abnormal; Notable for the following components:  ?    Result Value  ? Color, Urine ORANGE (*)   ? Glucose, UA   (*)   ? Value: TEST NOT REPORTED DUE TO COLOR INTERFERENCE OF URINE PIGMENT  ? Hgb urine dipstick   (*)   ? Value: TEST NOT REPORTED DUE TO COLOR INTERFERENCE OF URINE PIGMENT  ? Bilirubin Urine   (*)   ? Value: TEST NOT REPORTED DUE TO COLOR INTERFERENCE OF URINE PIGMENT  ? Ketones, ur   (*)   ? Value: TEST NOT REPORTED DUE TO COLOR INTERFERENCE OF URINE PIGMENT  ? Protein, ur   (*)   ? Value: TEST NOT REPORTED DUE TO COLOR INTERFERENCE OF URINE PIGMENT  ? Nitrite   (*)   ? Value: TEST NOT REPORTED DUE TO COLOR INTERFERENCE OF URINE PIGMENT  ? Leukocytes,Ua   (*)   ? Value: TEST NOT REPORTED DUE TO COLOR INTERFERENCE OF URINE PIGMENT  ? All other components within normal limits  ?COMPREHENSIVE METABOLIC PANEL - Abnormal; Notable for the following components:  ? Potassium 3.1 (*)   ? All other components within normal limits  ?URINALYSIS, MICROSCOPIC (REFLEX) - Abnormal; Notable for the following components:  ? Bacteria, UA RARE (*)   ? All other components within normal limits  ?URINE CULTURE  ?CBC WITH DIFFERENTIAL/PLATELET  ?                                                                                                                       ? ?Radiology ?CT Renal Stone Study ? ?Result Date: 08/29/2021 ?CLINICAL DATA:  Flank pain, kidney stones suspected EXAM: CT ABDOMEN AND PELVIS WITHOUT CONTRAST TECHNIQUE: Multidetector CT imaging of the abdomen and pelvis was performed following the standard protocol without IV contrast. RADIATION DOSE REDUCTION: This exam was performed according to the departmental dose-optimization program which includes automated exposure control, adjustment of the mA and/or kV according to patient size and/or use of iterative reconstruction technique. COMPARISON:  10/11/2020 FINDINGS: Lower chest: No acute  abnormality. Hepatobiliary: No focal liver abnormality is seen. Status post cholecystectomy. No biliary dilatation. Pancreas: Unremarkable. No pancreatic ductal dilatation or surrounding inflammatory changes. Spleen: No

## 2021-08-29 NOTE — ED Notes (Signed)
Patient's left arm broke out rash and began itching shortly after receiving IV Zofran. MD made aware.  ?

## 2021-08-29 NOTE — ED Triage Notes (Signed)
Pt presents with c/o right side flank pain. Pt reports that she did pass a small stone over the weekend but is now having nausea and right side flank pain and a need to constantly urinate. ?

## 2021-08-30 ENCOUNTER — Inpatient Hospital Stay (HOSPITAL_COMMUNITY)
Admission: EM | Admit: 2021-08-30 | Discharge: 2021-09-01 | DRG: 690 | Disposition: A | Discharge status: Home / Self Care | Payer: BLUE CROSS/BLUE SHIELD | Attending: Family Medicine | Admitting: Family Medicine

## 2021-08-30 ENCOUNTER — Encounter (HOSPITAL_COMMUNITY): Payer: Self-pay | Admitting: Emergency Medicine

## 2021-08-30 ENCOUNTER — Emergency Department (HOSPITAL_COMMUNITY): Payer: BLUE CROSS/BLUE SHIELD

## 2021-08-30 DIAGNOSIS — Z79899 Other long term (current) drug therapy: Secondary | ICD-10-CM | POA: Diagnosis not present

## 2021-08-30 DIAGNOSIS — E876 Hypokalemia: Secondary | ICD-10-CM | POA: Diagnosis present

## 2021-08-30 DIAGNOSIS — N200 Calculus of kidney: Secondary | ICD-10-CM | POA: Diagnosis present

## 2021-08-30 DIAGNOSIS — I10 Essential (primary) hypertension: Secondary | ICD-10-CM | POA: Diagnosis present

## 2021-08-30 DIAGNOSIS — Z905 Acquired absence of kidney: Secondary | ICD-10-CM | POA: Diagnosis not present

## 2021-08-30 DIAGNOSIS — Z885 Allergy status to narcotic agent status: Secondary | ICD-10-CM | POA: Diagnosis not present

## 2021-08-30 DIAGNOSIS — Z79890 Hormone replacement therapy (postmenopausal): Secondary | ICD-10-CM

## 2021-08-30 DIAGNOSIS — K219 Gastro-esophageal reflux disease without esophagitis: Secondary | ICD-10-CM | POA: Diagnosis present

## 2021-08-30 DIAGNOSIS — N390 Urinary tract infection, site not specified: Secondary | ICD-10-CM | POA: Diagnosis present

## 2021-08-30 DIAGNOSIS — Z887 Allergy status to serum and vaccine status: Secondary | ICD-10-CM | POA: Diagnosis not present

## 2021-08-30 DIAGNOSIS — R112 Nausea with vomiting, unspecified: Secondary | ICD-10-CM | POA: Diagnosis present

## 2021-08-30 DIAGNOSIS — E039 Hypothyroidism, unspecified: Secondary | ICD-10-CM | POA: Diagnosis present

## 2021-08-30 DIAGNOSIS — N3000 Acute cystitis without hematuria: Secondary | ICD-10-CM | POA: Diagnosis present

## 2021-08-30 DIAGNOSIS — Z833 Family history of diabetes mellitus: Secondary | ICD-10-CM | POA: Diagnosis not present

## 2021-08-30 DIAGNOSIS — Z9851 Tubal ligation status: Secondary | ICD-10-CM | POA: Diagnosis not present

## 2021-08-30 DIAGNOSIS — Z8249 Family history of ischemic heart disease and other diseases of the circulatory system: Secondary | ICD-10-CM | POA: Diagnosis not present

## 2021-08-30 DIAGNOSIS — Z888 Allergy status to other drugs, medicaments and biological substances status: Secondary | ICD-10-CM

## 2021-08-30 DIAGNOSIS — R7401 Elevation of levels of liver transaminase levels: Secondary | ICD-10-CM | POA: Diagnosis present

## 2021-08-30 DIAGNOSIS — Z85850 Personal history of malignant neoplasm of thyroid: Secondary | ICD-10-CM

## 2021-08-30 DIAGNOSIS — N12 Tubulo-interstitial nephritis, not specified as acute or chronic: Principal | ICD-10-CM

## 2021-08-30 LAB — COMPREHENSIVE METABOLIC PANEL
ALT: 48 U/L — ABNORMAL HIGH (ref 0–44)
AST: 37 U/L (ref 15–41)
Albumin: 4.4 g/dL (ref 3.5–5.0)
Alkaline Phosphatase: 102 U/L (ref 38–126)
Anion gap: 8 (ref 5–15)
BUN: 14 mg/dL (ref 6–20)
CO2: 29 mmol/L (ref 22–32)
Calcium: 9.3 mg/dL (ref 8.9–10.3)
Chloride: 101 mmol/L (ref 98–111)
Creatinine, Ser: 0.62 mg/dL (ref 0.44–1.00)
GFR, Estimated: >60 mL/min (ref >60)
Glucose, Bld: 90 mg/dL (ref 70–99)
Potassium: 2.9 mmol/L — ABNORMAL LOW (ref 3.5–5.1)
Sodium: 138 mmol/L (ref 135–145)
Total Bilirubin: 0.7 mg/dL (ref 0.3–1.2)
Total Protein: 7.5 g/dL (ref 6.5–8.1)

## 2021-08-30 LAB — CBC
HCT: 45.5 % (ref 36.0–46.0)
Hemoglobin: 15.4 g/dL — ABNORMAL HIGH (ref 12.0–15.0)
MCH: 29.7 pg (ref 26.0–34.0)
MCHC: 33.8 g/dL (ref 30.0–36.0)
MCV: 87.7 fL (ref 80.0–100.0)
Platelets: 287 10*3/uL (ref 150–400)
RBC: 5.19 MIL/uL — ABNORMAL HIGH (ref 3.87–5.11)
RDW: 12.7 % (ref 11.5–15.5)
WBC: 8.2 10*3/uL (ref 4.0–10.5)
nRBC: 0 % (ref 0.0–0.2)

## 2021-08-30 LAB — URINALYSIS, ROUTINE W REFLEX MICROSCOPIC
Bacteria, UA: NONE SEEN
Bilirubin Urine: NEGATIVE
Glucose, UA: NEGATIVE mg/dL
Ketones, ur: NEGATIVE mg/dL
Leukocytes,Ua: NEGATIVE
Nitrite: POSITIVE — AB
Protein, ur: NEGATIVE mg/dL
RBC / HPF: >50 RBC/hpf — ABNORMAL HIGH (ref 0–5)
Specific Gravity, Urine: 1.019 (ref 1.005–1.030)
pH: 6 (ref 5.0–8.0)

## 2021-08-30 LAB — URINE CULTURE: Culture: <10000 — AB

## 2021-08-30 LAB — LIPASE, BLOOD: Lipase: 36 U/L (ref 11–51)

## 2021-08-30 LAB — MAGNESIUM: Magnesium: 2.2 mg/dL (ref 1.7–2.4)

## 2021-08-30 LAB — I-STAT BETA HCG BLOOD, ED (MC, WL, AP ONLY): I-stat hCG, quantitative: <5 m[IU]/mL (ref <5)

## 2021-08-30 MED ORDER — ACETAMINOPHEN 325 MG PO TABS: 650.0000 mg | ORAL_TABLET | Freq: Four times a day (QID) | ORAL | Status: DC | PRN

## 2021-08-30 MED ORDER — POTASSIUM CHLORIDE CRYS ER 20 MEQ PO TBCR
40.0000 meq | EXTENDED_RELEASE_TABLET | Freq: Once | ORAL | Status: AC
  Administered 2021-08-30: 40 meq via ORAL
  Filled 2021-08-30: qty 2

## 2021-08-30 MED ORDER — DIPHENHYDRAMINE HCL 50 MG/ML IJ SOLN
12.5000 mg | Freq: Once | INTRAMUSCULAR | Status: AC
  Administered 2021-08-30: 12.5 mg via INTRAVENOUS
  Filled 2021-08-30: qty 1

## 2021-08-30 MED ORDER — HYDROMORPHONE HCL 1 MG/ML IJ SOLN
0.5000 mg | INTRAMUSCULAR | Status: DC | PRN
  Administered 2021-08-31 – 2021-09-01 (×8): 0.5 mg via INTRAVENOUS
  Filled 2021-08-30: qty 0.5
  Filled 2021-08-30: qty 1
  Filled 2021-08-30 (×2): qty 0.5
  Filled 2021-08-30: qty 1
  Filled 2021-08-30: qty 0.5
  Filled 2021-08-30: qty 1
  Filled 2021-08-30: qty 0.5

## 2021-08-30 MED ORDER — HYDROMORPHONE HCL 1 MG/ML IJ SOLN
1.0000 mg | Freq: Once | INTRAMUSCULAR | Status: AC
  Administered 2021-08-30: 1 mg via INTRAVENOUS
  Filled 2021-08-30: qty 1

## 2021-08-30 MED ORDER — CEFTRIAXONE SODIUM 2 G IJ SOLR
2.0000 g | Freq: Once | INTRAMUSCULAR | Status: AC
  Administered 2021-08-30: 2 g via INTRAVENOUS
  Filled 2021-08-30: qty 20

## 2021-08-30 MED ORDER — ACETAMINOPHEN 650 MG RE SUPP: 650.0000 mg | Freq: Four times a day (QID) | RECTAL | Status: DC | PRN

## 2021-08-30 MED ORDER — POTASSIUM CHLORIDE 10 MEQ/100ML IV SOLN
10.0000 meq | INTRAVENOUS | Status: AC
  Administered 2021-08-30 – 2021-08-31 (×4): 10 meq via INTRAVENOUS
  Filled 2021-08-30 (×4): qty 100

## 2021-08-30 MED ORDER — SODIUM CHLORIDE 0.9 % IV SOLN
1.0000 g | INTRAVENOUS | Status: DC
  Administered 2021-08-31: 1 g via INTRAVENOUS
  Filled 2021-08-30 (×2): qty 10

## 2021-08-30 MED ORDER — LACTATED RINGERS IV BOLUS
1000.0000 mL | Freq: Once | INTRAVENOUS | Status: AC
  Administered 2021-08-30: 1000 mL via INTRAVENOUS

## 2021-08-30 MED ORDER — NALOXONE HCL 0.4 MG/ML IJ SOLN
0.4000 mg | INTRAMUSCULAR | Status: DC | PRN
Start: 2021-08-30 — End: 2021-09-01

## 2021-08-30 MED ORDER — ONDANSETRON HCL 4 MG/2ML IJ SOLN
4.0000 mg | Freq: Four times a day (QID) | INTRAMUSCULAR | Status: DC | PRN
  Administered 2021-08-31 – 2021-09-01 (×6): 4 mg via INTRAVENOUS
  Filled 2021-08-30 (×6): qty 2

## 2021-08-30 MED ORDER — METOCLOPRAMIDE HCL 5 MG/ML IJ SOLN
10.0000 mg | Freq: Once | INTRAMUSCULAR | Status: AC
  Administered 2021-08-30: 10 mg via INTRAVENOUS
  Filled 2021-08-30: qty 2

## 2021-08-30 NOTE — ED Triage Notes (Signed)
Per pt, states she passed a kidney stone yesterday and was diagnosed with UTI-seen in ED yesterday and was given antibiotic-states she has been unable to keep pill down-was told to come to ED ?

## 2021-08-30 NOTE — ED Provider Notes (Signed)
?[facility] DEPT ?Provider Note ? ? ?CSN: [id number] ?Arrival date & time: 08/30/21  1214 ? ?  ? ?History ? ?Chief Complaint  ?Patient presents with  ? Emesis  ? ? ?Yolanda [facility] is a 51 y.o. female. ? ? ?Emesis ?Associated symptoms: abdominal pain   ? ?51 year old female with recently passed kidney stone, diagnosis of UTI and pyelonephritis, attempted to be treated outpatient after being seen in the emergency department yesterday who presents to the emergency department with nausea, vomiting, abdominal pain, inability to tolerate her oral antibiotics at home. ? ?Patient continues to endorse suprapubic pain with some flank pain, worse on the right.  She has had nausea and vomiting after taking her Duricef medicine this morning.  She was unable to keep anything down.  She endorses chills.  She had a CT renal stone study that was performed yesterday in the ED which showed a small nonobstructive calculus in the inferior pole of the right kidney with no ureteric calculi or hydronephrosis.  She is also status post sleeve gastrectomy, cholecystectomy and hysterectomy.  She denies any vaginal bleeding or vaginal discharge.  She states her pain is 5 out of 10 in severity at this time.  She endorses continued dysuria. ? ?Home Medications ?Prior to Admission medications   ?Medication Sig Start Date End Date Taking? Authorizing Provider  ?acetaminophen (TYLENOL) 500 MG tablet Take 1,000 mg by mouth every 6 (six) hours as needed for mild pain.    [provider]  ?ACIDOPHILUS LACTOBACILLUS PO Take 1 tablet by mouth daily.    [provider]  ?BIOTIN PO Take 1 tablet by mouth daily.    [provider]  ?Calcium 600-400 MG-UNIT CHEW Chew 2 each by mouth daily.    [provider]  ?cefadroxil (DURICEF) 500 MG capsule Take 1 capsule (500 mg total) by mouth 2 (two) times daily for 7 days. 08/29/21 09/05/21  Kommor, Madison, MD  ?hydrochlorothiazide (HYDRODIURIL) 25 MG  tablet Take 25 mg by mouth daily. 12/10/17   [provider]  ?levothyroxine (SYNTHROID) 112 MCG tablet Take 224 mcg by mouth daily before breakfast.    [provider]  ?lisinopril (PRINIVIL,ZESTRIL) 40 MG tablet Take 20 mg by mouth daily. 03/20/17   [provider]  ?Multiple Vitamins-Minerals (BARIATRIC MULTIVITAMINS/IRON PO) Take 2 capsules by mouth daily.    [provider]  ?omeprazole (PRILOSEC) 20 MG capsule Take 40 mg by mouth daily. 06/28/17   [provider]  ?ondansetron (ZOFRAN) 4 MG tablet Take 1 tablet (4 mg total) by mouth every 8 (eight) hours as needed for nausea or vomiting. 09/04/20   Muthersbaugh, Jarrett Soho, PA-C  ?oxyCODONE (ROXICODONE) 5 MG immediate release tablet Take 1 tablet (5 mg total) by mouth every 6 (six) hours as needed for severe pain. 09/04/20   Muthersbaugh, Jarrett Soho, PA-C  ?oxyCODONE-acetaminophen (PERCOCET/ROXICET) 5-325 MG tablet Take 1 tablet by mouth every 6 (six) hours as needed for severe pain. 08/29/21   Kommor, Madison, MD  ?promethazine (PHENERGAN) 25 MG tablet Take 1 tablet (25 mg total) by mouth every 6 (six) hours as needed for nausea or vomiting. 08/29/21   Kommor, Madison, MD  ?vitamin B-12 (CYANOCOBALAMIN) 100 MCG tablet Take 200 mcg by mouth daily.    [provider]  ?   ? ?Allergies    ?Covid-19 mrna vac 5-11y pfizer [covid-19 mrna vacc (moderna)], Ketorolac tromethamine, Aminocaproic acid, and Demerol [meperidine hcl]   ? ?Review of Systems   ?Review of Systems  ?Gastrointestinal:  Positive for abdominal pain, nausea and vomiting.  ?Genitourinary:  Positive for dysuria and flank pain.  ?All other systems reviewed and are negative. ? ?Physical Exam ?Updated Vital Signs ?BP 131/82 (BP Location: Right Arm)   Pulse 78   Temp 98.6 ?F (37 ?C) (Oral)   Resp 18   SpO2 97%  ?Physical Exam ?Vitals and nursing note reviewed.  ?Constitutional:   ?   General: She is not in acute distress. ?   Appearance: She is well-developed.   ?HENT:  ?   Head: Normocephalic and atraumatic.  ?Eyes:  ?   Conjunctiva/sclera: Conjunctivae normal.  ?Cardiovascular:  ?   Rate and Rhythm: Normal rate and regular rhythm.  ?Pulmonary:  ?   Effort: Pulmonary effort is normal. No respiratory distress.  ?   Breath sounds: Normal breath sounds.  ?Abdominal:  ?   Palpations: Abdomen is soft.  ?   Tenderness: There is abdominal tenderness in the suprapubic area. There is right CVA tenderness and guarding. There is no left CVA tenderness.  ?Musculoskeletal:     ?   General: No swelling.  ?   Cervical back: Neck supple.  ?Skin: ?   General: Skin is warm and dry.  ?   Capillary Refill: Capillary refill takes less than 2 seconds.  ?Neurological:  ?   Mental Status: She is alert.  ?Psychiatric:     ?   Mood and Affect: Mood normal.  ? ? ?ED Results / Procedures / Treatments   ?Labs ?(all labs ordered are listed, but only abnormal results are displayed) ?Labs Reviewed  ?COMPREHENSIVE METABOLIC PANEL - Abnormal; Notable for the following components:  ?    Result Value  ? Potassium 2.9 (*)   ? ALT 48 (*)   ? All other components within normal limits  ?CBC - Abnormal; Notable for the following components:  ? RBC 5.19 (*)   ? Hemoglobin 15.4 (*)   ? All other components within normal limits  ?URINALYSIS, ROUTINE W REFLEX MICROSCOPIC - Abnormal; Notable for the following components:  ? Color, Urine AMBER (*)   ? Hgb urine dipstick MODERATE (*)   ? Nitrite POSITIVE (*)   ? RBC / HPF >50 (*)   ? All other components within normal limits  ?LIPASE, BLOOD  ?MAGNESIUM  ?MAGNESIUM  ?COMPREHENSIVE METABOLIC PANEL  ?CBC WITH DIFFERENTIAL/PLATELET  ?I-STAT BETA HCG BLOOD, ED ([facility], [facility], [facility] ONLY)  ? ? ?EKG ?None ? ?Radiology ?US Renal ? ?Result Date: 08/30/2021 ?CLINICAL DATA:  Flank pain EXAM: RENAL / URINARY TRACT ULTRASOUND COMPLETE COMPARISON:  CT 08/29/2021 FINDINGS: Right Kidney: Renal measurements: 10.2 x 4.4 x 5.2 cm = volume: 124.1 mL. Echogenicity within normal limits. No mass or  hydronephrosis visualized. Left Kidney: Renal measurements: 12.6 x 4.7 x 5.1 cm = volume: 156.3 mL. Echogenicity within normal limits. No mass or hydronephrosis visualized. Punctate echogenic focus at the lower pole measuring 2 mm likely corresponds to the CT demonstrated kidney stone Bladder: Appears normal for degree of bladder distention. Other: None. IMPRESSION: 1. Negative for hydronephrosis. 2. Punctate left kidney stone Electronically Signed   By: Donavan Foil M.D.   On: 08/30/2021 22:50  ? ?CT Renal Stone Study ? ?Result Date: 08/29/2021 ?CLINICAL DATA:  Flank pain, kidney stones suspected EXAM: CT ABDOMEN AND PELVIS WITHOUT CONTRAST TECHNIQUE: Multidetector CT imaging of the abdomen and pelvis was performed following the standard protocol without IV contrast. RADIATION DOSE REDUCTION: This exam was performed according to the departmental dose-optimization program which  includes automated exposure control, adjustment of the mA and/or kV according to patient size and/or use of iterative reconstruction technique. COMPARISON:  10/11/2020 FINDINGS: Lower chest: No acute abnormality. Hepatobiliary: No focal liver abnormality is seen. Status post cholecystectomy. No biliary dilatation. Pancreas: Unremarkable. No pancreatic ductal dilatation or surrounding inflammatory changes. Spleen: Normal in size without significant abnormality. Adrenals/Urinary Tract: Adrenal glands are unremarkable. Small nonobstructive calculus of the inferior pole of the right kidney (series 5, image 93). No ureteral calculi or hydronephrosis. Bladder is unremarkable. Stomach/Bowel: Status post partial sleeve gastrectomy. Appendix appears normal. No evidence of bowel wall thickening, distention, or inflammatory changes. Vascular/Lymphatic: Aortic atherosclerosis. No enlarged abdominal or pelvic lymph nodes. Reproductive: Status post hysterectomy. Other: No abdominal wall hernia or abnormality. No ascites. Musculoskeletal: No acute or  significant osseous findings. IMPRESSION: 1. Small nonobstructive calculus of the inferior pole of the right kidney. No ureteral calculi or hydronephrosis. 2. Status post partial sleeve gastrectomy, cholecystectomy, an

## 2021-08-31 ENCOUNTER — Other Ambulatory Visit: Payer: Self-pay

## 2021-08-31 ENCOUNTER — Encounter (HOSPITAL_COMMUNITY): Payer: Self-pay | Admitting: Internal Medicine

## 2021-08-31 ENCOUNTER — Inpatient Hospital Stay (HOSPITAL_COMMUNITY): Payer: BLUE CROSS/BLUE SHIELD

## 2021-08-31 DIAGNOSIS — K219 Gastro-esophageal reflux disease without esophagitis: Secondary | ICD-10-CM | POA: Diagnosis present

## 2021-08-31 DIAGNOSIS — E039 Hypothyroidism, unspecified: Secondary | ICD-10-CM | POA: Diagnosis not present

## 2021-08-31 DIAGNOSIS — N200 Calculus of kidney: Secondary | ICD-10-CM

## 2021-08-31 DIAGNOSIS — R112 Nausea with vomiting, unspecified: Secondary | ICD-10-CM | POA: Diagnosis present

## 2021-08-31 DIAGNOSIS — N3000 Acute cystitis without hematuria: Secondary | ICD-10-CM | POA: Diagnosis not present

## 2021-08-31 DIAGNOSIS — E876 Hypokalemia: Secondary | ICD-10-CM | POA: Diagnosis present

## 2021-08-31 LAB — CBC WITH DIFFERENTIAL/PLATELET
Abs Immature Granulocytes: 0.01 10*3/uL (ref 0.00–0.07)
Basophils Absolute: 0 10*3/uL (ref 0.0–0.1)
Basophils Relative: 0 %
Eosinophils Absolute: 0.1 10*3/uL (ref 0.0–0.5)
Eosinophils Relative: 2 %
HCT: 36.6 % (ref 36.0–46.0)
Hemoglobin: 12.1 g/dL (ref 12.0–15.0)
Immature Granulocytes: 0 %
Lymphocytes Relative: 40 %
Lymphs Abs: 2.3 10*3/uL (ref 0.7–4.0)
MCH: 29.8 pg (ref 26.0–34.0)
MCHC: 33.1 g/dL (ref 30.0–36.0)
MCV: 90.1 fL (ref 80.0–100.0)
Monocytes Absolute: 0.4 10*3/uL (ref 0.1–1.0)
Monocytes Relative: 7 %
Neutro Abs: 3 10*3/uL (ref 1.7–7.7)
Neutrophils Relative %: 51 %
Platelets: 231 10*3/uL (ref 150–400)
RBC: 4.06 MIL/uL (ref 3.87–5.11)
RDW: 12.7 % (ref 11.5–15.5)
WBC: 5.9 10*3/uL (ref 4.0–10.5)
nRBC: 0 % (ref 0.0–0.2)

## 2021-08-31 LAB — COMPREHENSIVE METABOLIC PANEL
ALT: 131 U/L — ABNORMAL HIGH (ref 0–44)
AST: 216 U/L — ABNORMAL HIGH (ref 15–41)
Albumin: 3.1 g/dL — ABNORMAL LOW (ref 3.5–5.0)
Alkaline Phosphatase: 85 U/L (ref 38–126)
Anion gap: 2 — ABNORMAL LOW (ref 5–15)
BUN: 12 mg/dL (ref 6–20)
CO2: 26 mmol/L (ref 22–32)
Calcium: 7.4 mg/dL — ABNORMAL LOW (ref 8.9–10.3)
Chloride: 110 mmol/L (ref 98–111)
Creatinine, Ser: 0.47 mg/dL (ref 0.44–1.00)
GFR, Estimated: >60 mL/min (ref >60)
Glucose, Bld: 88 mg/dL (ref 70–99)
Potassium: 3.7 mmol/L (ref 3.5–5.1)
Sodium: 138 mmol/L (ref 135–145)
Total Bilirubin: 0.2 mg/dL — ABNORMAL LOW (ref 0.3–1.2)
Total Protein: 5.3 g/dL — ABNORMAL LOW (ref 6.5–8.1)

## 2021-08-31 LAB — MAGNESIUM: Magnesium: 1.7 mg/dL (ref 1.7–2.4)

## 2021-08-31 MED ORDER — PHENAZOPYRIDINE HCL 100 MG PO TABS
100.0000 mg | ORAL_TABLET | Freq: Three times a day (TID) | ORAL | Status: AC
  Administered 2021-08-31 – 2021-09-01 (×3): 100 mg via ORAL
  Filled 2021-08-31 (×4): qty 1

## 2021-08-31 MED ORDER — SODIUM CHLORIDE 0.9 % IV SOLN
INTRAVENOUS | Status: DC
Start: 2021-08-31 — End: 2021-09-01

## 2021-08-31 MED ORDER — PROCHLORPERAZINE EDISYLATE 10 MG/2ML IJ SOLN
5.0000 mg | Freq: Once | INTRAMUSCULAR | Status: AC | PRN
  Administered 2021-08-31: 5 mg via INTRAVENOUS
  Filled 2021-08-31: qty 2

## 2021-08-31 MED ORDER — PANTOPRAZOLE SODIUM 40 MG IV SOLR
40.0000 mg | INTRAVENOUS | Status: DC
Start: 2021-08-31 — End: 2021-09-01
  Administered 2021-08-31 – 2021-09-01 (×2): 40 mg via INTRAVENOUS
  Filled 2021-08-31 (×2): qty 10

## 2021-08-31 MED ORDER — LEVOTHYROXINE SODIUM 112 MCG PO TABS
112.0000 ug | ORAL_TABLET | Freq: Every day | ORAL | Status: DC
  Administered 2021-09-01: 112 ug via ORAL
  Filled 2021-08-31: qty 1

## 2021-08-31 MED ORDER — LEVOTHYROXINE SODIUM 112 MCG PO TABS
224.0000 ug | ORAL_TABLET | Freq: Every day | ORAL | Status: DC
  Administered 2021-08-31: 112 ug via ORAL
  Filled 2021-08-31: qty 2

## 2021-08-31 NOTE — Assessment & Plan Note (Signed)
? ?#)   Essential Hypertension: documented h/o such, with outpatient antihypertensive regimen including HCTZ, lisinopril.  SBP's in the ED today: Normotensive.  ? ?Plan: Close monitoring of subsequent BP via routine VS. in the setting of presenting acute infectious process, will hold home antihypertensive medications for now. ? ? ?

## 2021-08-31 NOTE — Progress Notes (Signed)
[facility] ? ?PROGRESS NOTE ? ?Kaidan Spengler UXL:[id number] DOB: 25-Sep-1970 DOA: 08/30/2021 ?PCP: Lorenza Chick, MD ? ? ?Brief HPI:   ?51 year old female with a history of hypertension, acquired hypothyroidism, GERD who was admitted on 411 with acute cystitis and inability to tolerate p.o. also had nausea vomiting.  Patient said that she passed kidney stone few days ago came to this long with dysuria, she was diagnosed with acute cystitis and was started on Duricef and discharged home from the ED.  Patient developed nausea and vomiting after she took antibiotic.  She was not able to tolerate anything p.o. so she came back to the hospital. ?Urine cultures obtained and patient started on IV Rocephin. ? ? ?Subjective  ? ?Patient seen and examined, denies nausea vomiting at this time. ? ? Assessment/Plan:  ? ? ? ?Acute cystitis ?-Patient was diagnosed with acute cystitis recently and start started on antibiotics ?-Currently on IV ceftriaxone ?-Urine culture collected on 08/29/2021 only showed multiple species ?-We will continue with IV Rocephin for 3 days; as patient is unable to take antibiotics p.o. due to intractable nausea and vomiting ? ?Intractable nausea and vomiting ?-Resolved ?-Continue Zofran as needed ? ?Transaminitis ?-Patient had significant elevation of AST and ALT today ?-AST 216, ALT 131; unclear etiology ?-Abdominal ultrasound obtained today shows suspected mild intra and extrahepatic biliary ductal dilatation, presumably secondary to postcholecystectomy state and biliary reservoir phenomenon ?-Follow LFTs in a.m. if still elevated consider GI consultation ? ?Hypothyroidism ?-Continue Synthroid ? ?Hypertension ?-Antihypertensive medications are currently on hold ?-Blood pressure is stable ? ? ? ? ? ? ? ?Medications ? ?  ? [START ON 09/01/2021] levothyroxine  112 mcg Oral QAC breakfast  ? pantoprazole (PROTONIX) IV  40 mg Intravenous Q24H  ? phenazopyridine  100 mg Oral TID WC  ? ? ? Data  Reviewed:  ? ?CBG: ? ?No results for input(s): GLUCAP in the last 168 hours. ? ?SpO2: 100 %  ? ? ?Vitals:  ? 08/31/21 1205 08/31/21 1244 08/31/21 1505 08/31/21 1644  ?BP: 112/75  (!) 149/132 112/78  ?Pulse: 66  72 61  ?Resp: 12  18   ?Temp:  98.1 ?F (36.7 ?C) 98.2 ?F (36.8 ?C)   ?TempSrc:  Oral Oral   ?SpO2: 95%  100%   ?Weight:   82.5 kg   ?Height:   '5\' 10"'$  (1.778 m)   ? ? ? ? ?Data Reviewed: ? ?Basic Metabolic Panel: ?Recent Labs  ?Lab 08/29/21 ?1417 08/30/21 ?1932 08/30/21 ?2203 08/31/21 ?0340  ?NA 138 138  --  138  ?K 3.1* 2.9*  --  3.7  ?CL 102 101  --  110  ?CO2 28 29  --  26  ?GLUCOSE 94 90  --  88  ?BUN 20 14  --  12  ?CREATININE 0.76 0.62  --  0.47  ?CALCIUM 9.0 9.3  --  7.4*  ?MG  --   --  2.2 1.7  ? ? ?CBC: ?Recent Labs  ?Lab 08/29/21 ?1417 08/30/21 ?1932 08/31/21 ?0340  ?WBC 7.8 8.2 5.9  ?NEUTROABS 5.3  --  3.0  ?HGB 14.6 15.4* 12.1  ?HCT 42.7 45.5 36.6  ?MCV 87.0 87.7 90.1  ?PLT 321 287 231  ? ? ?LFT ?Recent Labs  ?Lab 08/29/21 ?1417 08/30/21 ?1932 08/31/21 ?0340  ?AST 33 37 216*  ?ALT 39 48* 131*  ?ALKPHOS 99 102 85  ?BILITOT 0.7 0.7 0.2*  ?PROT 7.6 7.5 5.3*  ?ALBUMIN 4.5 4.4 3.1*  ? ?  ?Antibiotics: ?Anti-infectives (  From admission, onward)  ? ? Start     Dose/Rate Route Frequency Ordered Stop  ? 08/31/21 2300  cefTRIAXone (ROCEPHIN) 1 g in sodium chloride 0.9 % 100 mL IVPB       ? 1 g ?200 mL/hr over 30 Minutes Intravenous Every 24 hours 08/30/21 2319    ? 08/30/21 2315  cefTRIAXone (ROCEPHIN) 2 g in sodium chloride 0.9 % 100 mL IVPB       ? 2 g ?200 mL/hr over 30 Minutes Intravenous  Once 08/30/21 2312 08/30/21 2329  ? ?  ? ? ? ?DVT prophylaxis: SCDs ? ?Code Status: Full code ? ?Family Communication: No family at bedside ? ? ?CONSULTS  ? ? ?Objective  ? ? ?Physical Examination: ? ? ?General-appears in no acute distress ?Heart-S1-S2, regular, no murmur auscultated ?Lungs-clear to auscultation bilaterally, no wheezing or crackles auscultated ?Abdomen-soft, nontender, no organomegaly ?Extremities-no  edema in the lower extremities ?Neuro-alert, oriented x3, no focal deficit noted ? ? ?Status is: Inpatient: Intractable nausea and vomiting ? ? ? ?  ? ? ? ? ? ?Oswald Hillock ?  ?[facility] ?If 7PM-7AM, please contact night-coverage at [email or web address], ?Office  [phone] ? ? ?08/31/2021, 7:42 PM  LOS: 1 day  ? ? ? ? ? ? ? ? ? ? ?  ?

## 2021-08-31 NOTE — Progress Notes (Signed)
? ? ?  OVERNIGHT PROGRESS REPORT ? ?Notified by RN for limited intake due to N/V. ?Added maintenance IV fluid for the immediate time until PO intake is tolerable/reliable. ? ? ? ? ? ?Gershon Cull MSNA MSN ACNPC-AG ?Acute Care Nurse Practitioner ?[facility] ?[place] ? ? ? ?

## 2021-08-31 NOTE — Assessment & Plan Note (Signed)
? ?#)   Intractable nausea/vomiting: 3-4 episodes of nonbloody, nonbilious emesis over the last 24 hours since discharge from [facility], ED on oral antibiotics, significantly limiting her ability to tolerate p.o. as a consequence. ? ?Plan: Prn IV Zofran.  As needed IV Ativan for nausea/vomiting refractory to prn Zofran.  Add on serum magnesium level.  Further evaluation management of presenting hypokalemia, as further detailed below.  CMP and CBC in the morning. ? ? ?

## 2021-08-31 NOTE — H&P (Signed)
?History and Physical  ? ? ?PLEASE NOTE THAT [employer]AGON DICTATION SOFTWARE WAS USED IN THE CONSTRUCTION OF THIS NOTE. ? ? ?Yolanda [facility] [id number] DOB: 1971-04-25 DOA: 08/30/2021 ? ?PCP: Lorenza Chick, MD  ?Patient coming from: home  ? ?I have personally briefly reviewed patient's old medical records in [place] ? ?Chief Complaint: Nausea vomiting ? ?HPI: Yolanda [facility] is a 51 y.o. female with medical history significant for essential hypertension, acquired hypothyroidism, GERD, who is admitted to [facility] on 08/30/2021 with acute cystitis and inability to tolerate p.o. after presenting from home to [facility] ED complaining of nausea vomiting.  ? ?The patient reports that she passed a kidney stone a few days ago presenting to [facility] emergency department for 1023 with new dysuria, at which time she was diagnosed with acute cystitis she was started on [place] and discharged home from the emergency department for this uncomplicated urinary tract infection.  Will, the patient orts intermittent nausea resulting in at least 3-4 episodes of nonbloody, nonbilious emesis, and associated inability to tolerate any p.o. over that time, including inability to tolerate her newly initiated oral antibiotic, prompting her to present back to [facility] emergency department this evening for further evaluation and management thereof.  She reports interval worsening of her suprapubic tenderness, but denies any residual flank tenderness.  Denies any associated subjective fever, chills, rigors, generalized myalgias.  Not associate any chest pain, shortness breath, cough, palpitations, diaphoresis.  No headache, neck stiffness, rash, or diarrhea. ? ?Per chart review, urine culture was collected in the ED on 08/29/2021 prior to initiation of aforementioned antibiotic. ? ? ? ? ? ?ED Course:  ?Vital signs in the ED were notable for the following: Afebrile; heart rate 78-86 with low blood pressure 131/82; respiratory rate  16-18, oxygen saturation 96 to 99% on room air. ? ?Labs were notable for the following: CMP notable for the following: Potassium 2.9, creatinine 0.60, liver enzymes found to be within normal limits.  CBC notable for white blood cell count 8200, hemoglobin 15.4. ? ?Imaging and additional notable ED work-up: Renal ultrasound performed this evening showed no evidence of obstructing ureteral stone, nor any evidence of hydroureteronephrosis nor any evidence of perinephric stranding. ? ?While in the ED, the following were administered: Dilaudid 1 mg IV x1, Reglan 10 mg IV x1, potassium chloride 40 mill equivalents p.o. x1, Rocephin, lactated Ringer's x1 L bolus. ? ?Subsequently, the patient was admitted for further evaluation and management of presenting acute cystitis in the context of intractable nausea/vomiting limiting her ability to tolerate p.o., including inability to tolerate oral antibiotics at this time, with presenting labs also notable for hypokalemia. ? ? ? ?Review of Systems: As per HPI otherwise 10 point review of systems negative.  ? ?Past Medical History:  ?Diagnosis Date  ? Cancer [facility])   ? renal  ? GERD (gastroesophageal reflux disease)   ? History of kidney cancer   ? Hypertension   ? Kidney stones   ? Kidney stones   ? Polycystic ovarian syndrome   ? Renal disorder   ? Thyroid cancer ([place])   ? Thyroid disease   ? ? ?Past Surgical History:  ?Procedure Laterality Date  ? ABDOMINAL HYSTERECTOMY    ? CESAREAN SECTION    ? CHOLECYSTECTOMY    ? PARTIAL NEPHRECTOMY    ? s.a.d.i.e.    ? TONSILLECTOMY    ? TUBAL LIGATION Right 2010  ? ? ?Social History: ? reports that she has never smoked. She  has never used smokeless tobacco. She reports that she does not drink alcohol and does not use drugs. ? ? ?Allergies  ?Allergen Reactions  ? Covid-19 Mrna Vac 5-11y Pfizer [Covid-19 Mrna Vacc (Moderna)] Anaphylaxis  ?  Epi pen  ? Ketorolac Tromethamine Rash  ? Aminocaproic Acid Nausea And Vomiting  ? Demerol [Meperidine  Hcl] Hives, Itching and Palpitations  ? ? ?Family History  ?Problem Relation Age of Onset  ? Hypertension Mother   ? Diabetes Mother   ? Hypertension Father   ? Cancer Father   ? ? ?Family history reviewed and not pertinent  ? ? ?Prior to Admission medications   ?Medication Sig Start Date End Date Taking? Authorizing Provider  ?acetaminophen (TYLENOL) 500 MG tablet Take 1,000 mg by mouth every 6 (six) hours as needed for mild pain.    [provider]  ?ACIDOPHILUS LACTOBACILLUS PO Take 1 tablet by mouth daily.    [provider]  ?BIOTIN PO Take 1 tablet by mouth daily.    [provider]  ?Calcium 600-400 MG-UNIT CHEW Chew 2 each by mouth daily.    [provider]  ?cefadroxil (DURICEF) 500 MG capsule Take 1 capsule (500 mg total) by mouth 2 (two) times daily for 7 days. 08/29/21 09/05/21  Kommor, Madison, MD  ?hydrochlorothiazide (HYDRODIURIL) 25 MG tablet Take 25 mg by mouth daily. 12/10/17   [provider]  ?levothyroxine (SYNTHROID) 112 MCG tablet Take 224 mcg by mouth daily before breakfast.    [provider]  ?lisinopril (PRINIVIL,ZESTRIL) 40 MG tablet Take 20 mg by mouth daily. 03/20/17   [provider]  ?Multiple Vitamins-Minerals (BARIATRIC MULTIVITAMINS/IRON PO) Take 2 capsules by mouth daily.    [provider]  ?omeprazole (PRILOSEC) 20 MG capsule Take 40 mg by mouth daily. 06/28/17   [provider]  ?ondansetron (ZOFRAN) 4 MG tablet Take 1 tablet (4 mg total) by mouth every 8 (eight) hours as needed for nausea or vomiting. 09/04/20   Muthersbaugh, Jarrett Soho, PA-C  ?oxyCODONE (ROXICODONE) 5 MG immediate release tablet Take 1 tablet (5 mg total) by mouth every 6 (six) hours as needed for severe pain. 09/04/20   Muthersbaugh, Jarrett Soho, PA-C  ?oxyCODONE-acetaminophen (PERCOCET/ROXICET) 5-325 MG tablet Take 1 tablet by mouth every 6 (six) hours as needed for severe pain. 08/29/21   Kommor, Madison, MD  ?promethazine (PHENERGAN) 25 MG  tablet Take 1 tablet (25 mg total) by mouth every 6 (six) hours as needed for nausea or vomiting. 08/29/21   Kommor, Madison, MD  ?vitamin B-12 (CYANOCOBALAMIN) 100 MCG tablet Take 200 mcg by mouth daily.    [provider]  ? ? ? ?Objective  ? ? ?Physical Exam: ?Vitals:  ? 08/30/21 2019 08/30/21 2153 08/30/21 2330 08/31/21 0325  ?BP: 120/72 131/82  101/69  ?Pulse: 77 78  64  ?Resp: '16 18  12  '$ ?Temp: 98.6 ?F (37 ?C)   97.6 ?F (36.4 ?C)  ?TempSrc: Oral   Oral  ?SpO2: 99% 97%  95%  ?Weight:   81.6 kg   ?Height:   '5\' 10"'$  (1.778 m)   ? ? ?General: appears to be stated age; alert, oriented ?Skin: warm, dry, no rash ?Head:  AT/[place] ?Mouth:  Oral mucosa membranes appear moist, normal dentition ?Neck: supple; trachea midline ?Heart:  RRR; did not appreciate any M/R/G ?Lungs: CTAB, did not appreciate any wheezes, rales, or rhonchi ?Abdomen: + BS; soft, ND, NT ?Vascular: 2+ pedal pulses b/l; 2+ radial pulses b/l ?Extremities: no peripheral edema, no  muscle wasting ?Neuro: strength and sensation intact in upper and lower extremities b/l ? ? ?Labs on Admission: I have personally reviewed following labs and imaging studies ? ?CBC: ?Recent Labs  ?Lab 08/29/21 ?1417 08/30/21 ?1932 08/31/21 ?0340  ?WBC 7.8 8.2 5.9  ?NEUTROABS 5.3  --  3.0  ?HGB 14.6 15.4* 12.1  ?HCT 42.7 45.5 36.6  ?MCV 87.0 87.7 90.1  ?PLT 321 287 231  ? ?Basic Metabolic Panel: ?Recent Labs  ?Lab 08/29/21 ?1417 08/30/21 ?1932 08/30/21 ?2203 08/31/21 ?0340  ?NA 138 138  --  138  ?K 3.1* 2.9*  --  3.7  ?CL 102 101  --  110  ?CO2 28 29  --  26  ?GLUCOSE 94 90  --  88  ?BUN 20 14  --  12  ?CREATININE 0.76 0.62  --  0.47  ?CALCIUM 9.0 9.3  --  7.4*  ?MG  --   --  2.2 1.7  ? ?GFR: ?Estimated Creatinine Clearance: 92 mL/min (by C-G formula based on SCr of 0.47 mg/dL). ?Liver Function Tests: ?Recent Labs  ?Lab 08/29/21 ?1417 08/30/21 ?1932 08/31/21 ?0340  ?AST 33 37 216*  ?ALT 39 48* 131*  ?ALKPHOS 99 102 85  ?BILITOT 0.7 0.7 0.2*  ?PROT 7.6 7.5 5.3*  ?ALBUMIN 4.5  4.4 3.1*  ? ?Recent Labs  ?Lab 08/30/21 ?1932  ?LIPASE 36  ? ?No results for input(s): AMMONIA in the last 168 hours. ?Coagulation Profile: ?No results for input(s): INR, PROTIME in the last 168 hours. ?Card

## 2021-08-31 NOTE — Plan of Care (Signed)
  Problem: Activity: Goal: Risk for activity intolerance will decrease Outcome: Progressing   Problem: Nutrition: Goal: Adequate nutrition will be maintained Outcome: Progressing   Problem: Coping: Goal: Level of anxiety will decrease Outcome: Progressing   

## 2021-08-31 NOTE — Assessment & Plan Note (Signed)
? ?#)   acquired hypothyroidism: documented h/o such, on Synthroid as outpatient.  In the context of presenting nausea/vomiting, will also check TSH. ? ?Plan: cont home Synthroid.  Check TSH. ? ?

## 2021-08-31 NOTE — Assessment & Plan Note (Signed)
? ?#)  Acute cystitis: Diagnosis on the basis of 2 to 3 days of dysuria, with original diagnosis of acute cystitis made on 08/29/2021 at which time patient presented to [facility] emergency department with urinalysis suggestive of UTI in the setting of significant pyuria, with urine culture collected at that time, with result currently pending.  Was discharged to home on oral antibiotics, but unable to tolerate p.o. in the interval due to intractable nausea/vomiting.  Started on Rocephin in the ED today.  Of note, no SIRS criteria met at this time for sepsis.  She is hemodynamically stable.  Renal ultrasound showed no evidence of obstructing ureterolithiasis nor any evidence of pyelonephritis. ? ?Plan: Follow-up result of the urine culture collected on 08/29/2021.  Continue Rocephin.  Repeat CMP and CBC in the morning.  Prn IV Zofran.  As needed IV Dilaudid. ? ? ?

## 2021-08-31 NOTE — Assessment & Plan Note (Signed)
? ? ?#)   GERD: documented h/o such; on omeprazole as outpatient.  ? ?Plan: In setting of presenting intractable nausea/vomiting and limited ability to tolerate p.o., will transiently transition her home oral omeprazole to daily IV Protonix. ? ? ? ?

## 2021-08-31 NOTE — Assessment & Plan Note (Signed)
? ? ?#)   Hypokalemia: Presenting serum potassium noted to be 2.9, in the context of recent increase in GI losses in the form of 3-4 episodes of nausea/vomiting over the course the last day, concomitant with diminished oral intake over that timeframe.  She received 40 mill equivalents of oral potassium chloride in the ED this evening. ? ?Plan: Potassium chloride 40 mill colons IV over 4 hours x 1 dose now.  Add on serum magnesium level.  Monitor telemetry.  Repeat CMP in the morning. ? ? ?

## 2021-09-01 DIAGNOSIS — E039 Hypothyroidism, unspecified: Secondary | ICD-10-CM | POA: Diagnosis not present

## 2021-09-01 DIAGNOSIS — I10 Essential (primary) hypertension: Secondary | ICD-10-CM | POA: Diagnosis not present

## 2021-09-01 DIAGNOSIS — N3000 Acute cystitis without hematuria: Secondary | ICD-10-CM | POA: Diagnosis not present

## 2021-09-01 DIAGNOSIS — K219 Gastro-esophageal reflux disease without esophagitis: Secondary | ICD-10-CM | POA: Diagnosis not present

## 2021-09-01 LAB — COMPREHENSIVE METABOLIC PANEL WITH GFR
ALT: 101 U/L — ABNORMAL HIGH (ref 0–44)
AST: 66 U/L — ABNORMAL HIGH (ref 15–41)
Albumin: 3.4 g/dL — ABNORMAL LOW (ref 3.5–5.0)
Alkaline Phosphatase: 97 U/L (ref 38–126)
Anion gap: 3 — ABNORMAL LOW (ref 5–15)
BUN: 16 mg/dL (ref 6–20)
CO2: 31 mmol/L (ref 22–32)
Calcium: 8.5 mg/dL — ABNORMAL LOW (ref 8.9–10.3)
Chloride: 105 mmol/L (ref 98–111)
Creatinine, Ser: 0.71 mg/dL (ref 0.44–1.00)
GFR, Estimated: >60 mL/min
Glucose, Bld: 95 mg/dL (ref 70–99)
Potassium: 3.7 mmol/L (ref 3.5–5.1)
Sodium: 139 mmol/L (ref 135–145)
Total Bilirubin: 0.5 mg/dL (ref 0.3–1.2)
Total Protein: 5.6 g/dL — ABNORMAL LOW (ref 6.5–8.1)

## 2021-09-01 LAB — CBC
HCT: 35.6 % — ABNORMAL LOW (ref 36.0–46.0)
Hemoglobin: 11.9 g/dL — ABNORMAL LOW (ref 12.0–15.0)
MCH: 30 pg (ref 26.0–34.0)
MCHC: 33.4 g/dL (ref 30.0–36.0)
MCV: 89.7 fL (ref 80.0–100.0)
Platelets: 207 10*3/uL (ref 150–400)
RBC: 3.97 MIL/uL (ref 3.87–5.11)
RDW: 12.7 % (ref 11.5–15.5)
WBC: 6.1 10*3/uL (ref 4.0–10.5)
nRBC: 0 % (ref 0.0–0.2)

## 2021-09-01 MED ORDER — PROCHLORPERAZINE EDISYLATE 10 MG/2ML IJ SOLN
10.0000 mg | Freq: Once | INTRAMUSCULAR | Status: AC
  Administered 2021-09-01: 10 mg via INTRAVENOUS
  Filled 2021-09-01: qty 2

## 2021-09-01 MED ORDER — SODIUM CHLORIDE 0.9 % IV SOLN
1.0000 g | INTRAVENOUS | Status: AC
  Administered 2021-09-01: 1 g via INTRAVENOUS
  Filled 2021-09-01: qty 10

## 2021-09-01 NOTE — Clinical Social Work Note (Signed)
?  Transition of Care (TOC) Screening Note ? ? ?Patient Details  ?Name: Yolanda Matthews ?Date of Birth: 25-Nov-1970 ? ? ?Transition of Care (TOC) CM/SW Contact:    ?Trish Mage, LCSW ?Phone Number: ?09/01/2021, 11:45 AM ? ? ? ?Transition of Care Department [facility]) has reviewed patient and no TOC needs have been identified at this time. We will continue to monitor patient advancement through interdisciplinary progression rounds. If new patient transition needs arise, please place a TOC consult. ? ? ?

## 2021-09-01 NOTE — Discharge Summary (Signed)
?Physician Discharge Summary ?  ?Patient: Yolanda Matthews MRN: [id number] DOB: 1970-09-11  ?Admit date:     08/30/2021  ?Discharge date: 09/01/21  ?Discharge Physician: Oswald Hillock  ? ?PCP: Lorenza Chick, MD  ? ?Recommendations at discharge:  ? ?Follow-up PCP in 1 week ? ?Discharge Diagnoses: ?Principal Problem: ?  UTI (urinary tract infection) ?Active Problems: ?  Hypothyroidism ?  Essential hypertension ?  Intractable nausea and vomiting ?  Hypokalemia ?  GERD (gastroesophageal reflux disease) ? ?Resolved Problems: ?  * No resolved hospital problems. * ? ?Hospital Course: ? ?51 year old female with a history of hypertension, acquired hypothyroidism, GERD who was admitted on 36 with acute cystitis and inability to tolerate p.o. also had nausea vomiting.  Patient said that she passed kidney stone few days ago came to this long with dysuria, she was diagnosed with acute cystitis and was started on Duricef and discharged home from the ED.  Patient developed nausea and vomiting after she took antibiotic.  She was not able to tolerate anything p.o. so she came back to the hospital. ?Urine cultures obtained and patient started on IV Rocephin. ? ?Assessment and Plan: ? ?Acute cystitis ?-Patient was diagnosed with acute cystitis recently and start started on antibiotics ?-Currently on IV ceftriaxone ?-Urine culture collected on 08/29/2021 only showed multiple species ?-Patient was treated with Rocephin for 3 days ; as patient was unable to take antibiotics p.o. due to intractable nausea and vomiting ?  ?Intractable nausea and vomiting ?-Resolved ?-Continue Zofran as needed ?  ?Transaminitis ?-Patient had significant elevation of AST and ALT  ?-AST 216, ALT 131; unclear etiology ?-LFTs have improved with AST 66, ALT 101 ?-We will need to follow-up recheck LFTs in office in 1 week ?-Abdominal ultrasound obtained shows suspected mild intra and extrahepatic biliary ductal dilatation, presumably secondary to postcholecystectomy  state and biliary reservoir phenomenon ? ?  ?Hypothyroidism ?-Continue Synthroid ?  ?Hypertension ?-Continue home medications ?  ? ? ?  ? ? ?Consultants:  ?Procedures performed:  ?Disposition: Home ?Diet recommendation:  ?Discharge Diet Orders (From admission, onward)  ? ?  Start     Ordered  ? 09/01/21 0000  Diet - low sodium heart healthy       ? 09/01/21 1640  ? ?  ?  ? ?  ? ?Regular diet ?DISCHARGE MEDICATION: ?Allergies as of 09/01/2021   ? ?   Reactions  ? Covid-19 (mrna) Vaccine Anaphylaxis  ? Epi pen ?Epi pen  ? JHERD-40 Sharyne Peach 5-11y Pfizer [covid-19 Mrna Vacc (moderna)] Anaphylaxis  ? Epi pen  ? Ketorolac Rash  ? Ketorolac Tromethamine Rash  ? Codeine   ? Other reaction(s): Other (See Comments) ?Unknown-Reported by parents as a child ?Reaction Unknown-Reported by parents as a child  ? Aminocaproic Acid Nausea And Vomiting  ? Other reaction(s): Dizziness  ? Demerol [meperidine Hcl] Hives, Itching, Palpitations  ? Meperidine Itching, Palpitations, Swelling, Hives  ? ?  ? ?  ?Medication List  ?  ? ?STOP taking these medications   ? ?acetaminophen 500 MG tablet ?Commonly known as: TYLENOL ?  ?IRON PO ?  ? ?  ? ?TAKE these medications   ? ?ACIDOPHILUS LACTOBACILLUS PO ?Take 1 tablet by mouth daily. ?  ?BARIATRIC MULTIVITAMINS/IRON PO ?Take 2 capsules by mouth daily. ?  ?BIOTIN PO ?Take 1 tablet by mouth daily. ?  ?buPROPion 150 MG 24 hr tablet ?Commonly known as: WELLBUTRIN XL ?Take 450 mg by mouth daily. ?  ?busPIRone 5 MG tablet ?Commonly  known as: BUSPAR ?Take 5 mg by mouth 2 (two) times daily. ?  ?Calcium 600-400 MG-UNIT Chew ?Chew 2 each by mouth daily. ?  ?D3 PO ?Take 1 capsule by mouth daily. ?  ?gabapentin 300 MG capsule ?Commonly known as: NEURONTIN ?Take 300 mg by mouth daily as needed (nerve pain). ?  ?hydrochlorothiazide 25 MG tablet ?Commonly known as: HYDRODIURIL ?Take 25 mg by mouth daily. ?  ?IRON SLOW RELEASE PO ?Take 1 tablet by mouth daily. ?  ?levothyroxine 112 MCG tablet ?Commonly known as:  SYNTHROID ?Take 112 mcg by mouth daily before breakfast. ?  ?lisinopril 20 MG tablet ?Commonly known as: ZESTRIL ?Take 20 mg by mouth daily. ?  ?omeprazole 20 MG capsule ?Commonly known as: PRILOSEC ?Take 20 mg by mouth daily. ?  ?oxyCODONE-acetaminophen 5-325 MG tablet ?Commonly known as: PERCOCET/ROXICET ?Take 1 tablet by mouth every 6 (six) hours as needed for severe pain. ?  ?POTASSIUM PO ?Take 1 tablet by mouth daily. ?  ?promethazine 25 MG tablet ?Commonly known as: PHENERGAN ?Take 1 tablet (25 mg total) by mouth every 6 (six) hours as needed for nausea or vomiting. ?  ?vitamin B-12 100 MCG tablet ?Commonly known as: CYANOCOBALAMIN ?Take 200 mcg by mouth daily. ?  ? ?  ? ? ?Discharge Exam: ?Filed Weights  ? 08/30/21 2330 08/31/21 1505 09/01/21 0500  ?Weight: 81.6 kg 82.5 kg 82.4 kg  ? ?General-appears in no acute distress ?Heart-S1-S2, regular, no murmur auscultated ?Lungs-clear to auscultation bilaterally, no wheezing or crackles auscultated ?Abdomen-soft, nontender, no organomegaly ?Extremities-no edema in the lower extremities ?Neuro-alert, oriented x3, no focal deficit noted ? ?Condition at discharge: good ? ?The results of significant diagnostics from this hospitalization (including imaging, microbiology, ancillary and laboratory) are listed below for reference.  ? ?Imaging Studies: ?US Renal ? ?Result Date: 08/30/2021 ?CLINICAL DATA:  Flank pain EXAM: RENAL / URINARY TRACT ULTRASOUND COMPLETE COMPARISON:  CT 08/29/2021 FINDINGS: Right Kidney: Renal measurements: 10.2 x 4.4 x 5.2 cm = volume: 124.1 mL. Echogenicity within normal limits. No mass or hydronephrosis visualized. Left Kidney: Renal measurements: 12.6 x 4.7 x 5.1 cm = volume: 156.3 mL. Echogenicity within normal limits. No mass or hydronephrosis visualized. Punctate echogenic focus at the lower pole measuring 2 mm likely corresponds to the CT demonstrated kidney stone Bladder: Appears normal for degree of bladder distention. Other: None.  IMPRESSION: 1. Negative for hydronephrosis. 2. Punctate left kidney stone Electronically Signed   By: Donavan Foil M.D.   On: 08/30/2021 22:50  ? ?US Abdomen Limited ? ?Result Date: 08/31/2021 ?CLINICAL DATA:  Transaminitis.  History of cholecystectomy. EXAM: ULTRASOUND ABDOMEN LIMITED RIGHT UPPER QUADRANT COMPARISON:  CT abdomen pelvis 08/29/2021; 07/13/2019 FINDINGS: Gallbladder: Surgically absent Common bile duct: Diameter: Enlarged measuring 0.9 cm in diameter, potentially secondary to post cholecystectomy state and biliary reservoir phenomena. Liver: Normal hepatic echogenicity. Normal hepatic contour. Suspected mild intrahepatic biliary dilatation. No discrete hepatic lesions. Portal vein is patent on color Doppler imaging with normal direction of blood flow towards the liver. Other: No ascites IMPRESSION: 1. Suspected mild intra and extrahepatic biliary ductal dilatation, presumably secondary to post cholecystectomy state and biliary reservoir phenomenon. If clinical concern persists, further evaluation with MRCP could be performed as indicated. 2. Otherwise, no explanation for patient's elevated LFTs. Electronically Signed   By: Sandi Mariscal M.D.   On: 08/31/2021 09:52  ? ?CT Renal Stone Study ? ?Result Date: 08/29/2021 ?CLINICAL DATA:  Flank pain, kidney stones suspected EXAM: CT ABDOMEN AND PELVIS WITHOUT CONTRAST TECHNIQUE:  Multidetector CT imaging of the abdomen and pelvis was performed following the standard protocol without IV contrast. RADIATION DOSE REDUCTION: This exam was performed according to the departmental dose-optimization program which includes automated exposure control, adjustment of the mA and/or kV according to patient size and/or use of iterative reconstruction technique. COMPARISON:  10/11/2020 FINDINGS: Lower chest: No acute abnormality. Hepatobiliary: No focal liver abnormality is seen. Status post cholecystectomy. No biliary dilatation. Pancreas: Unremarkable. No pancreatic ductal  dilatation or surrounding inflammatory changes. Spleen: Normal in size without significant abnormality. Adrenals/Urinary Tract: Adrenal glands are unremarkable. Small nonobstructive calculus of the inferior po
